# Patient Record
Sex: Female | Born: 1935 | Race: Black or African American | Hispanic: No | Smoking: Never smoker
Health system: Southern US, Community
[De-identification: ages and names within clinical notes are randomized; demographics above are authoritative.]

## PROBLEM LIST (undated history)

## (undated) DIAGNOSIS — E079 Disorder of thyroid, unspecified: Secondary | ICD-10-CM

## (undated) DIAGNOSIS — I10 Essential (primary) hypertension: Secondary | ICD-10-CM

## (undated) DIAGNOSIS — I739 Peripheral vascular disease, unspecified: Secondary | ICD-10-CM

## (undated) HISTORY — PX: CATARACT EXTRACTION: SUR2

## (undated) HISTORY — PX: APPENDECTOMY: SHX54

## (undated) HISTORY — PX: TONSILLECTOMY: SUR1361

## (undated) HISTORY — DX: Peripheral vascular disease, unspecified: I73.9

---

## 1998-10-29 ENCOUNTER — Other Ambulatory Visit: Admission: RE | Admit: 1998-10-29 | Discharge: 1998-10-29 | Payer: Self-pay | Admitting: Emergency Medicine

## 1999-02-20 ENCOUNTER — Encounter: Admission: RE | Admit: 1999-02-20 | Discharge: 1999-03-20 | Payer: Self-pay | Admitting: Orthopaedic Surgery

## 1999-06-16 ENCOUNTER — Inpatient Hospital Stay (HOSPITAL_COMMUNITY): Admission: EM | Admit: 1999-06-16 | Discharge: 1999-06-20 | Payer: Self-pay | Admitting: Emergency Medicine

## 1999-06-16 ENCOUNTER — Encounter: Payer: Self-pay | Admitting: Emergency Medicine

## 1999-10-27 ENCOUNTER — Encounter: Payer: Self-pay | Admitting: Emergency Medicine

## 1999-10-27 ENCOUNTER — Emergency Department (HOSPITAL_COMMUNITY): Admission: EM | Admit: 1999-10-27 | Discharge: 1999-10-27 | Payer: Self-pay | Admitting: Emergency Medicine

## 1999-10-28 ENCOUNTER — Other Ambulatory Visit: Admission: RE | Admit: 1999-10-28 | Discharge: 1999-10-28 | Payer: Self-pay | Admitting: Emergency Medicine

## 1999-10-31 ENCOUNTER — Encounter: Payer: Self-pay | Admitting: Emergency Medicine

## 1999-10-31 ENCOUNTER — Encounter: Admission: RE | Admit: 1999-10-31 | Discharge: 1999-10-31 | Payer: Self-pay | Admitting: Emergency Medicine

## 2000-06-02 ENCOUNTER — Inpatient Hospital Stay (HOSPITAL_COMMUNITY): Admission: AD | Admit: 2000-06-02 | Discharge: 2000-06-05 | Payer: Self-pay | Admitting: Emergency Medicine

## 2000-06-02 ENCOUNTER — Encounter: Payer: Self-pay | Admitting: Emergency Medicine

## 2000-06-03 ENCOUNTER — Encounter: Payer: Self-pay | Admitting: Emergency Medicine

## 2000-06-03 ENCOUNTER — Encounter: Payer: Self-pay | Admitting: General Surgery

## 2000-06-04 ENCOUNTER — Encounter: Payer: Self-pay | Admitting: Internal Medicine

## 2000-06-04 ENCOUNTER — Encounter: Payer: Self-pay | Admitting: Emergency Medicine

## 2000-06-21 ENCOUNTER — Encounter: Payer: Self-pay | Admitting: Emergency Medicine

## 2000-06-21 ENCOUNTER — Encounter: Admission: RE | Admit: 2000-06-21 | Discharge: 2000-06-21 | Payer: Self-pay | Admitting: Emergency Medicine

## 2000-06-23 ENCOUNTER — Encounter: Admission: RE | Admit: 2000-06-23 | Discharge: 2000-06-23 | Payer: Self-pay | Admitting: Emergency Medicine

## 2000-06-23 ENCOUNTER — Encounter: Payer: Self-pay | Admitting: Emergency Medicine

## 2000-10-28 ENCOUNTER — Other Ambulatory Visit: Admission: RE | Admit: 2000-10-28 | Discharge: 2000-10-28 | Payer: Self-pay | Admitting: Emergency Medicine

## 2000-11-01 ENCOUNTER — Encounter: Payer: Self-pay | Admitting: Emergency Medicine

## 2000-11-01 ENCOUNTER — Encounter: Admission: RE | Admit: 2000-11-01 | Discharge: 2000-11-01 | Payer: Self-pay | Admitting: Emergency Medicine

## 2001-11-02 ENCOUNTER — Encounter: Payer: Self-pay | Admitting: Emergency Medicine

## 2001-11-02 ENCOUNTER — Encounter: Admission: RE | Admit: 2001-11-02 | Discharge: 2001-11-02 | Payer: Self-pay | Admitting: Emergency Medicine

## 2002-02-13 ENCOUNTER — Encounter: Admission: RE | Admit: 2002-02-13 | Discharge: 2002-02-13 | Payer: Self-pay | Admitting: Emergency Medicine

## 2002-02-13 ENCOUNTER — Encounter: Payer: Self-pay | Admitting: Emergency Medicine

## 2002-03-23 ENCOUNTER — Encounter: Admission: RE | Admit: 2002-03-23 | Discharge: 2002-03-23 | Payer: Self-pay | Admitting: Gastroenterology

## 2002-03-23 ENCOUNTER — Encounter: Payer: Self-pay | Admitting: Gastroenterology

## 2002-04-12 ENCOUNTER — Ambulatory Visit (HOSPITAL_COMMUNITY): Admission: RE | Admit: 2002-04-12 | Discharge: 2002-04-12 | Payer: Self-pay | Admitting: Gastroenterology

## 2002-04-24 ENCOUNTER — Ambulatory Visit (HOSPITAL_COMMUNITY): Admission: RE | Admit: 2002-04-24 | Discharge: 2002-04-24 | Payer: Self-pay | Admitting: Gastroenterology

## 2002-08-02 ENCOUNTER — Ambulatory Visit (HOSPITAL_COMMUNITY): Admission: RE | Admit: 2002-08-02 | Discharge: 2002-08-02 | Payer: Self-pay | Admitting: Gastroenterology

## 2002-11-03 ENCOUNTER — Encounter: Payer: Self-pay | Admitting: Emergency Medicine

## 2002-11-03 ENCOUNTER — Encounter: Admission: RE | Admit: 2002-11-03 | Discharge: 2002-11-03 | Payer: Self-pay

## 2003-02-01 ENCOUNTER — Encounter: Admission: RE | Admit: 2003-02-01 | Discharge: 2003-02-01 | Payer: Self-pay | Admitting: Emergency Medicine

## 2003-02-01 ENCOUNTER — Encounter: Payer: Self-pay | Admitting: Emergency Medicine

## 2003-10-07 ENCOUNTER — Inpatient Hospital Stay (HOSPITAL_COMMUNITY): Admission: EM | Admit: 2003-10-07 | Discharge: 2003-10-09 | Payer: Self-pay | Admitting: Emergency Medicine

## 2003-10-25 ENCOUNTER — Encounter: Admission: RE | Admit: 2003-10-25 | Discharge: 2003-10-25 | Payer: Self-pay | Admitting: Emergency Medicine

## 2003-11-01 ENCOUNTER — Encounter: Admission: RE | Admit: 2003-11-01 | Discharge: 2003-11-01 | Payer: Self-pay | Admitting: Emergency Medicine

## 2004-11-14 ENCOUNTER — Encounter: Admission: RE | Admit: 2004-11-14 | Discharge: 2004-11-14 | Payer: Self-pay | Admitting: Emergency Medicine

## 2005-01-17 ENCOUNTER — Emergency Department (HOSPITAL_COMMUNITY): Admission: EM | Admit: 2005-01-17 | Discharge: 2005-01-17 | Payer: Self-pay | Admitting: Emergency Medicine

## 2005-04-14 ENCOUNTER — Encounter: Admission: RE | Admit: 2005-04-14 | Discharge: 2005-04-14 | Payer: Self-pay | Admitting: Emergency Medicine

## 2005-08-07 ENCOUNTER — Encounter: Admission: RE | Admit: 2005-08-07 | Discharge: 2005-08-07 | Payer: Self-pay | Admitting: Emergency Medicine

## 2006-01-25 ENCOUNTER — Encounter: Admission: RE | Admit: 2006-01-25 | Discharge: 2006-01-25 | Payer: Self-pay | Admitting: Emergency Medicine

## 2006-02-08 ENCOUNTER — Encounter: Admission: RE | Admit: 2006-02-08 | Discharge: 2006-02-08 | Payer: Self-pay | Admitting: Orthopedic Surgery

## 2006-02-10 ENCOUNTER — Ambulatory Visit (HOSPITAL_BASED_OUTPATIENT_CLINIC_OR_DEPARTMENT_OTHER): Admission: RE | Admit: 2006-02-10 | Discharge: 2006-02-11 | Payer: Self-pay | Admitting: Orthopedic Surgery

## 2006-07-01 ENCOUNTER — Encounter: Admission: RE | Admit: 2006-07-01 | Discharge: 2006-07-01 | Payer: Self-pay | Admitting: Emergency Medicine

## 2006-09-02 ENCOUNTER — Ambulatory Visit (HOSPITAL_BASED_OUTPATIENT_CLINIC_OR_DEPARTMENT_OTHER): Admission: RE | Admit: 2006-09-02 | Discharge: 2006-09-02 | Payer: Self-pay | Admitting: Orthopedic Surgery

## 2006-10-26 ENCOUNTER — Encounter: Admission: RE | Admit: 2006-10-26 | Discharge: 2006-10-26 | Payer: Self-pay | Admitting: Emergency Medicine

## 2006-11-02 ENCOUNTER — Encounter: Admission: RE | Admit: 2006-11-02 | Discharge: 2006-11-02 | Payer: Self-pay | Admitting: Emergency Medicine

## 2007-01-28 ENCOUNTER — Encounter: Admission: RE | Admit: 2007-01-28 | Discharge: 2007-01-28 | Payer: Self-pay | Admitting: Emergency Medicine

## 2007-02-18 ENCOUNTER — Encounter: Admission: RE | Admit: 2007-02-18 | Discharge: 2007-02-18 | Payer: Self-pay | Admitting: Emergency Medicine

## 2007-10-07 ENCOUNTER — Encounter: Admission: RE | Admit: 2007-10-07 | Discharge: 2007-10-07 | Payer: Self-pay | Admitting: Emergency Medicine

## 2007-10-28 ENCOUNTER — Encounter: Admission: RE | Admit: 2007-10-28 | Discharge: 2007-10-28 | Payer: Self-pay | Admitting: Emergency Medicine

## 2008-03-12 ENCOUNTER — Encounter: Admission: RE | Admit: 2008-03-12 | Discharge: 2008-03-12 | Payer: Self-pay | Admitting: Emergency Medicine

## 2008-03-28 ENCOUNTER — Encounter: Admission: RE | Admit: 2008-03-28 | Discharge: 2008-03-28 | Payer: Self-pay | Admitting: Emergency Medicine

## 2008-11-19 ENCOUNTER — Encounter: Admission: RE | Admit: 2008-11-19 | Discharge: 2008-11-19 | Payer: Self-pay | Admitting: Emergency Medicine

## 2009-04-17 ENCOUNTER — Encounter: Admission: RE | Admit: 2009-04-17 | Discharge: 2009-04-17 | Payer: Self-pay | Admitting: Family Medicine

## 2009-05-17 ENCOUNTER — Emergency Department (HOSPITAL_COMMUNITY): Admission: EM | Admit: 2009-05-17 | Discharge: 2009-05-17 | Payer: Self-pay | Admitting: Emergency Medicine

## 2009-05-27 ENCOUNTER — Encounter: Admission: RE | Admit: 2009-05-27 | Discharge: 2009-05-27 | Payer: Self-pay | Admitting: Family Medicine

## 2009-09-16 ENCOUNTER — Encounter: Admission: RE | Admit: 2009-09-16 | Discharge: 2009-09-16 | Payer: Self-pay | Admitting: Family Medicine

## 2010-10-17 ENCOUNTER — Encounter: Admission: RE | Admit: 2010-10-17 | Discharge: 2010-10-17 | Payer: Self-pay | Admitting: Family Medicine

## 2010-11-11 ENCOUNTER — Encounter
Admission: RE | Admit: 2010-11-11 | Discharge: 2010-11-11 | Payer: Self-pay | Source: Home / Self Care | Attending: Family Medicine | Admitting: Family Medicine

## 2010-12-06 ENCOUNTER — Encounter: Payer: Self-pay | Admitting: Emergency Medicine

## 2010-12-07 ENCOUNTER — Encounter: Payer: Self-pay | Admitting: Emergency Medicine

## 2011-03-02 ENCOUNTER — Emergency Department (HOSPITAL_COMMUNITY): Payer: Medicare Other

## 2011-03-02 ENCOUNTER — Inpatient Hospital Stay (HOSPITAL_COMMUNITY)
Admission: EM | Admit: 2011-03-02 | Discharge: 2011-03-09 | DRG: 558 | Disposition: A | Discharge status: Skilled Nursing Facility | Payer: Medicare Other | Attending: Internal Medicine | Admitting: Internal Medicine

## 2011-03-02 DIAGNOSIS — G252 Other specified forms of tremor: Secondary | ICD-10-CM | POA: Diagnosis present

## 2011-03-02 DIAGNOSIS — E119 Type 2 diabetes mellitus without complications: Secondary | ICD-10-CM | POA: Diagnosis present

## 2011-03-02 DIAGNOSIS — R112 Nausea with vomiting, unspecified: Secondary | ICD-10-CM | POA: Diagnosis present

## 2011-03-02 DIAGNOSIS — E039 Hypothyroidism, unspecified: Secondary | ICD-10-CM | POA: Diagnosis present

## 2011-03-02 DIAGNOSIS — M6282 Rhabdomyolysis: Principal | ICD-10-CM | POA: Diagnosis present

## 2011-03-02 DIAGNOSIS — E871 Hypo-osmolality and hyponatremia: Secondary | ICD-10-CM | POA: Diagnosis present

## 2011-03-02 DIAGNOSIS — E7800 Pure hypercholesterolemia, unspecified: Secondary | ICD-10-CM | POA: Diagnosis present

## 2011-03-02 DIAGNOSIS — G250 Essential tremor: Secondary | ICD-10-CM | POA: Diagnosis present

## 2011-03-02 DIAGNOSIS — E785 Hyperlipidemia, unspecified: Secondary | ICD-10-CM | POA: Diagnosis present

## 2011-03-02 DIAGNOSIS — R5381 Other malaise: Secondary | ICD-10-CM | POA: Diagnosis present

## 2011-03-02 LAB — COMPREHENSIVE METABOLIC PANEL
AST: 91 U/L — ABNORMAL HIGH (ref 0–37)
Albumin: 3.2 g/dL — ABNORMAL LOW (ref 3.5–5.2)
Alkaline Phosphatase: 47 U/L (ref 39–117)
BUN: 17 mg/dL (ref 6–23)
CO2: 20 mEq/L (ref 19–32)
Chloride: 96 mEq/L (ref 96–112)
Potassium: 3.8 mEq/L (ref 3.5–5.1)
Total Bilirubin: 0.7 mg/dL (ref 0.3–1.2)

## 2011-03-02 LAB — DIFFERENTIAL
Basophils Relative: 0 % (ref 0–1)
Eosinophils Absolute: 0.2 10*3/uL (ref 0.0–0.7)
Lymphocytes Relative: 17 % (ref 12–46)
Lymphs Abs: 0.9 10*3/uL (ref 0.7–4.0)
Monocytes Absolute: 0.6 10*3/uL (ref 0.1–1.0)
Neutro Abs: 3.5 10*3/uL (ref 1.7–7.7)

## 2011-03-02 LAB — CBC
MCH: 32.3 pg (ref 26.0–34.0)
MCHC: 35.7 g/dL (ref 30.0–36.0)
MCV: 90.4 fL (ref 78.0–100.0)
Platelets: 112 10*3/uL — ABNORMAL LOW (ref 150–400)
RBC: 4.58 MIL/uL (ref 3.87–5.11)

## 2011-03-02 LAB — POCT CARDIAC MARKERS: Troponin i, poc: <0.05 ng/mL (ref 0.00–0.09)

## 2011-03-03 LAB — URINALYSIS, ROUTINE W REFLEX MICROSCOPIC
Bilirubin Urine: NEGATIVE
Glucose, UA: NEGATIVE mg/dL
Leukocytes, UA: NEGATIVE
Nitrite: NEGATIVE
Protein, ur: 30 mg/dL — AB
Specific Gravity, Urine: 1.024 (ref 1.005–1.030)
Urobilinogen, UA: 0.2 mg/dL (ref 0.0–1.0)
pH: 5.5 (ref 5.0–8.0)

## 2011-03-03 LAB — URINE MICROSCOPIC-ADD ON

## 2011-03-03 LAB — CARDIAC PANEL(CRET KIN+CKTOT+MB+TROPI)
CK, MB: 5.4 ng/mL — ABNORMAL HIGH (ref 0.3–4.0)
CK, MB: 6.4 ng/mL (ref 0.3–4.0)
CK, MB: 6.2 ng/mL (ref 0.3–4.0)
Relative Index: 0.2 (ref 0.0–2.5)
Relative Index: 0.3 (ref 0.0–2.5)
Total CK: 2093 U/L — ABNORMAL HIGH (ref 7–177)
Troponin I: 0.03 ng/mL (ref 0.00–0.06)

## 2011-03-03 LAB — GLUCOSE, CAPILLARY
Glucose-Capillary: 139 mg/dL — ABNORMAL HIGH (ref 70–99)
Glucose-Capillary: 165 mg/dL — ABNORMAL HIGH (ref 70–99)
Glucose-Capillary: 150 mg/dL — ABNORMAL HIGH (ref 70–99)

## 2011-03-04 LAB — BASIC METABOLIC PANEL
CO2: 20 mEq/L (ref 19–32)
Chloride: 105 mEq/L (ref 96–112)
GFR calc Af Amer: >60 mL/min (ref >60)
Glucose, Bld: 174 mg/dL — ABNORMAL HIGH (ref 70–99)
Sodium: 132 mEq/L — ABNORMAL LOW (ref 135–145)

## 2011-03-04 LAB — GLUCOSE, CAPILLARY

## 2011-03-04 LAB — CBC
HCT: 36.7 % (ref 36.0–46.0)
Hemoglobin: 12.6 g/dL (ref 12.0–15.0)
MCH: 31.3 pg (ref 26.0–34.0)
RBC: 4.02 MIL/uL (ref 3.87–5.11)

## 2011-03-04 LAB — ANA: Anti Nuclear Antibody(ANA): POSITIVE — AB

## 2011-03-04 LAB — URINE CULTURE: Culture: NO GROWTH

## 2011-03-04 LAB — ANTI-NUCLEAR AB-TITER (ANA TITER): ANA Titer 1: NEGATIVE

## 2011-03-05 LAB — GLUCOSE, CAPILLARY: Glucose-Capillary: 172 mg/dL — ABNORMAL HIGH (ref 70–99)

## 2011-03-05 LAB — BASIC METABOLIC PANEL
Chloride: 112 mEq/L (ref 96–112)
GFR calc Af Amer: >60 mL/min (ref >60)
Potassium: 4.4 mEq/L (ref 3.5–5.1)
Sodium: 136 mEq/L (ref 135–145)

## 2011-03-05 LAB — CK TOTAL AND CKMB (NOT AT ARMC)
CK, MB: 3.3 ng/mL (ref 0.3–4.0)
Relative Index: 0.7 (ref 0.0–2.5)
Total CK: 496 U/L — ABNORMAL HIGH (ref 7–177)

## 2011-03-06 LAB — GLUCOSE, CAPILLARY
Glucose-Capillary: 146 mg/dL — ABNORMAL HIGH (ref 70–99)
Glucose-Capillary: 124 mg/dL — ABNORMAL HIGH (ref 70–99)

## 2011-03-06 LAB — HEMOGLOBIN A1C: Hgb A1c MFr Bld: 8 % — ABNORMAL HIGH (ref <5.7)

## 2011-03-06 NOTE — H&P (Signed)
[name] Colon, TRILLO NO.:  [id number]  MEDICAL RECORD NO.:  [id number]           PATIENT TYPE:  E  LOCATION:  WLED                         FACILITY:  [facility]  PHYSICIAN:  Houston Siren, MD           DATE OF BIRTH:  [birth year]  DATE OF ADMISSION:  03/02/2011 DATE OF DISCHARGE:                             HISTORY & PHYSICAL   PRIMARY CARE PHYSICIAN:  Maryelizabeth Rowan, MD  ADVANCE DIRECTIVES:  Full code.  REASON FOR ADMISSION:  Feeling weak.  HISTORY OF PRESENT ILLNESS:  This is an 77 year old female with history of diabetes, hypothyroidism, hypercholesterolemia, and tremors, who presents to the emergency room at [facility] with nausea, vomiting, feeling weak.  Apparently, she saw her primary care physician and was placed on 2 antibiotics, the latest one is Bactrim.  She could not tell the reason as to why she was on antibiotic and reports that she was not told what infection she had.  She had been feeling more weak with malaise, decreased in appetite, having nausea and vomiting since she started taking the Bactrim.  She denied any headache, sore throat, earache, abdominal cramps or pain, diarrhea, or any dysuria.  Evaluation in the emergency room showed a serum sodium of 128, potassium of 3.8, blood sugar of 116, creatinine of 1.01.  Her urinalysis is negative, and her chest x- ray showed no infiltrate.  Furthermore, she has normal white count of 5200 and a hemoglobin of 14.8.  Her EKG showed Q-waves in II, III, and aVF.  I noted that her platelet count is 112,000.  Hospitalist was asked to admit the patient because of her feeling weak, having nausea.  PAST MEDICAL HISTORY:  Diabetes, hypothyroidism, hypercholesterolemia, tremor.  CURRENT MEDICATIONS: 1. Simvastatin 20 mg once a day. 2. Bactrim. 3. Levothyroxine 88 mcg per day. 4. Gabapentin. 5. Potassium chloride. 6. Torsemide. 7. Primidone 250 mg b.i.d. 8. Colace.  REVIEW OF SYSTEMS:  Otherwise,  unremarkable.  SOCIAL HISTORY:  She is not a smoker.  She denied any alcohol use.  Her son lives with her.  PHYSICAL EXAMINATION:  VITAL SIGNS:  Blood pressure 130/46, pulse of 95, respiratory rate of 18, temperature 101.7. GENERAL:  She is alert and oriented and is in no apparent distress. HEENT:  She has facial asymmetry and her speech is fluent.  Tongue is midline. NECK:  No cervical adenopathy.  Neck is supple.  No stridor. CARDIAC:  Revealed S1 and S2, regular.  I did not hear any murmur, rub, or gallop. LUNGS:  Clear.  No wheezes, rales, or any evidence of consolidation. ABDOMEN:  Soft, nondistended, nontender.  No rebound.  Bowel sounds present. EXTREMITIES:  With no edema.  No calf tenderness.  She has good distal pulses bilaterally. SKIN:  Slightly moist. NEUROLOGIC:  Nonfocal. PSYCHIATRIC:  Unremarkable as well.  OBJECTIVE FINDINGS:  EKG shows sinus rhythm with Q-waves in inferior leads.  Serum sodium 128, potassium 3.8, glucose of 160, BUN of 17, creatinine 1.01, calcium of 8.0.  White count of 5200, hemoglobin of 14.8, MCV of 91, platelet count of 112,000.  Urinalysis is negative. Chest  x-ray showed no definite infiltrate.  IMPRESSION:  This is an 77 year old female with feeling malaise, clinically dehydrated, having nausea and vomiting after taking Bactrim. I think that it is possible she has side effects from the Bactrim, manifested with nausea, vomiting, decreased in appetite, and decreased in platelet count.  She will need intravenous fluid.  We will hold off on any other antibiotic for now.  Because of the the EKG showing inferior Q- waves, we will cycle her cardiac enzymes as well.  We will continue her Synthroid supplement and check her TSH.  I would like to continue all her medications except for torsemide and Bactrim once they are reconciled.  She is a full code and will be admitted to [facility].  PROBLEM LIST: 1. Nausea, vomiting, question of side effect from  Bactrim. 2. Dehydration. 3. Malaise. 4. Diabetes. 5. Hypothyroidism. 6. Tremor. 7. Hypercholesterolemia. 8. Abnormal EKG (inferior Qs). 9. Borderline thrombocytopenia. 10.Full code.     Houston Siren, MD     PL/MEDQ  D:  03/03/2011  T:  03/03/2011  Job:  [id number]  Electronically Signed by Houston Siren  on 03/06/2011 05:14:35 AM

## 2011-03-07 LAB — GLUCOSE, CAPILLARY: Glucose-Capillary: 116 mg/dL — ABNORMAL HIGH (ref 70–99)

## 2011-03-08 LAB — GLUCOSE, CAPILLARY
Glucose-Capillary: 129 mg/dL — ABNORMAL HIGH (ref 70–99)
Glucose-Capillary: 184 mg/dL — ABNORMAL HIGH (ref 70–99)

## 2011-03-09 LAB — GLUCOSE, CAPILLARY
Glucose-Capillary: 138 mg/dL — ABNORMAL HIGH (ref 70–99)
Glucose-Capillary: 120 mg/dL — ABNORMAL HIGH (ref 70–99)

## 2011-03-14 NOTE — Discharge Summary (Signed)
NAME:  [name] Colon, [name] Colon             ACCOUNT NO.:  [id number]  MEDICAL RECORD NO.:  [id number]           PATIENT TYPE:  I  LOCATION:  1430                         FACILITY:  [facility]  PHYSICIAN:  Kathlen Mody, MD       DATE OF BIRTH:  10/09/1920  DATE OF ADMISSION:  03/02/2011 DATE OF DISCHARGE:  03/06/2011                        DISCHARGE SUMMARY - REFERRING   DISCHARGE DIAGNOSES: 1. Generalized weakness secondary to rhabdomyolysis. 2. Hyperlipidemia. 3. Hypothyroidism. 4. Diabetes mellitus. 5. Essential tremors. 6. Hyponatremia.  DISCHARGE MEDICATIONS: 1. Tylenol 500 mg 1 tablet q.6 h as needed. 2. Gabapentin 100 mg p.o. at bedtime. 3. Primidone 250 mg p.o. twice a day. 4. Colace 100 mg twice a day. 5. Torsemide 1 tablet daily. 6. Potassium 20 mEq 1 tablet daily. 7. Levothyroxine 88 mcg 1 tablet daily. 8. Metformin 500 mg daily.  PERTINENT LABS:  On admission, the patient had a comprehensive metabolic panel significant for a sodium of 128, potassium of 3.8, chloride of 96, bicarb of 20, glucose of 160, BUN 7, creatinine 1.01. Alkaline phosphatase 47, AST of 91, ALT of 42. CBC significant for platelets of 112. Urinalysis showed negative nitrites and leukocytes. Creatine kinase of 2372.  CK-MB of 5.4.  TSH within normal limits. CK level is 1862. Urine culture is negative. On the day of discharge, the patient's sodium is 136, potassium is 4.4, chloride 112, bicarb 23, BUN of 7, creatinine of 0.49. CK of 496, CK-MB of 3.3.  Hemoglobin A1c of 8.  RADIOLOGY:  The patient had an x-ray which showed COPD/emphysema, no acute cardiopulmonary disease.  A new nodule in the medial right lung apex.  A CT chest with contrast was suggested in the future for further evaluation.  BRIEF HOSPITAL COURSE:  This is an 77 year old lady with history of diabetes, hyperlipidemia, who was admitted for generalized weakness and nausea and vomiting.  Nausea and vomiting were probably secondary to  the Bactrim that she was taking for reasons unknown.  Over the course of her hospitalization, nausea and vomiting have resolved.  The patient was started on IV fluids.  She was not given any antibiotics.  The urine was checked for any infection.  Urine culture came back negative.  Generalized weakness, most likely secondary to dehydration giving rise to rhabdomyolysis.  She was on IV fluids.  Her creatine kinase has dropped from 2000 to 400s and her weakness has improved dramatically.  Hypothyroidism:  Her TSH was within normal limits and she was continued on her Synthroid supplements.  Hyponatremia, most likely secondary to dehydration:  Dehydration and hyponatremia have resolved after IV fluids were given.  Diabetes:  The patient is not on any antidiabetic medications at this time.  She was on sliding scale while she was in the hospital.  She will be started on metformin 500 mg daily.  Tremors:  The patient is on primidone; we will continue the same.  Hypercholesterolemia/hyperlipidemia:  The patient was on simvastatin which was stopped secondary to rhabdomyolysis, which can be restarted later in the future by the patient's PCP.  Abnormal chest x-ray:  A nodule in the medial right lung apex.  CT  of the chest with contrast will have to be done in the future for further evaluation as outpatient.  PHYSICAL EXAM ON DAY OF DISCHARGE:  VITAL SIGNS:  Include temperature of 97.9, pulse of 78 per minute, respirations 18 per minute, blood pressure 157/72, saturating 96% on room air.  GENERAL:  She is alert, afebrile, comfortable in no acute distress.  CARDIOVASCULAR: S1, S2 heard. RESPIRATORY:  Good air entry bilateral.  ABDOMEN:  Soft, nontender, nondistended.  EXTREMITIES: No pedal edema.  NEUROLOGIC:  Nonfocal at this time.  DISPOSITION:  The patient is hemodynamically stable for discharge to short-term SNF.  FOLLOWUP:  With PCP in 1 to 2 weeks or MD physician at SNF.  Follow  up the nodule in the lung with a CT of the chest with contrast for further evaluation as outpatient at PCP.          ______________________________ Kathlen Mody, MD     VA/MEDQ  D:  03/06/2011  T:  03/06/2011  Job:  [id number]  Electronically Signed by Kathlen Mody MD on 03/14/2011 01:31:31 PM

## 2011-03-31 ENCOUNTER — Other Ambulatory Visit: Payer: Self-pay | Admitting: Nurse Practitioner

## 2011-03-31 DIAGNOSIS — R911 Solitary pulmonary nodule: Secondary | ICD-10-CM

## 2011-04-03 NOTE — Procedures (Signed)
[place]  Patient:    [name] Colon, [name] Colon Visit Number: [id number] MRN: [id number]          Service Type: END Location: ENDO Attending Physician:  Charna Wendelin Dictated by:   Anselmo Rod, M.D. Proc. Date: 04/12/02 Admit Date:  04/12/2002   CC:         Reuben Likes, M.D.   Procedure Report  DATE OF BIRTH:  December 18, 1919  REFERRING PHYSICIAN:  Reuben Likes, M.D.  PROCEDURE PERFORMED:  Esophagogastroduodenoscopy.  ENDOSCOPIST:  Anselmo Rod, M.D.  INSTRUMENT USED:  Olympus video panendoscope.  INDICATIONS FOR PROCEDURE:  Epigastric pain with retrosternal regurgitation and discomfort in an 77 year old African-American female with a normal barium swallow.  Rule out peptic ulcer disease, esophagitis, gastritis, etc.  PREPROCEDURE PREPARATION:  Informed consent was procured from the patient. The patient was fasted for eight hours prior to the procedure.  PREPROCEDURE PHYSICAL:  The patient had stable vital signs.  Neck supple. Chest clear to auscultation.  S1, S2 regular.  Abdomen soft with normal abdominal bowel sounds.  DESCRIPTION OF PROCEDURE:  The patient was placed in left lateral decubitus position and sedated with 40 mg of Demerol and 5 mg of Versed intravenously. Once the patient was adequately sedated and maintained on low-flow oxygen and continuous cardiac monitoring, the Olympus video panendoscope was advanced through the mouthpiece, over the tongue, into the esophagus under direct vision.  The entire esophagus appeared normal and without lesion.  On advancing the scope into the stomach.  There was a small hiatal hernia seen on high retroflexion.  The rest of the gastric mucosa and the proximal small bowel appeared normal.  IMPRESSION:  Normal esophagogastroduodenoscopy except for small hiatal hernia.  RECOMMENDATION: 1. A 24 hour pH study and esophageal manometry will be done. 2. Patient is to discontinue use of  Prevacid as this has caused diarrhea and    not helped her symptoms at all. 3. Further recommendations made after the above-mentioned tests have been    done. Dictated by:   Anselmo Rod, M.D. Attending Physician:  Charna Adaora DD:  04/12/02 TD:  04/13/02 Job: [id number]

## 2011-04-03 NOTE — Op Note (Signed)
NAME:  [name] Colon, [name] Colon             ACCOUNT NO.:  [id number]   MEDICAL RECORD NO.:  [id number]          PATIENT TYPE:  AMB   LOCATION:  DSC                          FACILITY:  [facility]   PHYSICIAN:  Dyke Brackett, M.D.    DATE OF BIRTH:  Dec 14, 1919   DATE OF PROCEDURE:  02/10/2006  DATE OF DISCHARGE:  02/10/2006                                 OPERATIVE REPORT   PREOPERATIVE DIAGNOSES:  1.  Complete interstitial tear infraspinatus.  2.  Impingement.  3.  Acromioclavicular joint arthritis.  4.  Degenerative tearing, anterior superior labrum and early degenerative      change glenohumeral joint.   POSTOPERATIVE DIAGNOSES:  1.  Complete interstitial tear infraspinatus.  2.  Impingement.  3.  Acromioclavicular joint arthritis.  4.  Degenerative tearing, anterior superior labrum and early degenerative      change glenohumeral joint.   OPERATION PERFORMED:  1.  Arthroscopic acromioplasty.  2.  Arthroscopic debridement, torn labrum.  3.  Arthroscopic excision, distal clavicle.  4.  Mini open rotator cuff repair.   SURGEON:  Dyke Brackett, M.D.   ASSISTANT:  Arlys John D. Petrarca, P.A.-C.   ESTIMATED BLOOD LOSS:  Minimal.   ANESTHESIA:   INDICATIONS FOR PROCEDURE:  She is an 77 year old with MRI proven cuff tear  thought to be amenable to outpatient surgery.   DESCRIPTION OF PROCEDURE:  Arthroscope through posterior, lateral and  anterior portal.  On systematic inspection of the shoulder, I initially  thought that there was no complete cuff tear although there was a  longitudinal split in the infraspinatus.  There was a lot of friable  synovium in the shoulder which was debrided and early degenerative change  which was debrided.  Biceps tendon anchor intact.  There was a partial  actually subscapularis tear which was debrided as well with some  calcification of the subscapularis or crystalline material.   Subacromial space was hypertrophied and inflamed with moderately severe  impingement which was relieved with laparoscopic acromioplasty.  There was  severe acromioclavicular arthritis that was relieved with 1 to 1.5 cm distal  excision.  The superior surface of the cuff appeared to be intact on the  anterior leading edge with MRI report but there was a longitudinal split  that was visible at the conclusion of the bony procedures and a mini open  rotator cuff  repair done for enlargement of the lateral portal with placement of two #2  FiberWires for repair of the longitudinal tear.  Good snug repair was  obtained.  Closure was effected with 0 and 2-0 Vicryl.  Portals were closed  with nylon and lightly compressive sterile dressing applied.  Marcaine  infiltrated in the wound.      Dyke Brackett, M.D.  Electronically Signed     WDC/MEDQ  D:  02/10/2006  T:  02/12/2006  Job:  [id number]

## 2011-04-03 NOTE — Op Note (Signed)
[name], [name] Colon NO.:  [id number]   MEDICAL RECORD NO.:  [id number]          PATIENT TYPE:  AMB   LOCATION:  DSC                          FACILITY:  [facility]   PHYSICIAN:  Loreta Ave, M.D. DATE OF BIRTH:  1920/02/14   DATE OF PROCEDURE:  09/02/2006  DATE OF DISCHARGE:                                 OPERATIVE REPORT   PREOPERATIVE DIAGNOSES:  Left shoulder persistent impingement, status post  rotator cuff repair with recurrent tearing, loose bioabsorbable anchors and  sutures.   POSTOPERATIVE DIAGNOSES:  Left shoulder persistent impingement, status post  rotator cuff repair with recurrent tearing, loose bioabsorbable anchors and  sutures, with not complete, but still recurrent partial cuff tear; marked  postoperative adhesions; persistent anterior bony impingement from acromion;  long head biceps tendon chronically absent; grade 2 changes of glenoid and  humerus.   PROCEDURES:  Shoulder exam under anesthesia, arthroscopy, debridement of  glenohumeral joint, subacromial decompression with extensive lysis and  debridement of adhesions, removal of two retained FiberWire sutures and  remnants of anchor, debridement of recurrent extensive, at least partial-  thickness cuff tear, revision of acromioplasty.   SURGEON:  Loreta Ave, M.D.   ASSISTANT:  Genene Churn. Denton Meek.   ANESTHESIA:  General.   BLOOD LOSS:  Minimal.   SPECIMENS:  None.   CULTURES:  None.   COMPLICATIONS:  None.   DRESSING:  Soft compressive sling.   DESCRIPTION OF PROCEDURES:  The patient was brought to the operating room  and placed on the operating table in supine position.  After adequate  anesthesia had been obtained, the left shoulder examined.  Very tight, very  stiff, but I could gastric tube it through full motion.  No instability.  Placed in a beach-chair position in a shoulder positioner and prepped and  draped in the usual sterile fashion.  Three portals  created, anterior,  posterior and lateral.  Shoulder entered with a blunt obturator and  distended.  Arthroscope introduced and the shoulder inspected.  Some grade 2  and 3 changes, mostly on the humerus, debrided.  Long head of biceps  completely absent; this was chronic, not acute.  Reactive synovitis and some  adhesions debrided.  Reestablishing inferior recess.  Labrum intact.  Undersurface of the cuff still had a capsular layer intact over the crescent  region, and the cable was still anchored.  Not a full-thickness tear from  below, but very significant partial-thickness.  Cannula redirected  subacromially.  Marked adhesions throughout.  A lot of time spent just  clearing up adhesions to identify structures.  The 2 retained FiberWire  sutures, which had ripped off the humerus, were freed up and had all soft  tissue removed, with a few little small fragments of a bioabsorbable anchor.  No other free anchor seen within the whole space.  The humeral tuberosity  well identified.  The cuff had a functional recurrent full-thickness tear,  but there was still a capsular layer intact over the entire crescent, and  the cable was still attached.  Given the previous history, as well as the  patient's  age, I felt debridement was the best approach to take at this  point in time.  Acromion was identified, and there was recurrent spurring  anteriorly.  Revision acromioplasty to a flat, upturned acromion in the  front, taking a little off the front to prevent bony impingement in the  front and rerelease the CA ligament.  Distal clavicle adequate excision.  A  little prominence of the acromion at the [facility] joint smoothed off with a bur.  At completion, the entire subacromial space accessed, viewed from all  portals to be sure all debris, the anchors and FiberWire removed.  The cuff  assessed, and, again, did not feel that further attempts at open repair  should the eye done.  Thoroughly irrigated.   Instruments and fluid removed.  Portals were closed with nylon.  Sterile compressive dressing applied.  Sling applied.  Anesthesia reversed.  Brought to the recovery room.  Tolerated surgery well with no complications.      Loreta Ave, M.D.  Electronically Signed     DFM/MEDQ  D:  09/02/2006  T:  09/03/2006  Job:  [id number]

## 2011-04-03 NOTE — Discharge Summary (Signed)
[place]. [facility]  Patient:    [name] Colon, [name] Colon                      MRN: [id number] Adm. Date:  [id number] Attending:  Roque Lias                           Discharge Summary  No dictation. DD:  06/02/00 TD:  06/03/00 Job: [id number]

## 2011-04-03 NOTE — Discharge Summary (Signed)
[place]. [facility]  Patient:    [name] Colon, [name] Colon                      MRN: [id number] Adm. Date:  [id number] Disc. Date: [id number] Attending:  Roque Lias                           Discharge Summary  SUMMARY OF HISTORY AND PHYSICAL:  The patient is a 77 year old female who has a four-day history of severe (9/10) stabbing right lower quadrant pain radiating to the right anterior thigh.  This has been associated with chills, nausea and frequent urination but no fever, vomiting, dysuria, hematuria, diarrhea, blood in the stool or GYN symptoms.  The patient stated that the pain was worse when lying down and somewhat better when she would sit up or stand up.  The pain got a little bit better after the onset but then is worse again. She does have a history of kidney stones and diverticulitis and has had an appendectomy.  A urinalysis in our office was within normal limits.  PHYSICAL EXAMINATION:  GENERAL APPEARANCE:  She was alert, but moaning and rocking with discomfort.  VITAL SIGNS:  Blood pressure 132/80, pulse 80 and regular, respiratory rate 20, temperature 97.0.  SKIN:  Clear.  HEENT:  Pupils were pinpoint after having received a dose of morphine and did not react well to light.  Fundi could not be seen.  ENT was normal. NECK:  There was no cervical adenopathy.  Thyroid was normal.  LUNGS:  Clear.  CARDIOVASCULAR:  Regular rhythm without gallop or murmur.  ABDOMEN:  She has mild right CVA tenderness.  There was moderate generalized tenderness with guarding over the entire abdomen with no localizing tenderness to palpation or rebound tenderness.  There was no hepatosplenomegaly or mass. Bowel sounds were hyperactive.  RECTAL:  Examination revealed no masses or tenderness and stool was Hemoccult negative.  EXTREMITIES:  She had no edema or calf tenderness. Pulses were full.  NEUROLOGICAL:  Examination was within normal  limits.  ADMISSION IMPRESSION: 1. Right lower quadrant pain possibly due to diverticulitis, kidney stone,    urinary tract infection, ovarian cyst or tumor, bowel obstruction,    gastroenteritis, ischemic bowel or aneurysm. 2. History of hypothyroidism. 3. History of essential tremor.  SUMMARY OF LABS:  Doppler venous examination was negative.  EKG showed some sinus tachycardia and nonspecific ST and T-wave changes. Follow-up tracings on June 04, 2000, were unchanged.  A ventilation perfusion lung scan showed some patchy subsegmental perfusion defects all of which were not definitely matched with ventilation defects.  Acute abdominal series showed numerous diverticula but no free air.  Pelvic ultrasound showed some anterior fundal calcifications and anterior fundal fibroid but ovaries were not seen.  A CT of the abdomen and pelvis showed small calcified uterine fibroids, otherwise normal.  A chest x-ray showed COPD.  CBC showed hemoglobin 15.3, white count 4500. Sodium was 132, glucose was 125, otherwise CMET was normal.  CPK/MB and troponins were negative x 2.  TSH was normal at 0.667.  Urinalysis was negative.  Blood cultures were negative. Urine showed multiple species.  HOSPITAL COURSE:  The patient was hospitalized on the 6700 floor.  Vital signs were obtained every four hours. She was allowed to be up ad lib, made NPO, given D5 0.25 normal saline with 20 mEq of KCl of 100 cc an hour.  She was  begun on cefotetan 2 g IV every 12 hours, morphine for pain and Phenergan for nausea and vomiting.  She was seen in consultation by Lorne Skeens. Hoxworth, M.D.  She continued to complain of abdominal pain.  The pain was to the suprapubic area without radiation.  She had a few loose stools after the CT contrast. Dr. Johna Sheriff did not feel that she had a surgical abdomen.  On June 04, 2000, her abdominal pain was a little bit better and on that date she noted a vesicular rash in the  area of distribution of the pain making the diagnosis of shingles most likely.  She did, however, report a 30 minute episode of substernal chest pain without radiation at around midnight the night before with no associated nausea, diaphoresis or dyspnea.  Her CPK/MBs and troponins were negative and her EKGs were unremarkable.  She was started on Valtrex and Cefotan was discontinued.  On June 05, 2000, she complained of pain in her right anterior thigh in the area of shingles.  She denied abdominal or chest pain.  It was thought that she could be discharged on that date in improved condition.  FINAL DIAGNOSES: 1. Shingles. 2. Chest pain probably reflux. 3. History of hypothyroidism. 4. History of essential tremor.  MEDICATIONS AT DISCHARGE: 1. Valtrex 1000 mg t.i.d. 2. Percocet 5-325 every four hours as needed. 3. Dermoplast spray to the area of shingles as needed. 4. Synthroid 88 mcg once a day. 5. Mysoline 250 mg one half tablet b.i.d. 6. Pletal. 7. Actonel 5 mg once a day.  The patient is to hold this for a week.  ACTIVITY:  No restrictions.  DIET:  No restrictions.  FOLLOW-UP:  She is to see me in a week. DD:  06/26/00 TD:  06/28/00 Job: [id number]

## 2011-04-03 NOTE — Op Note (Signed)
NAME:  [name] Colon, [name] Colon                       ACCOUNT NO.:  [id number]   MEDICAL RECORD NO.:  [id number]                   PATIENT TYPE:  AMB   LOCATION:  ENDO                                 FACILITY:  [facility]   PHYSICIAN:  Charna Carmin, M.D.                   DATE OF BIRTH:  1920/03/21   DATE OF PROCEDURE:  08/02/2002  DATE OF DISCHARGE:                                 OPERATIVE REPORT   PROCEDURE:  Screening colonoscopy.   ENDOSCOPIST:  Charna Zaineb, M.D.   INSTRUMENT USED:  Pediatric adjustable Olympus colonoscope.   INDICATION FOR PROCEDURE:  An 77 year old African-American female undergoing  screening colonoscopy.  Rule out colonic polyps, masses, etc.   PREPROCEDURE PREPARATION:  Informed consent was procured from the patient.  The patient was fasted for eight hours prior to the procedure and prepped  with a bottle of magnesium citrate and a gallon of NuLytely the night prior  to the procedure.   PREPROCEDURE PHYSICAL:  VITAL SIGNS:  The patient had stable vital signs.  NECK:  Supple.  CHEST:  Clear to auscultation.  S1, S2 regular.  ABDOMEN:  Soft with normal bowel sounds.   DESCRIPTION OF PROCEDURE:  The patient was placed in the left lateral  decubitus position and sedated with 70 mg of Demerol and 6 mg of Versed  intravenously.  Once the patient was adequately sedate and maintained on low-  flow oxygen and continuous cardiac monitoring, the Olympus video colonoscope  was advanced from the rectum to the cecum without difficulty.  The patient  had evidence of scattered diverticulosis.  There were some large diverticula  with inspissated stool in several of the diverticular pockets.  Small  internal hemorrhoids were seen on retroflexion.  These were not bleeding at  the time of examination.  The procedure was complete up to the cecum.  The  appendiceal orifice and ileocecal valve were clearly visualized and  photographed.  No masses or polyps were seen.    IMPRESSION:  1. Scattered diverticulosis with stool in some of the diverticular pockets.  2. Small, nonbleeding internal hemorrhoids.   RECOMMENDATIONS:  1. A high-fiber diet has been recommended for the patient, and information     on diverticulosis has been handed to her for her education.  2.     Outpatient follow-up is advised on a p.r.n. basis.  3. Repeat colorectal cancer screening is recommended in the next 10 years     unless the patient develops any abnormal symptoms in the interim.                                               Charna Britanny, M.D.    JM/MEDQ  D:  08/02/2002  T:  08/02/2002  Job:  [id number]   cc:  Reuben Likes, M.D.

## 2011-04-03 NOTE — H&P (Signed)
NAME:  [name] Colon, [name] Colon                       ACCOUNT NO.:  [id number]   MEDICAL RECORD NO.:  [id number]                   PATIENT TYPE:  INP   LOCATION:  1844                                 FACILITY:  [facility]   PHYSICIAN:  Ara D. Tammi Klippel, M.D.                DATE OF BIRTH:  Aug 24, 1920   DATE OF ADMISSION:  10/07/2003  DATE OF DISCHARGE:                                HISTORY & PHYSICAL   PRIMARY CARE PHYSICIAN:  Dr. Lorenz Coaster.   CHIEF COMPLAINT:  Chest pain.   HISTORY OF PRESENT ILLNESS:  Patient is a very pleasant 77 year old African-  American female who presents to the [facility] emergency room with  a two day history of intermittent chest pain.  This pain began on October 06, 2003 in the morning.  It was not associated with activity.  It occurred  10-15 times over the course of the day on November 20th, lasting for  approximately 20 minutes for each episode before resolving complete.  This  pattern continues throughout today on October 07, 2003.  Since her symptoms  were not abating, the patient came in for further evaluation.  She states  that the only thing that makes her pain worse is drinking water.  There are  no known palliative factors, although the patient is currently now chest  pain free.  She describes it as though she got punched in the chest, and  the pain is located within the mid sternum without radiation to her arms,  neck, jaw, or infrascapular region.  She states that the pain was most  severe, rated it as an 8-9/10.  It is currently at 0/10.  Would last for up  to 20 minutes at a time.  This pain is not associated with shortness of  breath, dysphagia, nausea, abdominal pain, or pleuritic chest pain.   ALLERGIES:  1. PENICILLIN, which leads to hives.  2. ASPIRIN, which causes GI upset.   MEDICATIONS:  1. Synthroid 88 mcg p.o. q.d.  2. Methotrexate 2.5 mg p.o. on Wednesday.  3. Folate 1 mg p.o. q.d.  4. Primidone 125 mg p.o. b.i.d.  5. Glipizide  XL 5 mg p.o. q.d.  6. Multivitamin.  7. Calcium with vitamin D 1 tab p.o. q.d.   Patient denies the ingestion of any herbal medications, supplements, or  remedies.   PAST MEDICAL HISTORY:  1. Diabetes mellitus Type II.  2. Rheumatoid arthritis.  3. Hypothyroidism.  4. Hand tremor.  5. History of diverticulosis.  6. Hiatal hernia.   PAST SURGICAL HISTORY:  1. Status post bilateral cataract repair.  2. Status post T&A in 1928.  3. Status post appendectomy in 1940.  4. Normal colonoscopy in September, 2003.  5. Normal EGD in May, 2003.   SOCIAL HISTORY:  Patient has an approximately 20-pack-year smoking history;  however, quit in 1975.  Denies any alcohol, illicit or IV drug use.  She  currently lives in [place] with her son; however, he is not present all  of the time.  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  They use  city water.   FAMILY HISTORY:  Mother is deceased at age 29 from CHF.  Father deceased at  age 72 from COPD.  Sister deceased at age 35 from breast cancer.  Sister  alive at age 106 with breast cancer.  Sister alive at age 11 with lymphoma.  Sister alive at age 52 with glaucoma.  Brother deceased at age 35 from  complications of measles.  Brother deceased at age 5 from an unknown  cancer.   REVIEW OF SYSTEMS:  Patient admits to sore throat, intermittent  claudication, and finger arthralgias.  Patient denies headache, visual  acuity changes, infarce's or scotoma, diplopia, auditory acuity changes,  vertigo, epistaxis, oral lesions, ulcers, dysphagia, or odynophagia.  Shortness of breath at rest or upon exertion, orthopnea, PND, hemoptysis,  exposure to people with TB, cough, or wheezing, chest pain on exertion,  nausea, vomiting, diarrhea, hematemesis, bright red blood per rectum,  melena, dysuria, polyuria, or hematuria, fevers, chills, sweats, lower  extremity edema, myalgias, weight change.   PHYSICAL EXAMINATION:  VITAL SIGNS:  T max 98.0, T  current 97.2, pulse 65-  94, respirations 18, BP 141/58, pulse ox 95% on two liters nasal cannula.  GENERAL:  Well-developed and well-nourished 77 year old African-American  female speaking in full sentences in no apparent distress.  HEENT:  Head is normocephalic and atraumatic without alopecia.  Eyes:  Postsurgical pupils reactive to light.  Extraocular muscles are intact.  Anicteric.  No injection's.  No discharge.  Normal-appearing conjunctivae.  Ears:  TMs are clear bilaterally.  Nose:  No __________ along the nares.  Mouth:  Moist mucous membranes.  Uvula midline.  Oropharynx is without  erythema or exudate.  With upper dentures noted.  NECK:  Supple with free range of motion and nontender.  No meningeal signs  appreciated.  There is no lymphadenopathy, thyromegaly, bruits, or JVD.  LUNGS:  Clear to percussion and auscultation bilaterally without rales,  rhonchi or wheezes.  HEART:  Regular rate and rhythm.  S1 AND S2 without rub, murmur, or gallop.  ABDOMEN:  Well-healed surgical scar appreciated.  Nondistended.  Bowel  sounds present.  Nontender.  No guarding or rebound.  No hepatosplenomegaly.  No pulsatile masses appreciated.  EXTREMITIES:  No clubbing, cyanosis or edema.  RECTAL:  Normal sphincter tone with brown stool in the vault.  Heme  negative.   LABORATORY DATA:  The pH 7.433, pCO2 30.8, bicarb 21.  CBC:  White blood  cells 3.7, H&H 14.1 over 41.9 with a platelet count of 242.  PT 13.8, INR  1.1, PTT 39.  Sodium 140, potassium 3.8, chloride 107, glucose 71, BUN 13,  creatinine 0.7.  CK-MB 1.6.  Troponin less than 0.05.  Myoglobin of 51.8.   Fecal occult blood is negative.   Chest x-ray performed on October 07, 2003 with the impression of COPD,  cardiomegaly, and no active disease.   EKG performed on October 07, 2003 showing sinus rhythm at a rate of approximately 55, although the EKG report says 117, which is clearly wrong.  P-R interval of 133, QRS 67, QTC 297  with an R axis of -13.  There are T  wave inversions only in lead V1.  There is no ST segment changes.  There are  no ischemic changes.  There are no Q waves for comparison;  however, there  are EKGs consistent with a low voltage throughout the limb leads and  precordium.  There is, what appears to be, a large P wave in lead II,  perhaps consistent with P mitrale; however, when compared with an EKG from  July, 2001, there have been no EKG changes.   ASSESSMENT/PLAN:  An 77 year old African-American female with Type II  diabetes, questionable peripheral vascular disease with unstable angina.  Assessment #1:  Neurologic/psychiatric:  No signs and symptoms of  meningitis, encephalitis, or cerebrovascular accident.  Currently, the  patient appears euthymic.  No active issues.  Assessment #2:  Pulmonary:  No active issues.  Assessment #3:  Cardiovascular:  Based upon the patient's history of  persistent chest pain at rest, this is worrisome for unstable angina.  We  will admit to the intermittent cardiac care unit, start her on 1 mcg/kg of  enoxaparin, and we will try to see if the patient will tolerate low-dose  aspirin.  We will place the patient on oxygen, telemetry.  Check an EKG in  the morning.  Cycle her enzymes.  Arrange for an Adenosine Cardiolite in the  morning.  We will check ABI's for her claudication symptoms.  Assessment #4:  Renal:  Will check a urine protein and creatinine, as the  patient should be on an ACE inhibitor, given her diabetes.  Assessment #5:  Gastrointestinal:  Will start the patient on an H2 receptor  antagonist to help with the GI upset from the aspirin.  Assessment #6:  Fluids, electrolytes, nutrition:  We will have the patient  on D5-1/2 normal saline with 20 mEq of KCL since she will be n.p.o.  We will  keep her K and mag above 4 and 2 respectively, and she will start her on the  A2-1 diet once she is able to be full p.o.  Assessment #7:  Infectious disease:   No active disease.  Assessment #8:  Hematology/oncology:  We will check platelet counts while  the patient is on low molecular weight heparin.  Assessment #9:  Endocrine:  We will check a TSH and an A1C.  Accu-Chek the  patient q.a.c. and q.h.s.  If necessary, start her on a regular insulin  sliding scale.  Continue her on her oral sulfonylurea.  Assessment #10:  Prophylaxis:  She will be on low molecular weight heparin,  which should cover her for DVT prophylaxis.  She will be on an H2 receptor  antagonist for GI prophylaxis, and then she will be on a full p.o. diet  afterwards.   DISPOSITION:  Patient has a living will in place which states that she would  not want prolonged mechanical ventilation; however, for now, she will be a  full code.                                                Ara D. Tammi Klippel, M.D.    ADM/MEDQ  D:  10/07/2003  T:  10/07/2003  Job:  [id number]   cc:   Reuben Likes, M.D.  [address]  [place]  [place] [address]  Fax: [phone]

## 2011-04-03 NOTE — Discharge Summary (Signed)
NAME:  [name] Colon, [name] Colon                       ACCOUNT NO.:  [id number]   MEDICAL RECORD NO.:  [id number]                   PATIENT TYPE:  INP   LOCATION:  2906                                 FACILITY:  [facility]   PHYSICIAN:  Hettie Holstein, D.O.                 DATE OF BIRTH:  02/15/1920   DATE OF ADMISSION:  10/07/2003  DATE OF DISCHARGE:                                 DISCHARGE SUMMARY   PRIMARY CARE PHYSICIAN:  Reuben Likes, M.D.   ADMISSION DIAGNOSIS:  Chest pain.   DISCHARGE DIAGNOSIS:  Noncardiac chest pain, no evidence of myocardial  event, and negative adenosine stress test.   ADDITIONAL DISCHARGE DIAGNOSES:  1. Claudication.  Lower extremity arterial evaluation revealed bilateral     moderate reduction of ankle brachial indices; ankle brachial index of     0.61 on her right extremity with 0.61 in regards to her left.  2. Type 2 diabetes.  3. Rheumatoid arthritis.  4. Hypothyroidism with subnormal TSH this admission.  5. Hand tremor.  6. History of hiatal hernia.  7. Bilateral cataract repair.  8. T&A in 1928.  9. Appendectomy in 1940.  10.      Colonoscopy in September 2003.  11.      EGD in 2003.   HOSPITAL COURSE:  This is a pleasant 77 year old African-American female who  presented to [facility] ER with a two-day history of intermittent chest pain  beginning the a.m. prior to admission.  It was not exacerbated by activity.  It occurred 10-15 times over the course of the day on October 06, 2003,  approximately 20 minutes for each episode.  In the emergency department she  was evaluated with EKG and cardiac enzymes.  There was no evidence on serial  cardiac enzymes of acute ischemic event.  Her EKG was not remarkable for  ischemia.  She underwent adenosine stress test which was negative, as well  as she underwent lower extremity arterial Doppler evaluation which revealed  claudication.  Arranged ABIs with ankle brachial index of 0.61 of her right  lower  extremity and 0.60 of her left.  She did experience some epigastric  burning consistent with GI symptomatology.  At this time it is recommended  that she undergo possible follow-up with her gastroenterologist if this  continues to persist.  With regard to her lower extremity edema  claudication, she was started on Trental here and recommended that she  follow up with her primary care doctor with regard to improvements or lack  thereof.  She has a subnormal TSH, and that was repeated with a normal T4  level.  She continues on 88 mcg of levothyroxine, and this was not changed.  It was just recommended repeating this as an outpatient and adjusting  accordingly.   LABORATORY DATA THIS ADMISSION:  T4 was 8.5, TSH was 0.147.  Urine  creatinine 0.6, protein was less than 4.  Hemoglobin A1C of 6.2.  Lipid  profile revealed a total cholesterol 163, triglycerides of 104, LDL of 101,  HDL of 41.  Hemoglobin was 14, hematocrit 41, platelets of 208, WBC of 3.9.   Chest x-ray revealed COPD, cardiomegaly, no active disease.                                                Hettie Holstein, D.O.    ESS/MEDQ  D:  10/09/2003  T:  10/10/2003  Job:  [id number]   cc:   Reuben Likes, M.D.  [address]  [place]  [place] [address]  Fax: [phone]

## 2011-04-03 NOTE — H&P (Signed)
[facility]  Patient:    [name] Colon, [name] Colon                      MRN: [id number] Adm. Date:  [id number] Attending:  Roque Lias                         History and Physical  CHIEF COMPLAINT: "Abdominal pain".  HISTORY OF PRESENT ILLNESS: The patient is a 77 year old female who has had a four day history of severe (9/10) stabbing right lower quadrant pain radiating to the right anterior thigh.  This has been associated with chills, nausea, frequent urination, but no fever, vomiting, dysuria, hematuria, diarrhea, blood in the stool, or GYN symptoms.  The patient states the pain is worse when lying down and somewhat better when she sits up or stands up.  The pain got better after the onset and the worse again.  The patient does has a history of kidney stones, diverticulitis, and has had appendectomy.  A urinalysis in our office was within normal limits.  PAST SURGICAL HISTORY: Appendectomy in 1940.  PAST MEDICAL HISTORY:  1. The patient was hospitalized in 1983 for a "pinched nerve".  2. She was hospitalized in 1992 with chest pain and had a negative stress     thallium scan.  3. She was hospitalized in July 2000 with diverticulitis involving the     hepatic flexure of the colon, which resolved with antibiotics.  4. The patient has been hypothyroid since 1990.  5. Essential tremor.  6. History of abnormal glucose tolerance, controlled with diet.  MEDICATIONS:  1. Synthroid 88 mcg q.d.  2. Mysoline 50 mg b.i.d.  ALLERGIES:  1. PENICILLIN causes generalized swelling; however, the patient is able to     take cephalosporins.  2. ASPIRIN causes upset stomach.  3. TETRACYCLINE causes nausea.  FAMILY HISTORY: The patients father died at age 62 with emphysema.  Mother died at age 34 with CHF.  A brother died at age 28 of stomach cancer.  She as three sisters, one with lymphoma, one with arthritis, and one glaucoma.  A sister died of breast  cancer.  She has two children in good health.  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  [place].  She has never used tobacco or alcohol.  REVIEW OF SYSTEMS: She denies other systemic, skin, eye, ENT, respiratory, cardiovascular, GI, GU, musculoskeletal, or neurologic complaints.  PHYSICAL EXAMINATION:  VITAL SIGNS: Blood pressure 132/80, pulse 80 and regular, respirations 20, temperature 97.0 degrees.  GENERAL: The patient is alert, but moaning and rocking with discomfort.  SKIN: Warm and dry.  No rash.  HEENT: Pupils are pinpoint after having received a dose of morphine and did not react well to light.  Fundi could not be seen.  Extraocular movements were full.  Sclerae nonicteric.  TMs normal.  No intraoral lesions.  Mucous membranes moist.  Pharynx clear.  NECK: Supple.  No adenopathy, JVD, or bruit.  Thyroid normal.  LUNGS: Clear to auscultation and percussion.  HEART: Regular rhythm.  No gallops, murmurs, clicks, or rubs.  BACK: Mild right CVA tenderness.  ABDOMEN: Soft.  There is moderate generalized tenderness with guarding over the entire abdomen, with no localizing tenderness to palpation or rebound tenderness.  There is no hepatosplenomegaly or mass.  Bowel sounds are hyperactive.  RECTAL: No  masses or tenderness.  Hemoccult negative stool.  PELVIC: Examination done in December 2000 and was not repeated at this time.  EXTREMITIES: No edema.  No calf tenderness.  Homan sign negative.  Pulses were full.  NEUROLOGIC: Alert and oriented x 3.  Speech was clear and appropriate.  No extremity weakness.  Does have a fine tremor of the outstretched hands.  DTRs 2+ and symmetrical.  Babinski downgoing.  Cranial nerves intact.  ADMISSION IMPRESSION:  1. Right lower quadrant pain, possibly due to diverticulitis, kidney stone,     urinary tract infection, ovarian cyst or tumor,  bowel obstruction,     gastroenteritis, ischemic bowel, or aneurysm.  2. History of hypothyroidism.  3. History of essential tremor.  PLAN:  1. IV fluids.  2. NPO.  3. IV pain medications.  4. IV Cefotan.  5. Abdominal CT scan.  6. Surgical consultation. DD:  06/02/00 TD:  06/03/00 Job: [id number]

## 2011-04-07 ENCOUNTER — Other Ambulatory Visit: Payer: Medicare Other

## 2011-04-07 ENCOUNTER — Ambulatory Visit
Admission: RE | Admit: 2011-04-07 | Discharge: 2011-04-07 | Disposition: A | Discharge status: Home / Self Care | Payer: Medicare Other | Source: Ambulatory Visit | Attending: Nurse Practitioner | Admitting: Nurse Practitioner

## 2011-04-07 DIAGNOSIS — R911 Solitary pulmonary nodule: Secondary | ICD-10-CM

## 2011-04-07 MED ORDER — IOHEXOL 300 MG/ML  SOLN
75.0000 mL | Freq: Once | INTRAMUSCULAR | Status: AC | PRN
  Administered 2011-04-07: 75 mL via INTRAVENOUS

## 2011-09-18 ENCOUNTER — Other Ambulatory Visit: Payer: Self-pay | Admitting: Family Medicine

## 2011-09-18 DIAGNOSIS — Z1231 Encounter for screening mammogram for malignant neoplasm of breast: Secondary | ICD-10-CM

## 2011-10-19 ENCOUNTER — Ambulatory Visit
Admission: RE | Admit: 2011-10-19 | Discharge: 2011-10-19 | Disposition: A | Discharge status: Home / Self Care | Payer: Medicare Other | Source: Ambulatory Visit | Attending: Family Medicine | Admitting: Family Medicine

## 2011-10-19 DIAGNOSIS — Z1231 Encounter for screening mammogram for malignant neoplasm of breast: Secondary | ICD-10-CM

## 2011-12-27 ENCOUNTER — Encounter (HOSPITAL_COMMUNITY): Payer: Self-pay | Admitting: Adult Health

## 2011-12-27 ENCOUNTER — Other Ambulatory Visit: Payer: Self-pay

## 2011-12-27 ENCOUNTER — Emergency Department (HOSPITAL_COMMUNITY)
Admission: EM | Admit: 2011-12-27 | Discharge: 2011-12-27 | Disposition: A | Discharge status: Home / Self Care | Payer: Medicare Other | Attending: Emergency Medicine | Admitting: Emergency Medicine

## 2011-12-27 DIAGNOSIS — Z79899 Other long term (current) drug therapy: Secondary | ICD-10-CM | POA: Insufficient documentation

## 2011-12-27 DIAGNOSIS — I10 Essential (primary) hypertension: Secondary | ICD-10-CM | POA: Insufficient documentation

## 2011-12-27 DIAGNOSIS — E079 Disorder of thyroid, unspecified: Secondary | ICD-10-CM | POA: Insufficient documentation

## 2011-12-27 DIAGNOSIS — R1012 Left upper quadrant pain: Secondary | ICD-10-CM | POA: Insufficient documentation

## 2011-12-27 DIAGNOSIS — R0989 Other specified symptoms and signs involving the circulatory and respiratory systems: Secondary | ICD-10-CM | POA: Insufficient documentation

## 2011-12-27 DIAGNOSIS — R0609 Other forms of dyspnea: Secondary | ICD-10-CM | POA: Insufficient documentation

## 2011-12-27 DIAGNOSIS — E119 Type 2 diabetes mellitus without complications: Secondary | ICD-10-CM | POA: Insufficient documentation

## 2011-12-27 HISTORY — DX: Essential (primary) hypertension: I10

## 2011-12-27 HISTORY — DX: Disorder of thyroid, unspecified: E07.9

## 2011-12-27 LAB — DIFFERENTIAL
Basophils Absolute: 0 10*3/uL (ref 0.0–0.1)
Basophils Relative: 0 % (ref 0–1)
Eosinophils Absolute: 0.1 10*3/uL (ref 0.0–0.7)
Eosinophils Relative: 1 % (ref 0–5)
Neutrophils Relative %: 59 % (ref 43–77)

## 2011-12-27 LAB — COMPREHENSIVE METABOLIC PANEL
ALT: 16 U/L (ref 0–35)
AST: 25 U/L (ref 0–37)
Albumin: 3.9 g/dL (ref 3.5–5.2)
Alkaline Phosphatase: 92 U/L (ref 39–117)
Calcium: 9.8 mg/dL (ref 8.4–10.5)
GFR calc Af Amer: >90 mL/min (ref >90)
Potassium: 4.6 mEq/L (ref 3.5–5.1)
Sodium: 137 mEq/L (ref 135–145)
Total Protein: 8.2 g/dL (ref 6.0–8.3)

## 2011-12-27 LAB — URINALYSIS, ROUTINE W REFLEX MICROSCOPIC
Bilirubin Urine: NEGATIVE
Glucose, UA: NEGATIVE mg/dL
Hgb urine dipstick: NEGATIVE
Specific Gravity, Urine: 1.015 (ref 1.005–1.030)
pH: 7.5 (ref 5.0–8.0)

## 2011-12-27 LAB — POCT I-STAT TROPONIN I

## 2011-12-27 LAB — CBC
MCH: 32.4 pg (ref 26.0–34.0)
MCV: 92.2 fL (ref 78.0–100.0)
Platelets: 201 10*3/uL (ref 150–400)
RBC: 4.47 MIL/uL (ref 3.87–5.11)
RDW: 13.4 % (ref 11.5–15.5)

## 2011-12-27 NOTE — ED Provider Notes (Signed)
History     CSN: [id number]  Arrival date & time 12/27/11  1146   First MD Initiated Contact with Patient 12/27/11 1216      Chief Complaint  Patient presents with  . Abdominal Pain    (Consider location/radiation/quality/duration/timing/severity/associated sxs/prior treatment) HPI History provided by pt.   Pt developed non-radiating, LUQ pain this am while she was dressing for church.  Progressively worsened over the course of a couple hours but then resolved spontaneously by the time she arrived in ED.  Currently asymptomatic.  No associated fever, CP, SOB, N/V/D, hematemesis/hematochezia/melena, GU sx.  Has occasional "labored breathing" when is over-exerting herself but this is chronic and stable.  Has never had pain like this before.   Past abd surgeries include appendectomy.  Past Medical History  Diagnosis Date  . Hypertension   . Diabetes mellitus   . Thyroid disease     History reviewed. No pertinent past surgical history.  History reviewed. No pertinent family history.  History  Substance Use Topics  . Smoking status: Never Smoker   . Smokeless tobacco: Not on file  . Alcohol Use: No    OB History    Grav Para Term Preterm Abortions TAB SAB Ect Mult Living                  Review of Systems  All other systems reviewed and are negative.    Allergies  Doxycycline; Aspirin; and Penicillins  Home Medications   Current Outpatient Rx  Name Route Sig Dispense Refill  . ALENDRONATE SODIUM 70 MG PO TABS Oral Take 70 mg by mouth every 7 (seven) days. Take with a full glass of water on an empty stomach.    Marland Kitchen CALCIUM CARBONATE-VITAMIN D 500-200 MG-UNIT PO TABS Oral Take 1 tablet by mouth 2 (two) times daily.    Marland Kitchen VITAMIN D 1000 UNITS PO TABS Oral Take 1,000 Units by mouth daily.    . DESONIDE 0.05 % EX CREA Topical Apply 1 application topically 2 (two) times daily.    Marland Kitchen GLIPIZIDE 5 MG PO TABS Oral Take 5 mg by mouth 2 (two) times daily before a meal.    .  HYDROCHLOROTHIAZIDE 25 MG PO TABS Oral Take 25 mg by mouth daily.    Marland Kitchen LEVOTHYROXINE SODIUM 88 MCG PO TABS Oral Take 88 mcg by mouth daily.    Marland Kitchen POTASSIUM CHLORIDE CRYS ER 20 MEQ PO TBCR Oral Take 20 mEq by mouth 2 (two) times daily.    Marland Kitchen PRIMIDONE 250 MG PO TABS Oral Take 250 mg by mouth 2 (two) times daily.    . TORSEMIDE 20 MG PO TABS Oral Take 20 mg by mouth daily.      BP 166/74  Pulse 81  Temp 98.7 F (37.1 C)  Resp 16  SpO2 98%  Physical Exam  Nursing note and vitals reviewed. Constitutional: She is oriented to person, place, and time. She appears well-developed and well-nourished. No distress.       Well-appearing  HENT:  Head: Normocephalic and atraumatic.  Eyes:       Normal appearance  Neck: Normal range of motion.  Cardiovascular: Normal rate and regular rhythm.   Pulmonary/Chest: Effort normal and breath sounds normal.  Abdominal: Soft. Bowel sounds are normal. She exhibits no distension and no mass. There is no tenderness. There is no rebound and no guarding.       No CVA tenderness  Neurological: She is alert and oriented to person, place, and time.  Skin: Skin is warm and dry. No rash noted.  Psychiatric: She has a normal mood and affect. Her behavior is normal.    ED Course  Procedures (including critical care time)   Date: 12/27/2011  Rate: 80  Rhythm: normal sinus rhythm and premature atrial contractions (PAC)  QRS Axis: right  Intervals: normal  ST/T Wave abnormalities: normal  Conduction Disutrbances:none  Narrative Interpretation:   Old EKG Reviewed: none available   Labs Reviewed  COMPREHENSIVE METABOLIC PANEL - Abnormal; Notable for the following:    Glucose, Bld 111 (*)    GFR calc non Af Amer 81 (*)    All other components within normal limits  CBC  DIFFERENTIAL  LIPASE, BLOOD  URINALYSIS, ROUTINE W REFLEX MICROSCOPIC  POCT I-STAT TROPONIN I  LAB REPORT - SCANNED   No results found.   1. Abdominal pain       MDM  78yo F  presents w/ LUQ pain w/out associated sx x several hours this am.  Currently asymptomatic.  On exam, afebrile, well-appearing, abd benign and non-tender.  Labs pending.    Labs unremarkable.  Pt has continued to be asymptomatic in the ED.  D/c'd home but advised her to come back to ED if pain returns or she develops CP/SOB.         Otilio Miu, PA 12/27/11 1405  Otilio Miu, PA 12/28/11 1104

## 2011-12-27 NOTE — ED Notes (Signed)
Presents with abdominal pain that began this am located in left upper quad assocaited with SOB described as cramping. Denies nausea, denies diarrhea, last BM this am normal for pt. Pain began before eating and did not stop after eating breakfast this am.

## 2012-01-04 NOTE — ED Provider Notes (Signed)
Patient is an 78 y.o. Female who had an episode of luq pain which lasted about 15 minutes. The symptoms have resolved and no associated symptoms have been noted.  PE is normal with soft, nontender abdomen and heart with rrr.  I agree with Ms Schinlever's assessment and plan.   Hilario Quarry, MD 01/04/12 [phone]

## 2012-07-07 ENCOUNTER — Other Ambulatory Visit: Payer: Self-pay | Admitting: Gastroenterology

## 2012-07-07 DIAGNOSIS — R1310 Dysphagia, unspecified: Secondary | ICD-10-CM

## 2012-07-14 ENCOUNTER — Ambulatory Visit
Admission: RE | Admit: 2012-07-14 | Discharge: 2012-07-14 | Disposition: A | Discharge status: Home / Self Care | Payer: Medicare Other | Source: Ambulatory Visit | Attending: Gastroenterology | Admitting: Gastroenterology

## 2012-07-14 DIAGNOSIS — R1310 Dysphagia, unspecified: Secondary | ICD-10-CM

## 2013-06-09 ENCOUNTER — Emergency Department (HOSPITAL_COMMUNITY): Payer: Medicare Other

## 2013-06-09 ENCOUNTER — Encounter (HOSPITAL_COMMUNITY): Payer: Self-pay | Admitting: Emergency Medicine

## 2013-06-09 ENCOUNTER — Other Ambulatory Visit: Payer: Self-pay

## 2013-06-09 ENCOUNTER — Emergency Department (HOSPITAL_COMMUNITY)
Admission: EM | Admit: 2013-06-09 | Discharge: 2013-06-10 | Disposition: A | Discharge status: Home / Self Care | Payer: Medicare Other | Attending: Emergency Medicine | Admitting: Emergency Medicine

## 2013-06-09 DIAGNOSIS — I10 Essential (primary) hypertension: Secondary | ICD-10-CM | POA: Insufficient documentation

## 2013-06-09 DIAGNOSIS — R091 Pleurisy: Secondary | ICD-10-CM | POA: Insufficient documentation

## 2013-06-09 DIAGNOSIS — M7989 Other specified soft tissue disorders: Secondary | ICD-10-CM | POA: Insufficient documentation

## 2013-06-09 DIAGNOSIS — E119 Type 2 diabetes mellitus without complications: Secondary | ICD-10-CM | POA: Insufficient documentation

## 2013-06-09 DIAGNOSIS — Z79899 Other long term (current) drug therapy: Secondary | ICD-10-CM | POA: Insufficient documentation

## 2013-06-09 DIAGNOSIS — E079 Disorder of thyroid, unspecified: Secondary | ICD-10-CM | POA: Insufficient documentation

## 2013-06-09 DIAGNOSIS — R079 Chest pain, unspecified: Secondary | ICD-10-CM | POA: Insufficient documentation

## 2013-06-09 DIAGNOSIS — Z880 Allergy status to penicillin: Secondary | ICD-10-CM | POA: Insufficient documentation

## 2013-06-09 LAB — COMPREHENSIVE METABOLIC PANEL
ALT: 18 U/L (ref 0–35)
AST: 21 U/L (ref 0–37)
Alkaline Phosphatase: 69 U/L (ref 39–117)
CO2: 26 mEq/L (ref 19–32)
GFR calc Af Amer: 88 mL/min — ABNORMAL LOW (ref >90)
Glucose, Bld: 80 mg/dL (ref 70–99)
Potassium: 4 mEq/L (ref 3.5–5.1)
Sodium: 137 mEq/L (ref 135–145)
Total Protein: 7.6 g/dL (ref 6.0–8.3)

## 2013-06-09 LAB — CBC WITH DIFFERENTIAL/PLATELET
Basophils Absolute: 0 10*3/uL (ref 0.0–0.1)
Eosinophils Absolute: 0.1 10*3/uL (ref 0.0–0.7)
Lymphocytes Relative: 54 % — ABNORMAL HIGH (ref 12–46)
Lymphs Abs: 3.1 10*3/uL (ref 0.7–4.0)
Neutrophils Relative %: 33 % — ABNORMAL LOW (ref 43–77)
Platelets: 219 10*3/uL (ref 150–400)
RBC: 4.55 MIL/uL (ref 3.87–5.11)
RDW: 12.7 % (ref 11.5–15.5)
WBC: 5.8 10*3/uL (ref 4.0–10.5)

## 2013-06-09 LAB — TROPONIN I: Troponin I: <0.3 ng/mL (ref <0.30)

## 2013-06-09 MED ORDER — FENTANYL CITRATE 0.05 MG/ML IJ SOLN
25.0000 ug | Freq: Once | INTRAMUSCULAR | Status: DC
  Filled 2013-06-09: qty 2

## 2013-06-09 MED ORDER — IOHEXOL 350 MG/ML SOLN
100.0000 mL | Freq: Once | INTRAVENOUS | Status: AC | PRN
  Administered 2013-06-09: 100 mL via INTRAVENOUS

## 2013-06-09 NOTE — ED Provider Notes (Signed)
CSN: [id number]     Arrival date & time 06/09/13  1619 History     First MD Initiated Contact with Patient 06/09/13 1630     Chief Complaint  Patient presents with  . Leg Swelling  . Flank Pain   (Consider location/radiation/quality/duration/timing/severity/associated sxs/prior Treatment) HPI Comments: Patient is a 79 year old female past medical history significant for hypertension, diabetes mellitus, thyroid disease presented to the emergency department from primary care doctor's office for 2 weeks for non-radiating right-sided sharp intermittent chest pain. Patient states the pain 10/10 with no alleviating or aggravating factors. Patient denies any fevers, chills, nausea, vomiting, cough, recent surgeries or immobilizations, changes in chronic leg swelling, exogenous estrogen use.     Past Medical History  Diagnosis Date  . Hypertension   . Diabetes mellitus   . Thyroid disease    History reviewed. No pertinent past surgical history. No family history on file. History  Substance Use Topics  . Smoking status: Never Smoker   . Smokeless tobacco: Not on file  . Alcohol Use: No   OB History   Grav Para Term Preterm Abortions TAB SAB Ect Mult Living                 Review of Systems  Constitutional: Negative for fever and chills.  HENT: Negative.   Eyes: Negative.   Respiratory: Negative for cough and shortness of breath.   Cardiovascular: Positive for chest pain.  Gastrointestinal: Negative.   Genitourinary: Negative.   Musculoskeletal: Negative.   Skin: Negative for rash.  Neurological: Negative.     Allergies  Doxycycline; Aspirin; and Penicillins  Home Medications   Current Outpatient Rx  Name  Route  Sig  Dispense  Refill  . alendronate (FOSAMAX) 70 MG tablet   Oral   Take 70 mg by mouth every 7 (seven) days. Take with a full glass of water on an empty stomach.         . calcium-vitamin D (OSCAL WITH D) 500-200 MG-UNIT per tablet   Oral   Take 1  tablet by mouth 2 (two) times daily.         . cholecalciferol (VITAMIN D) 1000 UNITS tablet   Oral   Take 1,000 Units by mouth daily.         Marland Kitchen levothyroxine (SYNTHROID, LEVOTHROID) 75 MCG tablet   Oral   Take 75 mcg by mouth daily before breakfast.         . metFORMIN (GLUCOPHAGE) 500 MG tablet   Oral   Take 500 mg by mouth daily with breakfast.         . Multiple Vitamins-Minerals (MULTIVITAMIN WITH MINERALS) tablet   Oral   Take 1 tablet by mouth daily.         . multivitamin-lutein (OCUVITE-LUTEIN) CAPS   Oral   Take 1 capsule by mouth daily.         . potassium chloride SA (K-DUR,KLOR-CON) 20 MEQ tablet   Oral   Take 20 mEq by mouth 2 (two) times daily.         . primidone (MYSOLINE) 250 MG tablet   Oral   Take 250 mg by mouth 2 (two) times daily.         Marland Kitchen torsemide (DEMADEX) 20 MG tablet   Oral   Take 20 mg by mouth daily.          BP 189/56  Pulse 67  Temp(Src) 97.9 F (36.6 C) (Oral)  Resp 16  SpO2 96% Physical  Exam  Constitutional: She is oriented to person, place, and time. She appears well-developed and well-nourished.  HENT:  Head: Normocephalic and atraumatic.  Eyes: Conjunctivae are normal.  Neck: Neck supple.  Cardiovascular: Normal rate, regular rhythm, normal heart sounds and intact distal pulses.   Pulmonary/Chest: Effort normal and breath sounds normal. She exhibits tenderness.    Reproducible chest pain.   Abdominal: Soft. Bowel sounds are normal.  Musculoskeletal:  1+ Bilateral pedal edema  Neurological: She is alert and oriented to person, place, and time.  Skin: Skin is warm and dry. No rash noted. No erythema.    ED Course   Procedures (including critical care time)   Date: 06/09/2013  Rate: 67  Rhythm: sinus rhythm  QRS Axis: normal  Intervals: normal  ST/T Wave abnormalities: early precordial R/S transition  Conduction Disutrbances:none  Narrative Interpretation:   Old EKG Reviewed:  unchanged    Labs Reviewed  D-DIMER, QUANTITATIVE - Abnormal; Notable for the following:    D-Dimer, Quant 2.20 (*)    All other components within normal limits  CBC WITH DIFFERENTIAL - Abnormal; Notable for the following:    Neutrophils Relative % 33 (*)    Lymphocytes Relative 54 (*)    All other components within normal limits  COMPREHENSIVE METABOLIC PANEL - Abnormal; Notable for the following:    GFR calc non Af Amer 76 (*)    GFR calc Af Amer 88 (*)    All other components within normal limits  TROPONIN I   Ct Angio Chest Pe W/cm &/or Wo Cm  06/09/2013   *RADIOLOGY REPORT*  Clinical Data: Right-sided chest pain.  Elevated D-dimer.  CT ANGIOGRAPHY CHEST  Technique:  Multidetector CT imaging of the chest using the standard protocol during bolus administration of intravenous contrast. Multiplanar reconstructed images including MIPs were obtained and reviewed to evaluate the vascular anatomy.  Contrast: OMNIPAQUE IOHEXOL 350 MG/ML SOLN  Comparison: 04/07/2011  Findings: Technically adequate study with good opacification of the central and segmental pulmonary arteries.  No focal filling defects demonstrated.  No evidence of significant pulmonary embolus.  Normal heart size.  Calcification of coronary arteries.  Normal caliber thoracic aorta with calcification.  No significant lymphadenopathy in the chest.  Esophagus is decompressed.  Small esophageal hiatal hernia is suggested.  No pleural effusions. Diffuse emphysematous changes and scarring in the lungs.  Bronchial wall thickening suggesting chronic bronchitis.  Mild central bronchiectasis.  Airways appear patent.  No pneumothorax.  No focal airspace consolidation in the lungs.  Diffuse degenerative changes in the thoracic spine.  No destructive bone lesions are appreciated.  IMPRESSION: No evidence of significant pulmonary embolus.  Diffuse emphysema and fibrosis in the lungs.  Chronic bronchitic changes and mild bronchiectasis.  No  evidence of active pulmonary disease.   Original Report Authenticated By: Burman Nieves, M.D.   1. Pleurisy     MDM  Patient presented from PCP for PE r/o. On examination patient with reproducible R sided chest pain, exam otherwise unremarkable. No unilateral leg swelling noted on examination. Homan's sign negative. Labs, EKG, and Vitals reviewed. CT angio was obtained for elevated D-dimer, no PE noted on imaging. Chest pain most likely pleuritic in nature. Pulmonary and cardiac etiologies unlikely with negative work ups. Advised to f/u with PCP. Patient is agreeable to plan. Patient d/w with Dr. Fredderick Phenix, agrees with plan. Patient is stable at time of discharge.           Lise Auer Chenise Mulvihill, PA-C 06/10/13 0010

## 2013-06-09 NOTE — ED Provider Notes (Signed)
  Physical Exam  BP 189/56  Pulse 67  Temp(Src) 97.9 F (36.6 C) (Oral)  Resp 16  SpO2 96%  Physical Exam  ED Course  Angiocath insertion Date/Time: 06/09/2013 11:30 PM Performed by: Zula Hovsepian Authorized by: Rolan Bucco Consent: Verbal consent obtained. Risks and benefits: risks, benefits and alternatives were discussed Consent given by: patient Local anesthesia used: no Patient tolerance: Patient tolerated the procedure well with no immediate complications. Comments: Ultrasound was used to place a 20g catheter in the right [facility]    MDM Patient persists with intermittent right-sided chest pain over the last 2 weeks. It is reproducible on palpation. Chest no shortness of breath or other associated symptoms. Her primary care physician sent her over here to rule out PE. Her d-dimer was elevated so we will go ahead and proceed with chest CT. Her symptoms do not sound consistent for acute coronary syndrome        Rolan Bucco, MD 06/09/13 [phone]

## 2013-06-09 NOTE — ED Notes (Signed)
[place] aware and possibly VQ scan,  Pt and son aware will determine and let them know,

## 2013-06-09 NOTE — ED Notes (Addendum)
Attempted IV, got successful blood return then pt jumped and IV blew. Pt stated that I only had one time to stick her and she preferred po medication and did not want the IV. PA made aware.

## 2013-06-09 NOTE — ED Notes (Signed)
Attempted IV 20 gauge in right AC  Unsuccessful,

## 2013-06-09 NOTE — ED Notes (Signed)
Pt c/o right sided rib cage pain x 2 weeks and left leg swelling. 5/10 pain

## 2013-06-09 NOTE — Progress Notes (Signed)
EDCM spoke to patient and her son at bedside about possible home heslth needs.  As per patient, she lives with her son and does not have any medical equipment at home.  Patient stated, "I went and bought myself a cane for when I need it down the road."  Patient reports no difficulties ambulating at home and is able to feed, dress and bathe herself without difficulty.  No home health needs required at this time.

## 2013-06-10 NOTE — ED Provider Notes (Signed)
Medical screening examination/treatment/procedure(s) were conducted as a shared visit with non-physician practitioner(s) and myself.  I personally evaluated the patient during the encounter   Rolan Bucco, MD 06/10/13 [phone]

## 2013-08-03 ENCOUNTER — Telehealth (HOSPITAL_COMMUNITY): Payer: Self-pay | Admitting: *Deleted

## 2013-08-07 ENCOUNTER — Other Ambulatory Visit (HOSPITAL_COMMUNITY): Payer: Self-pay | Admitting: *Deleted

## 2013-08-07 DIAGNOSIS — R0989 Other specified symptoms and signs involving the circulatory and respiratory systems: Secondary | ICD-10-CM

## 2013-08-16 ENCOUNTER — Ambulatory Visit (HOSPITAL_COMMUNITY)
Admission: RE | Admit: 2013-08-16 | Discharge: 2013-08-16 | Disposition: A | Discharge status: Home / Self Care | Payer: Medicare Other | Source: Ambulatory Visit | Attending: Cardiology | Admitting: Cardiology

## 2013-08-16 ENCOUNTER — Telehealth: Payer: Self-pay | Admitting: *Deleted

## 2013-08-16 DIAGNOSIS — R0989 Other specified symptoms and signs involving the circulatory and respiratory systems: Secondary | ICD-10-CM

## 2013-08-16 DIAGNOSIS — I70219 Atherosclerosis of native arteries of extremities with intermittent claudication, unspecified extremity: Secondary | ICD-10-CM

## 2013-08-16 NOTE — Telephone Encounter (Signed)
 [facility] states pt is + for B/L femoral occlusion and is scheduled for evaluation by Dr Allyson Sabal on 08/21/2013. FYI

## 2013-08-16 NOTE — Progress Notes (Signed)
Arterial Duplex Lower Extremity Completed. Marilynne Halsted, BS, RDMS, RVT

## 2013-08-21 ENCOUNTER — Encounter: Payer: Self-pay | Admitting: Cardiovascular Disease

## 2013-08-21 ENCOUNTER — Ambulatory Visit (INDEPENDENT_AMBULATORY_CARE_PROVIDER_SITE_OTHER): Payer: Medicare Other | Admitting: Cardiovascular Disease

## 2013-08-21 VITALS — BP 128/60 | HR 80 | Ht 60.0 in | Wt 137.0 lb

## 2013-08-21 DIAGNOSIS — E119 Type 2 diabetes mellitus without complications: Secondary | ICD-10-CM

## 2013-08-21 DIAGNOSIS — I739 Peripheral vascular disease, unspecified: Secondary | ICD-10-CM

## 2013-08-21 NOTE — Progress Notes (Signed)
08/21/2013 [name] Colon   [birth year]  [id number]  Primary Physician Willow Ora, MD Primary Cardiologist: Runell Gess MD Roseanne Reno   HPI:  [name] Colon is a delightful 79 year old thin appearing widowed African American female mother of 2, grandmother to 2 grandchildren who worked up in [place] [employer] in up in the past. She was referred by Dr. Cristie Hem for peripheral vascular evaluation because of nonpalpable pedal pulses and claudication. Her only risk factor includes type 2 diabetes. She denies chest pain or shortness of breath. She's never had a heart attack or stroke. She does complain of one block claudication bilaterally. Lower extremity arterial  Dopplers performed in our office1 0/1/14  revealed occluded ABIs bilaterally.   Current Outpatient Prescriptions  Medication Sig Dispense Refill  . alendronate (FOSAMAX) 70 MG tablet Take 70 mg by mouth every 7 (seven) days. Take with a full glass of water on an empty stomach.      . calcium-vitamin D (OSCAL WITH D) 500-200 MG-UNIT per tablet Take 1 tablet by mouth 2 (two) times daily.      . cholecalciferol (VITAMIN D) 1000 UNITS tablet Take 1,000 Units by mouth daily.      . fish oil-omega-3 fatty acids 1000 MG capsule Take 2 g by mouth daily.      Marland Kitchen levothyroxine (SYNTHROID, LEVOTHROID) 75 MCG tablet Take 75 mcg by mouth daily before breakfast.      . metFORMIN (GLUCOPHAGE) 500 MG tablet Take 500 mg by mouth daily with breakfast.      . Multiple Vitamins-Minerals (MULTIVITAMIN WITH MINERALS) tablet Take 1 tablet by mouth daily.      . multivitamin-lutein (OCUVITE-LUTEIN) CAPS Take 1 capsule by mouth daily.      . potassium chloride SA (K-DUR,KLOR-CON) 20 MEQ tablet Take 20 mEq by mouth 2 (two) times daily.      . primidone (MYSOLINE) 250 MG tablet Take 250 mg by mouth 2 (two) times daily.      Marland Kitchen torsemide (DEMADEX) 20 MG tablet Take 20 mg by mouth daily.       No current  facility-administered medications for this visit.    Allergies  Allergen Reactions  . Doxycycline Anaphylaxis  . Aspirin Nausea And Vomiting  . Penicillins Rash    History   Social History  . Marital Status: Widowed    Spouse Name: N/A    Number of Children: N/A  . Years of Education: N/A   Occupational History  . Not on file.   Social History Main Topics  . Smoking status: Never Smoker   . Smokeless tobacco: Not on file  . Alcohol Use: No  . Drug Use: No  . Sexual Activity:    Other Topics Concern  . Not on file   Social History Narrative  . No narrative on file     Review of Systems: General: negative for chills, fever, night sweats or weight changes.  Cardiovascular: negative for chest pain, dyspnea on exertion, edema, orthopnea, palpitations, paroxysmal nocturnal dyspnea or shortness of breath Dermatological: negative for rash Respiratory: negative for cough or wheezing Urologic: negative for hematuria Abdominal: negative for nausea, vomiting, diarrhea, bright red blood per rectum, melena, or hematemesis Neurologic: negative for visual changes, syncope, or dizziness All other systems reviewed and are otherwise negative except as noted above.    Blood pressure 128/60, pulse 80, height 5' (1.524 m), weight 137 lb (62.143 kg).  General appearance: alert and no distress Neck: no adenopathy,  no carotid bruit, no JVD, supple, symmetrical, trachea midline and thyroid not enlarged, symmetric, no tenderness/mass/nodules Lungs: clear to auscultation bilaterally Heart: regular rate and rhythm, S1, S2 normal, no murmur, click, rub or gallop Extremities: extremities normal, atraumatic, no cyanosis or edema and absent pedal pulses bilaterally  EKG not performed today  ASSESSMENT AND PLAN:   Claudication Patient was referred to me by Dr. Cristie Hem at [facility]. She was referred for evaluation of claudication. Her risk factors include non-insulin-requiring  diabetes. She had large extremity arterial Doppler studies performed 08/16/13 referring ABIs in the midpoint of full range occluded SFAs bilaterally. She complains of lifestyle limiting claudication however this does not keep her from performing her activities of daily living.we have discussed options including invasive approach versus pharmacologic therapy with Pletal. The patient does not wish either especially since Pletal is not a generic drug. I will see her back on an as-needed basis.      Runell Gess MD FACP,FACC,FAHA, [facility] 08/21/2013 4:08 PM

## 2013-08-21 NOTE — Patient Instructions (Signed)
Your physician recommends that you schedule a follow-up appointment AS NEEDED.  

## 2013-08-21 NOTE — Assessment & Plan Note (Addendum)
Patient was referred to me by Dr. Cristie Hem at [facility]. She was referred for evaluation of claudication. Her risk factors include non-insulin-requiring diabetes. She had large extremity arterial Doppler studies performed 08/16/13 referring ABIs in the midpoint of full range occluded SFAs bilaterally. She complains of lifestyle limiting claudication however this does not keep her from performing her activities of daily living.we have discussed options including invasive approach versus pharmacologic therapy with Pletal. The patient does not wish either especially since Pletal is not a generic drug. I will see her back on an as-needed basis.

## 2013-09-24 ENCOUNTER — Emergency Department (HOSPITAL_COMMUNITY): Payer: Medicare Other

## 2013-09-24 ENCOUNTER — Encounter (HOSPITAL_COMMUNITY): Payer: Self-pay | Admitting: Emergency Medicine

## 2013-09-24 ENCOUNTER — Inpatient Hospital Stay (HOSPITAL_COMMUNITY)
Admission: EM | Admit: 2013-09-24 | Discharge: 2013-10-02 | DRG: 388 | Disposition: A | Discharge status: Skilled Nursing Facility | Payer: Medicare Other | Attending: Internal Medicine | Admitting: Internal Medicine

## 2013-09-24 DIAGNOSIS — R509 Fever, unspecified: Secondary | ICD-10-CM

## 2013-09-24 DIAGNOSIS — I739 Peripheral vascular disease, unspecified: Secondary | ICD-10-CM

## 2013-09-24 DIAGNOSIS — I472 Ventricular tachycardia, unspecified: Secondary | ICD-10-CM | POA: Diagnosis present

## 2013-09-24 DIAGNOSIS — E039 Hypothyroidism, unspecified: Secondary | ICD-10-CM | POA: Diagnosis present

## 2013-09-24 DIAGNOSIS — I878 Other specified disorders of veins: Secondary | ICD-10-CM | POA: Diagnosis present

## 2013-09-24 DIAGNOSIS — I4729 Other ventricular tachycardia: Secondary | ICD-10-CM | POA: Diagnosis present

## 2013-09-24 DIAGNOSIS — E119 Type 2 diabetes mellitus without complications: Secondary | ICD-10-CM | POA: Diagnosis present

## 2013-09-24 DIAGNOSIS — L299 Pruritus, unspecified: Secondary | ICD-10-CM | POA: Diagnosis not present

## 2013-09-24 DIAGNOSIS — D696 Thrombocytopenia, unspecified: Secondary | ICD-10-CM | POA: Diagnosis present

## 2013-09-24 DIAGNOSIS — D72819 Decreased white blood cell count, unspecified: Secondary | ICD-10-CM | POA: Diagnosis present

## 2013-09-24 DIAGNOSIS — R000 Tachycardia, unspecified: Secondary | ICD-10-CM | POA: Diagnosis present

## 2013-09-24 DIAGNOSIS — E871 Hypo-osmolality and hyponatremia: Secondary | ICD-10-CM | POA: Diagnosis present

## 2013-09-24 DIAGNOSIS — J9600 Acute respiratory failure, unspecified whether with hypoxia or hypercapnia: Secondary | ICD-10-CM | POA: Diagnosis not present

## 2013-09-24 DIAGNOSIS — E875 Hyperkalemia: Secondary | ICD-10-CM | POA: Diagnosis not present

## 2013-09-24 DIAGNOSIS — K56609 Unspecified intestinal obstruction, unspecified as to partial versus complete obstruction: Principal | ICD-10-CM | POA: Clinically undetermined

## 2013-09-24 DIAGNOSIS — R232 Flushing: Secondary | ICD-10-CM | POA: Diagnosis not present

## 2013-09-24 DIAGNOSIS — I10 Essential (primary) hypertension: Secondary | ICD-10-CM | POA: Diagnosis present

## 2013-09-24 DIAGNOSIS — R6510 Systemic inflammatory response syndrome (SIRS) of non-infectious origin without acute organ dysfunction: Secondary | ICD-10-CM | POA: Diagnosis present

## 2013-09-24 DIAGNOSIS — T368X5A Adverse effect of other systemic antibiotics, initial encounter: Secondary | ICD-10-CM | POA: Diagnosis not present

## 2013-09-24 DIAGNOSIS — Y9210 Unspecified residential institution as the place of occurrence of the external cause: Secondary | ICD-10-CM | POA: Diagnosis not present

## 2013-09-24 DIAGNOSIS — Z79899 Other long term (current) drug therapy: Secondary | ICD-10-CM

## 2013-09-24 DIAGNOSIS — E860 Dehydration: Secondary | ICD-10-CM | POA: Diagnosis present

## 2013-09-24 LAB — CBC WITH DIFFERENTIAL/PLATELET
Basophils Absolute: 0 10*3/uL (ref 0.0–0.1)
Eosinophils Absolute: 0.5 10*3/uL (ref 0.0–0.7)
Eosinophils Relative: 16 % — ABNORMAL HIGH (ref 0–5)
Lymphocytes Relative: 37 % (ref 12–46)
MCHC: 36.1 g/dL — ABNORMAL HIGH (ref 30.0–36.0)
Neutro Abs: 1.3 10*3/uL — ABNORMAL LOW (ref 1.7–7.7)
Neutrophils Relative %: 37 % — ABNORMAL LOW (ref 43–77)
Platelets: 145 10*3/uL — ABNORMAL LOW (ref 150–400)
RDW: 12.9 % (ref 11.5–15.5)
WBC: 3.4 10*3/uL — ABNORMAL LOW (ref 4.0–10.5)

## 2013-09-24 LAB — POCT I-STAT 3, VENOUS BLOOD GAS (G3P V)
Acid-base deficit: 3 mmol/L — ABNORMAL HIGH (ref 0.0–2.0)
Bicarbonate: 21.7 meq/L (ref 20.0–24.0)
O2 Saturation: 33 %
Patient temperature: 98.9
TCO2: 23 mmol/L (ref 0–100)
pCO2, Ven: 36.4 mmHg — ABNORMAL LOW (ref 45.0–50.0)
pH, Ven: 7.383 — ABNORMAL HIGH (ref 7.250–7.300)
pO2, Ven: 21 mmHg — CL (ref 30.0–45.0)

## 2013-09-24 LAB — URINALYSIS, ROUTINE W REFLEX MICROSCOPIC
Bilirubin Urine: NEGATIVE
Glucose, UA: NEGATIVE mg/dL
Hgb urine dipstick: NEGATIVE
Ketones, ur: 15 mg/dL — AB
Nitrite: NEGATIVE
Protein, ur: NEGATIVE mg/dL
Specific Gravity, Urine: 1.023 (ref 1.005–1.030)
Urobilinogen, UA: 1 mg/dL (ref 0.0–1.0)
pH: 6 (ref 5.0–8.0)

## 2013-09-24 LAB — URINE MICROSCOPIC-ADD ON

## 2013-09-24 LAB — CG4 I-STAT (LACTIC ACID): Lactic Acid, Venous: 1.7 mmol/L (ref 0.5–2.2)

## 2013-09-24 LAB — POCT I-STAT, CHEM 8
BUN: 11 mg/dL (ref 6–23)
Chloride: 98 mEq/L (ref 96–112)
Glucose, Bld: 109 mg/dL — ABNORMAL HIGH (ref 70–99)
Sodium: 132 mEq/L — ABNORMAL LOW (ref 135–145)
TCO2: 20 mmol/L (ref 0–100)

## 2013-09-24 LAB — LACTIC ACID, PLASMA: Lactic Acid, Venous: 1.6 mmol/L (ref 0.5–2.2)

## 2013-09-24 LAB — POCT I-STAT TROPONIN I: Troponin i, poc: 0 ng/mL (ref 0.00–0.08)

## 2013-09-24 LAB — MAGNESIUM: Magnesium: 1.9 mg/dL (ref 1.5–2.5)

## 2013-09-24 MED ORDER — SODIUM CHLORIDE 0.9 % IV SOLN
1000.0000 mL | INTRAVENOUS | Status: DC
  Administered 2013-09-24: 1000 mL via INTRAVENOUS

## 2013-09-24 MED ORDER — ACETAMINOPHEN 650 MG RE SUPP: 650.0000 mg | RECTAL | Status: DC | PRN

## 2013-09-24 MED ORDER — SODIUM CHLORIDE 0.9 % IV SOLN
1000.0000 mL | Freq: Once | INTRAVENOUS | Status: AC
  Administered 2013-09-24: 1000 mL via INTRAVENOUS

## 2013-09-24 MED ORDER — DEXTROSE 5 % IV SOLN: 2.0000 g | INTRAVENOUS | Status: DC

## 2013-09-24 MED ORDER — DEXTROSE 5 % IV SOLN
2.0000 g | Freq: Once | INTRAVENOUS | Status: AC
  Administered 2013-09-24: 2 g via INTRAVENOUS
  Filled 2013-09-24: qty 2

## 2013-09-24 MED ORDER — DEXTROSE 5 % IV SOLN: 1.0000 g | INTRAVENOUS | Status: DC

## 2013-09-24 MED ORDER — ACETAMINOPHEN 325 MG PO TABS
650.0000 mg | ORAL_TABLET | Freq: Four times a day (QID) | ORAL | Status: DC | PRN
  Administered 2013-09-24: 650 mg via ORAL
  Filled 2013-09-24: qty 2

## 2013-09-24 NOTE — Progress Notes (Signed)
ANTIBIOTIC CONSULT NOTE - INITIAL  Pharmacy Consult for Rocephin Indication: sepsis (probabe UTI source)  Allergies  Allergen Reactions  . Doxycycline Anaphylaxis  . Aspirin Nausea And Vomiting  . Penicillins Rash    Patient Measurements:    Vital Signs: Temp: 101.6 F (38.7 C) (11/09 2142) Temp src: Rectal (11/09 2142) Pulse Rate: 89 (11/09 2137) Intake/Output from previous day:   Intake/Output from this shift:    Labs: No results found for this basename: WBC, HGB, PLT, LABCREA, CREATININE,  in the last 72 hours The CrCl is unknown because both a height and weight (above a minimum accepted value) are required for this calculation. No results found for this basename: VANCOTROUGH, VANCOPEAK, VANCORANDOM, GENTTROUGH, GENTPEAK, GENTRANDOM, TOBRATROUGH, TOBRAPEAK, TOBRARND, AMIKACINPEAK, AMIKACINTROU, AMIKACIN,  in the last 72 hours   Microbiology: No results found for this or any previous visit (from the past 720 hour(s)).  Medical History: Past Medical History  Diagnosis Date  . Hypertension   . Diabetes mellitus   . Thyroid disease   . Claudication     Medications:  See electronic med rec  Assessment: 79 y.o. female presents to ED from home with c/p feeling funny. Noted to be in [place], placed on amio gtt. Pt recently diagnosed with UTI and on antibiotics PTA. To begin Rocephin for sepsis (probable urinary source).   Goal of Therapy:  Eradication of infection  Plan:  1. Rocephin 1gm IV q24h. 2. Pharmacy will sign off - no dosage adjustment needed for renal dysfunction. Please re-consult if needed.  Christoper Fabian, PharmD, BCPS Clinical pharmacist, pager [phone] 09/24/2013,9:48 PM

## 2013-09-24 NOTE — ED Provider Notes (Signed)
CSN: [id number]     Arrival date & time 09/24/13  2133 History   First MD Initiated Contact with Patient 09/24/13 2136     Chief Complaint  Patient presents with  . Tachycardia   (Consider location/radiation/quality/duration/timing/severity/associated sxs/prior Treatment) HPI History obtained from patient, family, EMS.   Pt was in her usual state of health with good functional status until 4 days ago when she say PCP and was diagnosed with UTI and started on antibiotic. Since that time pt with constant, worsening, weakness. Described as severe problem by patient, family. EMS called with complaint of "I feel funny." EMS reports episode of V Tach while enroute, gave 150 mg amiodarone. Modifying factors: no chest pain, no SOB, no emesis.  Associated symptoms: chills.     Past Medical History  Diagnosis Date  . Hypertension   . Diabetes mellitus   . Thyroid disease   . Claudication    Past Surgical History  Procedure Laterality Date  . Appendectomy    . Tonsillectomy     Family History  Problem Relation Age of Onset  . Heart failure Mother   . Lung disease Father     Black Lung disease   History  Substance Use Topics  . Smoking status: Never Smoker   . Smokeless tobacco: Not on file  . Alcohol Use: No   OB History   Grav Para Term Preterm Abortions TAB SAB Ect Mult Living                 Review of Systems Constitutional: Positive for chills.  Eyes: Negative for vision loss.  ENT: Negative for difficulty swallowing.  Cardiovascular: Negative for chest pain. Respiratory: Negative for respiratory distress.  Gastrointestinal:  Negative for vomiting.  Genitourinary: Negative for inability to void.  Musculoskeletal: Negative for gait problem.  Integumentary: Negative for rash.  Neurological: Negative for new focal weakness.     Allergies  Doxycycline; Aspirin; and Penicillins  Home Medications   No current outpatient prescriptions on file. BP 113/44  Pulse 72   Temp(Src) 98.2 F (36.8 C) (Oral)  Resp 20  Ht 5' (1.524 m)  Wt 141 lb 8.6 oz (64.2 kg)  BMI 27.64 kg/m2  SpO2 96% Physical Exam Nursing note and vitals reviewed.  Constitutional: Pt is alert and appears stated age. Pt with shivering.  Eyes: No injection, no scleral icterus. HENT: Atraumatic, airway open without erythema or exudate.  Respiratory: No respiratory distress. Equal breathing bilaterally. Cardiovascular: Normal rate. Extremities warm and well perfused.  Abdomen: Soft, non-tender. MSK: Extremities are atraumatic without deformity. Skin: No rash, no wounds.   Neuro: No motor nor sensory deficit. GCS 15.      ED Course  Procedures (including critical care time) Angiocath insertion Performed by: Charm Barges  Consent: Verbal consent obtained. Risks and benefits: risks, benefits and alternatives were discussed Time out: Immediately prior to procedure a "time out" was called to verify the correct patient, procedure, equipment, support staff and site/side marked as required.  Preparation: Patient was prepped and draped in the usual sterile fashion.  Vein Location: right arm  Ultrasound Guided  Gauge: 20 g  Normal blood return and flush without difficulty Patient tolerance: Patient tolerated the procedure well with no immediate complications.    Labs Review Labs Reviewed  CBC WITH DIFFERENTIAL - Abnormal; Notable for the following:    WBC 3.4 (*)    Hemoglobin 15.4 (*)    MCHC 36.1 (*)    Platelets 145 (*)  Neutrophils Relative % 37 (*)    Neutro Abs 1.3 (*)    Eosinophils Relative 16 (*)    All other components within normal limits  URINALYSIS, ROUTINE W REFLEX MICROSCOPIC - Abnormal; Notable for the following:    Color, Urine AMBER (*)    APPearance CLOUDY (*)    Ketones, ur 15 (*)    Leukocytes, UA TRACE (*)    All other components within normal limits  URINE MICROSCOPIC-ADD ON - Abnormal; Notable for the following:    Squamous Epithelial / LPF  FEW (*)    Bacteria, UA FEW (*)    All other components within normal limits  POCT I-STAT, CHEM 8 - Abnormal; Notable for the following:    Sodium 132 (*)    Glucose, Bld 109 (*)    Calcium, Ion 1.11 (*)    Hemoglobin 15.6 (*)    All other components within normal limits  POCT I-STAT 3, BLOOD GAS (G3P V) - Abnormal; Notable for the following:    pH, Ven 7.383 (*)    pCO2, Ven 36.4 (*)    pO2, Ven 21.0 (*)    Acid-base deficit 3.0 (*)    All other components within normal limits  CULTURE, BLOOD (ROUTINE X 2)  CULTURE, BLOOD (ROUTINE X 2)  URINE CULTURE  LACTIC ACID, PLASMA  MAGNESIUM  INFLUENZA PANEL BY PCR  TROPONIN I  TROPONIN I  TROPONIN I  COMPREHENSIVE METABOLIC PANEL  CBC WITH DIFFERENTIAL  BLOOD GAS, ARTERIAL  DIC (DISSEMINATED INTRAVASCULAR COAGULATION) PANEL  PATHOLOGIST SMEAR REVIEW  CK  ROCKY MTN SPOTTED FVR AB, IGM-BLOOD  CG4 I-STAT (LACTIC ACID)  POCT I-STAT TROPONIN I   Imaging Review Dg Chest Port 1 View  (if Code Sepsis Called)  09/24/2013   CLINICAL DATA:  Tachycardia  EXAM: PORTABLE CHEST - 1 VIEW  COMPARISON:  03/02/2011, 06/09/2013  FINDINGS: Heart size upper normal. Vascular pattern normal. Mild background interstitial change stable. No focal consolidation.  IMPRESSION: No active disease.   Electronically Signed   By: Esperanza Heir M.D.   On: 09/24/2013 22:05    EKG Interpretation     Ventricular Rate:    PR Interval:    QRS Duration:   QT Interval:    QTC Calculation:   R Axis:     Text Interpretation:              MDM   1. Fever   2. Hypothyroidism   3. Leucopenia   4. SIRS (systemic inflammatory response syndrome)   5. Thrombocytopenia    79 y.o. female w/ PMHx of HTN, DM, thyroid disease presents w/ weakness, reported V tach treated with amiodarone by EMS. Pt arrived stable, GCS 15, shivering. Rectal temp elevated. Given recent UTI diagnosis. Concerned for urosepsis, ordered rocephin, IVF. EKG NSR here. No signs of v tach on  monitor. Will continue cardiac monitor. Nursing unable to place second PIV so [place] used to place as above. CXR without pneumonia. CBC with WBC <4.Called critical care to discuss patient who feel pt does not require ICU admission at this time. Called medicine who will admit.       I independently viewed, interpreted, and used in my medical decision making all ordered lab and imaging tests. Medical Decision Making discussed with ED attending Dr. Redgie Grayer.      Charm Barges, MD 09/25/13 [phone]

## 2013-09-24 NOTE — ED Notes (Signed)
[place] MD at bedside.  

## 2013-09-24 NOTE — ED Notes (Signed)
Pt went to PCP on Wednesday and diagnosed UTI

## 2013-09-24 NOTE — ED Notes (Signed)
Dr Gregary Cromer notified of pt venous blood gas

## 2013-09-24 NOTE — ED Notes (Signed)
Presents from home with complaint of "I feel funny" EMS placed on monitor, VTach with a pulse noted, pt alert and oreinted, placed on AMiodarone 150 mg drip, now in sinus rhythm, denies chest pain, denies SOB, denies nausea, vomiting. Recently DX on WEds with a UTI, placed on antibiotics.  No cardiac HX. Pt is normally self sufficient at home, still drives and lives with family.

## 2013-09-25 ENCOUNTER — Encounter (HOSPITAL_COMMUNITY): Payer: Self-pay | Admitting: Internal Medicine

## 2013-09-25 DIAGNOSIS — E039 Hypothyroidism, unspecified: Secondary | ICD-10-CM | POA: Diagnosis present

## 2013-09-25 DIAGNOSIS — I10 Essential (primary) hypertension: Secondary | ICD-10-CM | POA: Diagnosis present

## 2013-09-25 DIAGNOSIS — D72819 Decreased white blood cell count, unspecified: Secondary | ICD-10-CM | POA: Diagnosis present

## 2013-09-25 DIAGNOSIS — I472 Ventricular tachycardia: Secondary | ICD-10-CM | POA: Diagnosis present

## 2013-09-25 DIAGNOSIS — E860 Dehydration: Secondary | ICD-10-CM | POA: Diagnosis present

## 2013-09-25 DIAGNOSIS — I519 Heart disease, unspecified: Secondary | ICD-10-CM

## 2013-09-25 DIAGNOSIS — R509 Fever, unspecified: Secondary | ICD-10-CM

## 2013-09-25 DIAGNOSIS — R6510 Systemic inflammatory response syndrome (SIRS) of non-infectious origin without acute organ dysfunction: Secondary | ICD-10-CM

## 2013-09-25 DIAGNOSIS — I878 Other specified disorders of veins: Secondary | ICD-10-CM | POA: Diagnosis present

## 2013-09-25 DIAGNOSIS — D696 Thrombocytopenia, unspecified: Secondary | ICD-10-CM

## 2013-09-25 LAB — GLUCOSE, CAPILLARY
Glucose-Capillary: 107 mg/dL — ABNORMAL HIGH (ref 70–99)
Glucose-Capillary: 148 mg/dL — ABNORMAL HIGH (ref 70–99)
Glucose-Capillary: 147 mg/dL — ABNORMAL HIGH (ref 70–99)

## 2013-09-25 LAB — URINE CULTURE
Colony Count: NO GROWTH
Culture: NO GROWTH

## 2013-09-25 LAB — TROPONIN I
Troponin I: <0.3 ng/mL (ref <0.30)
Troponin I: <0.3 ng/mL (ref <0.30)

## 2013-09-25 LAB — COMPREHENSIVE METABOLIC PANEL
AST: 39 U/L — ABNORMAL HIGH (ref 0–37)
CO2: 16 mEq/L — ABNORMAL LOW (ref 19–32)
Calcium: 7.2 mg/dL — ABNORMAL LOW (ref 8.4–10.5)
Creatinine, Ser: 0.69 mg/dL (ref 0.50–1.10)
GFR calc Af Amer: 86 mL/min — ABNORMAL LOW (ref >90)
GFR calc non Af Amer: 74 mL/min — ABNORMAL LOW (ref >90)
Glucose, Bld: 105 mg/dL — ABNORMAL HIGH (ref 70–99)
Potassium: 3.8 mEq/L (ref 3.5–5.1)
Sodium: 132 mEq/L — ABNORMAL LOW (ref 135–145)
Total Bilirubin: 0.4 mg/dL (ref 0.3–1.2)
Total Protein: 5.2 g/dL — ABNORMAL LOW (ref 6.0–8.3)

## 2013-09-25 LAB — CBC
MCH: 32.1 pg (ref 26.0–34.0)
MCHC: 35.7 g/dL (ref 30.0–36.0)
MCV: 89.8 fL (ref 78.0–100.0)
Platelets: 120 10*3/uL — ABNORMAL LOW (ref 150–400)
RBC: 3.74 MIL/uL — ABNORMAL LOW (ref 3.87–5.11)
RDW: 13 % (ref 11.5–15.5)
WBC: 3.9 10*3/uL — ABNORMAL LOW (ref 4.0–10.5)

## 2013-09-25 LAB — MRSA PCR SCREENING: MRSA by PCR: NEGATIVE

## 2013-09-25 LAB — INFLUENZA PANEL BY PCR (TYPE A & B)
H1N1 flu by pcr: NOT DETECTED
Influenza B By PCR: NEGATIVE

## 2013-09-25 MED ORDER — LEVOTHYROXINE SODIUM 75 MCG PO TABS
75.0000 ug | ORAL_TABLET | Freq: Every day | ORAL | Status: DC
  Administered 2013-09-25 – 2013-09-28 (×4): 75 ug via ORAL
  Filled 2013-09-25 (×5): qty 1

## 2013-09-25 MED ORDER — ACETAMINOPHEN 650 MG RE SUPP
650.0000 mg | Freq: Four times a day (QID) | RECTAL | Status: DC | PRN
  Administered 2013-09-25: 650 mg via RECTAL
  Filled 2013-09-25: qty 1

## 2013-09-25 MED ORDER — ONDANSETRON HCL 4 MG PO TABS: 4.0000 mg | ORAL_TABLET | Freq: Four times a day (QID) | ORAL | Status: DC | PRN

## 2013-09-25 MED ORDER — ACETAMINOPHEN 325 MG PO TABS
650.0000 mg | ORAL_TABLET | Freq: Four times a day (QID) | ORAL | Status: DC | PRN
  Administered 2013-09-27 (×2): 650 mg via ORAL
  Filled 2013-09-25 (×3): qty 2

## 2013-09-25 MED ORDER — INSULIN ASPART 100 UNIT/ML ~~LOC~~ SOLN
0.0000 [IU] | Freq: Three times a day (TID) | SUBCUTANEOUS | Status: DC
  Administered 2013-09-26: 2 [IU] via SUBCUTANEOUS
  Administered 2013-09-26 – 2013-09-28 (×7): 1 [IU] via SUBCUTANEOUS
  Administered 2013-09-29 – 2013-10-01 (×6): 2 [IU] via SUBCUTANEOUS
  Administered 2013-10-02: 13:00:00 1 [IU] via SUBCUTANEOUS

## 2013-09-25 MED ORDER — SODIUM CHLORIDE 0.9 % IJ SOLN
10.0000 mL | INTRAMUSCULAR | Status: DC | PRN
  Administered 2013-09-30 – 2013-10-01 (×3): 10 mL

## 2013-09-25 MED ORDER — IMIPENEM-CILASTATIN 250 MG IV SOLR
250.0000 mg | Freq: Three times a day (TID) | INTRAVENOUS | Status: DC
  Administered 2013-09-25 – 2013-09-26 (×5): 250 mg via INTRAVENOUS
  Filled 2013-09-25 (×8): qty 250

## 2013-09-25 MED ORDER — OCUVITE-LUTEIN PO CAPS
1.0000 | ORAL_CAPSULE | Freq: Every day | ORAL | Status: DC
  Administered 2013-09-25 – 2013-09-28 (×3): 1 via ORAL
  Filled 2013-09-25 (×5): qty 1

## 2013-09-25 MED ORDER — WHITE PETROLATUM GEL
Status: AC
  Administered 2013-09-25: 0.2
  Filled 2013-09-25: qty 5

## 2013-09-25 MED ORDER — VANCOMYCIN HCL IN DEXTROSE 750-5 MG/150ML-% IV SOLN
750.0000 mg | INTRAVENOUS | Status: DC
  Administered 2013-09-26: 750 mg via INTRAVENOUS
  Filled 2013-09-25 (×2): qty 150

## 2013-09-25 MED ORDER — ONDANSETRON HCL 4 MG/2ML IJ SOLN
4.0000 mg | Freq: Four times a day (QID) | INTRAMUSCULAR | Status: DC | PRN
  Administered 2013-09-27 – 2013-10-01 (×3): 4 mg via INTRAVENOUS
  Filled 2013-09-25 (×3): qty 2

## 2013-09-25 MED ORDER — SODIUM CHLORIDE 0.9 % IV SOLN
INTRAVENOUS | Status: DC
  Administered 2013-09-25 – 2013-09-27 (×3): via INTRAVENOUS

## 2013-09-25 MED ORDER — SODIUM CHLORIDE 0.9 % IJ SOLN
10.0000 mL | Freq: Two times a day (BID) | INTRAMUSCULAR | Status: DC
  Administered 2013-09-25 – 2013-09-28 (×4): 10 mL

## 2013-09-25 MED ORDER — VANCOMYCIN HCL IN DEXTROSE 750-5 MG/150ML-% IV SOLN
750.0000 mg | Freq: Once | INTRAVENOUS | Status: AC
  Administered 2013-09-25: 750 mg via INTRAVENOUS
  Filled 2013-09-25: qty 150

## 2013-09-25 MED ORDER — PRIMIDONE 250 MG PO TABS
125.0000 mg | ORAL_TABLET | Freq: Every day | ORAL | Status: DC
  Administered 2013-09-25 – 2013-09-27 (×4): 125 mg via ORAL
  Filled 2013-09-25 (×6): qty 1

## 2013-09-25 MED ORDER — SODIUM CHLORIDE 0.9 % IJ SOLN
3.0000 mL | Freq: Two times a day (BID) | INTRAMUSCULAR | Status: DC
  Administered 2013-09-25 – 2013-09-28 (×7): 3 mL via INTRAVENOUS

## 2013-09-25 MED ORDER — OMEGA-3-ACID ETHYL ESTERS 1 G PO CAPS
1.0000 g | ORAL_CAPSULE | Freq: Every day | ORAL | Status: DC
  Administered 2013-09-25 – 2013-09-28 (×3): 1 g via ORAL
  Filled 2013-09-25 (×4): qty 1

## 2013-09-25 MED ORDER — CALCIUM CARBONATE-VITAMIN D 500-200 MG-UNIT PO TABS
1.0000 | ORAL_TABLET | Freq: Two times a day (BID) | ORAL | Status: DC
  Administered 2013-09-25 – 2013-09-28 (×6): 1 via ORAL
  Filled 2013-09-25 (×11): qty 1

## 2013-09-25 NOTE — Progress Notes (Signed)
Patient and family refused lab drawn, stated her vein are too hard to find, spoke to Lenny Pastel, discussed the possibility of drawing the ABG's and using blood from that artery. I explain to the patient that we would not be getting blood from her vein, she still refused. No acute distress at this time.

## 2013-09-25 NOTE — Progress Notes (Signed)
Peripherally Inserted Central Catheter/Midline Placement  The IV Nurse has discussed with the patient and/or persons authorized to consent for the patient, the purpose of this procedure and the potential benefits and risks involved with this procedure.  The benefits include less needle sticks, lab draws from the catheter and patient may be discharged home with the catheter.  Risks include, but not limited to, infection, bleeding, blood clot (thrombus formation), and puncture of an artery; nerve damage and irregular heat beat.  Alternatives to this procedure were also discussed.  PICC/Midline Placement Documentation  PICC / Midline Double Lumen 09/25/13 PICC Right Basilic 39 cm 0 cm (Active)  Indication for Insertion or Continuance of Line Poor Vasculature-patient has had multiple peripheral attempts or PIVs lasting less than 24 hours 09/25/2013  7:00 PM  Exposed Catheter (cm) 0 cm 09/25/2013  7:00 PM  Dressing Change Due 10/02/13 09/25/2013  7:00 PM       Stacie Glaze Horton 09/25/2013, 7:23 PM

## 2013-09-25 NOTE — Progress Notes (Signed)
[facility] Progress Note [name] ZOX:[id number] DOB: 1920-09-13 DOA: 09/24/2013 PCP: Willow Ora, MD  Brief narrative: 79 y.o. female was brought to the ER because of increased weakness and was found to be febrile. Patient had recently been treated in the past week by her PCP for possible UTI and was placed on Bactrim. On the following day she became very weak and had not been walking well since then. Patient did not have any nausea, vomiting, abdominal pain, diarrhea, chest pain, shortness of breath or productive cough. Since that time she had become extremely weak and was unable to ambulate. The patient was brought to the ER and found to be febrile with leukopenia. The patient was thought to be septic so the critical care service was consulted but felt pt stable so recommended hospitalist admission. Apparently en route to the hospital the patient had possible V tach for which EMS had given one dose of amiodarone 150 mg IV. EKG in the ER demonstrated normal sinus rhythm with QTC of 418. Troponins were negative and patient denied any chest pain or shortness of breath. Patient stated that over the last 4-5 days patient's chronic tremors had worsened.   Assessment/Plan:    SIRS (systemic inflammatory response syndrome) -barely met criteria -infectious source not clear but ? UTI (recent OP tx w/ Bactrim) - could also be viral -provide supportive care -FU on cx's    Leukopenia -suspect due to infection -follow    Dehydration with hyponatremia -baseline Hgb 12 -cont IVF and follow lytes    Type 2 diabetes mellitus -controlled -cont SSI -HOLD Metformin    Thrombocytopenia -chronic and stable    ?? Wide-complex tachycardia -could not find any rhythm strips to confirm -FU on ECHO -kepp K > 4.0 as a precaution    Hypothyroidism -Synthroid    Poor venous access -family refused lab sticks this am -likely compounded by  [facility] -place PICC for fluids and labs   DVT prophylaxis:  SCDs Code Status: Full Family Communication: No family available during rounding time Disposition Plan/Expected LOS: Stepdown  Consultants: None  Procedures: 2-D echocardiogram  Antibiotics: Rocephin 11/9 >>> 11/9 Primaxin 11/9 >>> Vancomycin 11/9 >>>  HPI/Subjective: Patient awake but seems slow to respond. No apparent chest pain or shortness of breath. Remains tremulous and seems quite weak.  Objective: Blood pressure 136/54, pulse 88, temperature 99.8 F (37.7 C), temperature source Oral, resp. rate 24, height 5' (1.524 m), weight 141 lb 8.6 oz (64.2 kg), SpO2 95.00%.  Intake/Output Summary (Last 24 hours) at 09/25/13 1223 Last data filed at 09/25/13 1153  Gross per 24 hour  Intake 3409.58 ml  Output    925 ml  Net 2484.58 ml   Exam: Follow up exam completed. Patient admitted at 12:36 AM today.  Scheduled Meds:  Scheduled Meds: . calcium-vitamin D  1 tablet Oral BID WC  . imipenem-cilastatin  250 mg Intravenous Q8H  . insulin aspart  0-9 Units Subcutaneous TID WC  . levothyroxine  75 mcg Oral QAC breakfast  . multivitamin-lutein  1 capsule Oral Daily  . omega-3 acid ethyl esters  1 g Oral Daily  . primidone  125 mg Oral QHS  . sodium chloride  3 mL Intravenous Q12H  . [START ON 09/26/2013] vancomycin  750 mg Intravenous Q24H   Data Reviewed: Basic Metabolic Panel:  Recent Labs Lab 09/24/13 2216 09/24/13 2234  NA  --  132*  K  --  4.0  CL  --  98  GLUCOSE  --  109*  BUN  --  11  CREATININE  --  1.10  MG 1.9  --    Liver Function Tests: No results found for this basename: AST, ALT, ALKPHOS, BILITOT, PROT, ALBUMIN,  in the last 168 hours No results found for this basename: LIPASE, AMYLASE,  in the last 168 hours No results found for this basename: AMMONIA,  in the last 168 hours CBC:  Recent Labs Lab 09/24/13 2216 09/24/13 2234  WBC 3.4*  --   NEUTROABS 1.3*  --   HGB 15.4* 15.6*  HCT  42.7 46.0  MCV 90.1  --   PLT 145*  --    CBG:  Recent Labs Lab 09/25/13 0813 09/25/13 1145  GLUCAP 107* 121*    Recent Results (from the past 240 hour(s))  MRSA PCR SCREENING     Status: None   Collection Time    09/25/13  1:30 AM      Result Value Range Status   MRSA by PCR NEGATIVE  NEGATIVE Final   Comment:            The GeneXpert MRSA Assay (FDA     approved for NASAL specimens     only), is one component of a     comprehensive MRSA colonization     surveillance program. It is not     intended to diagnose MRSA     infection nor to guide or     monitor treatment for     MRSA infections.     Studies:  Recent x-ray studies have been reviewed in detail by the Attending Physician     Junious Silk, ANP [facility] Office  [phone] Pager [phone]  **If unable to reach the above provider after paging please contact the Flow Manager @ [phone]  On-Call/Text Page:      [email or web address]      password [email or web address]  If 7PM-7AM, please contact night-coverage [email or web address] Password [email or web address] 09/25/2013, 12:23 PM   LOS: 1 day   I have personally examined this patient and reviewed the entire database. I have reviewed the above note, made any necessary editorial changes, and agree with its content.  Lonia Blood, MD [facility]

## 2013-09-25 NOTE — ED Provider Notes (Addendum)
I saw and evaluated the patient, reviewed the resident's note and I agree with the findings and plan.  US guided peripheral IV performed by resident and supervised by me.  No complications.    Date: 09/25/2013  Rate: 83  Rhythm: normal sinus rhythm  QRS Axis: normal  Intervals: normal  ST/T Wave abnormalities: normal  Conduction Disutrbances:none  Narrative Interpretation: low voltage  Old EKG Reviewed: unchanged  Admit for fever, SIRS, hypothyroid, thrombocytopenia, leukopenia.  VSS in ER.  Pt and family are aware.      Darlys Gales, MD 09/25/13 1610  Darlys Gales, MD 10/03/13 [id number]

## 2013-09-25 NOTE — H&P (Addendum)
[facility] History and Physical  [name] Colon ZOX:[id number] DOB: Aug 18, 1920 DOA: 09/24/2013  Referring physician: ER physician. PCP: Willow Ora, MD   Chief Complaint: Weakness.  HPI: [name] Colon is a 79 y.o. female was brought to the ER because of increased weakness and was found to be febrile. Patient was recently treated last week by patient's primary care physician for possible UTI and was placed on Bactrim. Patient had gone to her PCP last week 5 days ago with complaints of weakness and was found to have possible UTI. She was prescribed Bactrim. Following day patient became very weak and has not been walking well since then. Patient did not have any nausea vomiting abdominal pain diarrhea chest pain shortness of breath productive cough. Since patient has become extremely weak and unable to ambulate patient was brought to the ER. As patient also was found to be febrile and having leukopenia patient was thought to be having sepsis and critical care was consulted but at this time they have recommended hospitalist admission. En route to the hospital patient had possible V. tach for which EMS had given one dose of amiodarone 150 mg IV. EKG in the ER was showing normal sinus rhythm with QTC of 418. Troponins were negative and patient denies any chest pain or shortness of breath. Patient states that over the last 4-5 days patient's known history of tremors had worsened.   Review of Systems: As presented in the history of presenting illness, rest negative.  Past Medical History  Diagnosis Date  . Hypertension   . Diabetes mellitus   . Thyroid disease   . Claudication    Past Surgical History  Procedure Laterality Date  . Appendectomy    . Tonsillectomy     Social History:  reports that she has never smoked. She does not have any smokeless tobacco history on file. She reports that she does not drink alcohol or use illicit drugs. Where does patient live home. Can patient  participate in ADLs? Not sure.  Allergies  Allergen Reactions  . Doxycycline Anaphylaxis  . Aspirin Nausea And Vomiting  . Penicillins Rash    Family History:  Family History  Problem Relation Age of Onset  . Heart failure Mother   . Lung disease Father     Black Lung disease      Prior to Admission medications   Medication Sig Start Date End Date Taking? Authorizing Provider  alendronate (FOSAMAX) 70 MG tablet Take 70 mg by mouth once a week. Take with a full glass of water on an empty stomach.   Yes Historical Provider, MD  calcium-vitamin D (OSCAL WITH D) 500-200 MG-UNIT per tablet Take 1 tablet by mouth 2 (two) times daily.   Yes Historical Provider, MD  Cholecalciferol 2000 UNITS CAPS Take 2,000 Units by mouth daily.   Yes Historical Provider, MD  fish oil-omega-3 fatty acids 1000 MG capsule Take 2 g by mouth daily.   Yes Historical Provider, MD  hydrOXYzine (VISTARIL) 25 MG capsule Take 25 mg by mouth 3 (three) times daily as needed for itching.   Yes Historical Provider, MD  levothyroxine (SYNTHROID, LEVOTHROID) 75 MCG tablet Take 75 mcg by mouth daily before breakfast.   Yes Historical Provider, MD  metFORMIN (GLUMETZA) 500 MG (MOD) 24 hr tablet Take 500 mg by mouth daily with breakfast.   Yes Historical Provider, MD  Multiple Vitamins-Minerals (MULTIVITAMIN WITH MINERALS) tablet Take 1 tablet by mouth daily.   Yes Historical Provider, MD  multivitamin-lutein (OCUVITE-LUTEIN)  CAPS Take 1 capsule by mouth daily.   Yes Historical Provider, MD  OVER THE COUNTER MEDICATION Take 1 tablet by mouth daily as needed (for allergies    otc allergy relief tablet).   Yes Historical Provider, MD  potassium chloride SA (K-DUR,KLOR-CON) 20 MEQ tablet Take 20 mEq by mouth daily.    Yes Historical Provider, MD  primidone (MYSOLINE) 250 MG tablet Take 125 mg by mouth at bedtime.    Yes Historical Provider, MD  Ranitidine HCl (ZANTAC PO) Take 1 tablet by mouth daily as needed (for indigestion).    Yes Historical Provider, MD  sulfamethoxazole-trimethoprim (BACTRIM DS) 800-160 MG per tablet Take 1 tablet by mouth 2 (two) times daily.   Yes Historical Provider, MD  torsemide (DEMADEX) 20 MG tablet Take 20 mg by mouth daily.   Yes Historical Provider, MD    Physical Exam: Filed Vitals:   09/24/13 2245 09/24/13 2300 09/24/13 2311 09/24/13 2315  BP: 105/42 96/77  113/44  Pulse: 75 72  72  Temp:   98.2 F (36.8 C) 99.9 F (37.7 C)  TempSrc:   Oral Core (Comment)  Resp: 20 22  20   SpO2: 94% 97%  96%     General:  Well-developed well-nourished.  Eyes: Anicteric no pallor.  ENT: No discharge from ears eyes nose mouth.  Neck: No mass felt. No neck rigidity.  Cardiovascular: S1-S2 heard.  Respiratory: No rhonchi or crepitations.  Abdomen: Soft nontender bowel sounds present.  Skin: No rash.  Musculoskeletal: No edema.  Psychiatric: Appears normal.  Neurologic: Alert awake oriented to time place and person. Moves all extremities.  Labs on Admission:  Basic Metabolic Panel:  Recent Labs Lab 09/24/13 2216 09/24/13 2234  NA  --  132*  K  --  4.0  CL  --  98  GLUCOSE  --  109*  BUN  --  11  CREATININE  --  1.10  MG 1.9  --    Liver Function Tests: No results found for this basename: AST, ALT, ALKPHOS, BILITOT, PROT, ALBUMIN,  in the last 168 hours No results found for this basename: LIPASE, AMYLASE,  in the last 168 hours No results found for this basename: AMMONIA,  in the last 168 hours CBC:  Recent Labs Lab 09/24/13 2216 09/24/13 2234  WBC 3.4*  --   NEUTROABS 1.3*  --   HGB 15.4* 15.6*  HCT 42.7 46.0  MCV 90.1  --   PLT 145*  --    Cardiac Enzymes: No results found for this basename: CKTOTAL, CKMB, CKMBINDEX, TROPONINI,  in the last 168 hours  BNP (last 3 results) No results found for this basename: PROBNP,  in the last 8760 hours CBG: No results found for this basename: GLUCAP,  in the last 168 hours  Radiological Exams on Admission: Dg  Chest Port 1 View  (if Code Sepsis Called)  09/24/2013   CLINICAL DATA:  Tachycardia  EXAM: PORTABLE CHEST - 1 VIEW  COMPARISON:  03/02/2011, 06/09/2013  FINDINGS: Heart size upper normal. Vascular pattern normal. Mild background interstitial change stable. No focal consolidation.  IMPRESSION: No active disease.   Electronically Signed   By: Esperanza Heir M.D.   On: 09/24/2013 22:05    EKG: Independently reviewed. Normal sinus rhythm.  Assessment/Plan Active Problems:   Type 2 diabetes mellitus   SIRS (systemic inflammatory response syndrome)   Thrombocytopenia   Hypothyroidism   Leucopenia   1. SIRS - source not clear. At this time patient has  been placed empirically on vancomycin and Primaxin. Follow urine cultures and blood cultures. I have also ordered influenza panel. Since patient has mild thrombocytopenia RMSF titer has been ordered but patient has not been placed on doxycycline because she is allergic to doxycycline and suspicion for RMSF is low. Continue with hydration. 2. Brief run of V. tach en route - rhythm strip shows possible of torsades. Check 2-D echo and closely monitor. Cycle cardiac markers. Magnesium levels are normal. 3. Generalized weakness - check CK levels, TSH. Patient is nonfocal and on exam patient has good deep tendon reflexes and patient denies any tingling or numbness. Closely observe for now. May be related to #1. 4. Leukopenia and thrombocytopenia - may be related to infectious process. See #1. Closely follow CBC. Check DIC panel. Could also be related to Bactrim. 5. Diabetes mellitus type 2 - hold metformin for now. 6. Hypothyroidism - check TSH.  Repeat labs including liver function test, CK levels cardiac markers TSH metabolic panel CBC are all pending.  Code Status: Full code.  Family Communication: Patient's son.  Disposition Plan: Admit to inpatient.    Chandrika Sandles N. [facility] Pager [phone].  If 7PM-7AM, please contact  night-coverage [email or web address] Password [facility] 09/25/2013, 12:37 AM

## 2013-09-25 NOTE — Progress Notes (Signed)
ANTIBIOTIC CONSULT NOTE - INITIAL  Pharmacy Consult for Vancocin and Primaxin Indication: rule out sepsis  Allergies  Allergen Reactions  . Doxycycline Anaphylaxis  . Aspirin Nausea And Vomiting  . Penicillins Rash    Patient Measurements: Height: 5' (152.4 cm) Weight: 141 lb 8.6 oz (64.2 kg) IBW/kg (Calculated) : 45.5  Vital Signs: Temp: 98.2 F (36.8 C) (11/10 0108) Temp src: Oral (11/10 0108) BP: 113/44 mmHg (11/09 2315) Pulse Rate: 72 (11/09 2315)  Labs:  Recent Labs  09/24/13 2216 09/24/13 2234  WBC 3.4*  --   HGB 15.4* 15.6*  PLT 145*  --   CREATININE  --  1.10   Estimated Creatinine Clearance: 27.9 ml/min (by C-G formula based on Cr of 1.1).   Microbiology: No results found for this or any previous visit (from the past 720 hour(s)).  Medical History: Past Medical History  Diagnosis Date  . Hypertension   . Diabetes mellitus   . Thyroid disease   . Claudication     Medications:  Prescriptions prior to admission  Medication Sig Dispense Refill  . alendronate (FOSAMAX) 70 MG tablet Take 70 mg by mouth once a week. Take with a full glass of water on an empty stomach.      . calcium-vitamin D (OSCAL WITH D) 500-200 MG-UNIT per tablet Take 1 tablet by mouth 2 (two) times daily.      . Cholecalciferol 2000 UNITS CAPS Take 2,000 Units by mouth daily.      . fish oil-omega-3 fatty acids 1000 MG capsule Take 2 g by mouth daily.      . hydrOXYzine (VISTARIL) 25 MG capsule Take 25 mg by mouth 3 (three) times daily as needed for itching.      . levothyroxine (SYNTHROID, LEVOTHROID) 75 MCG tablet Take 75 mcg by mouth daily before breakfast.      . metFORMIN (GLUMETZA) 500 MG (MOD) 24 hr tablet Take 500 mg by mouth daily with breakfast.      . Multiple Vitamins-Minerals (MULTIVITAMIN WITH MINERALS) tablet Take 1 tablet by mouth daily.      . multivitamin-lutein (OCUVITE-LUTEIN) CAPS Take 1 capsule by mouth daily.      Marland Kitchen OVER THE COUNTER MEDICATION Take 1 tablet by  mouth daily as needed (for allergies    otc allergy relief tablet).      . potassium chloride SA (K-DUR,KLOR-CON) 20 MEQ tablet Take 20 mEq by mouth daily.       . primidone (MYSOLINE) 250 MG tablet Take 125 mg by mouth at bedtime.       . Ranitidine HCl (ZANTAC PO) Take 1 tablet by mouth daily as needed (for indigestion).      Marland Kitchen sulfamethoxazole-trimethoprim (BACTRIM DS) 800-160 MG per tablet Take 1 tablet by mouth 2 (two) times daily.      Marland Kitchen torsemide (DEMADEX) 20 MG tablet Take 20 mg by mouth daily.       Scheduled:  . calcium-vitamin D  1 tablet Oral BID WC  . imipenem-cilastatin  250 mg Intravenous Q8H  . insulin aspart  0-9 Units Subcutaneous TID WC  . levothyroxine  75 mcg Oral QAC breakfast  . multivitamin-lutein  1 capsule Oral Daily  . omega-3 acid ethyl esters  1 g Oral Daily  . primidone  125 mg Oral QHS  . sodium chloride  3 mL Intravenous Q12H  . vancomycin  750 mg Intravenous Once  . [START ON 09/26/2013] vancomycin  750 mg Intravenous Q24H   Infusions:  . sodium  chloride 1,000 mL (09/24/13 2331)  . sodium chloride      Assessment: 79yo female c/o "feeling funny", was in [place] on EMS monitor, recently Dx'd w/ UTI and started on Bactrim, now in SIRS, to broaden IV ABX.  Goal of Therapy:  Vancomycin trough level 15-20 mcg/ml  Plan:  Will begin vancomycin 750mg  IV Q24H and Primaxin 250mg  IV Q8H and monitor CBC, Cx, levels prn.  Vernard Gambles, PharmD, BCPS  09/25/2013,1:19 AM

## 2013-09-25 NOTE — Plan of Care (Signed)
Due to poor venous access would like IV team to place PICC line to use for IVFs and lab draws if family amenable- will go ahead and place order now to expedite.  Junious Silk, ANP

## 2013-09-25 NOTE — Progress Notes (Signed)
  Echocardiogram Echocardiogram Transesophageal has been performed.  [name] Colon 09/25/2013, 11:23 AM

## 2013-09-25 NOTE — Progress Notes (Signed)
Chaplain to visit patient. Welcomed patient; offered spiritual comfort. Prayed with patient.   Chaplain Jones Skene

## 2013-09-25 NOTE — Progress Notes (Signed)
Pt diaphoretic and tremulous throughout shift. Tylenol given with good effect.PICC placed at 1800. Son updated on progress. He expresses concerns about his mother's shaking.

## 2013-09-25 NOTE — Progress Notes (Signed)
Patient and patient's family refuses ABG. Patient's is agitated and shaking.

## 2013-09-26 DIAGNOSIS — E119 Type 2 diabetes mellitus without complications: Secondary | ICD-10-CM

## 2013-09-26 DIAGNOSIS — E871 Hypo-osmolality and hyponatremia: Secondary | ICD-10-CM

## 2013-09-26 LAB — CBC WITH DIFFERENTIAL/PLATELET
Eosinophils Absolute: 0.6 10*3/uL (ref 0.0–0.7)
HCT: 35.1 % — ABNORMAL LOW (ref 36.0–46.0)
Hemoglobin: 12.4 g/dL (ref 12.0–15.0)
Lymphocytes Relative: 36 % (ref 12–46)
Lymphs Abs: 1.5 10*3/uL (ref 0.7–4.0)
MCH: 31.7 pg (ref 26.0–34.0)
Monocytes Absolute: 0.4 10*3/uL (ref 0.1–1.0)
Monocytes Relative: 10 % (ref 3–12)
Neutro Abs: 1.7 10*3/uL (ref 1.7–7.7)
Neutrophils Relative %: 41 % — ABNORMAL LOW (ref 43–77)
RBC: 3.91 MIL/uL (ref 3.87–5.11)

## 2013-09-26 LAB — GLUCOSE, CAPILLARY
Glucose-Capillary: 127 mg/dL — ABNORMAL HIGH (ref 70–99)
Glucose-Capillary: 137 mg/dL — ABNORMAL HIGH (ref 70–99)

## 2013-09-26 LAB — BASIC METABOLIC PANEL
BUN: 6 mg/dL (ref 6–23)
Chloride: 103 mEq/L (ref 96–112)
Glucose, Bld: 128 mg/dL — ABNORMAL HIGH (ref 70–99)
Potassium: 3.6 mEq/L (ref 3.5–5.1)

## 2013-09-26 LAB — PROCALCITONIN: Procalcitonin: 0.12 ng/mL

## 2013-09-26 MED ORDER — DIPHENHYDRAMINE HCL 50 MG/ML IJ SOLN
25.0000 mg | Freq: Once | INTRAMUSCULAR | Status: AC
  Administered 2013-09-26: 25 mg via INTRAVENOUS
  Filled 2013-09-26: qty 1

## 2013-09-26 MED ORDER — DICYCLOMINE HCL 20 MG PO TABS
20.0000 mg | ORAL_TABLET | Freq: Once | ORAL | Status: AC
  Administered 2013-09-26: 20 mg via ORAL
  Filled 2013-09-26: qty 1

## 2013-09-26 MED ORDER — CAMPHOR-MENTHOL 0.5-0.5 % EX LOTN
TOPICAL_LOTION | CUTANEOUS | Status: DC | PRN
  Administered 2013-09-27 (×2): 1 via TOPICAL
  Filled 2013-09-26 (×2): qty 222

## 2013-09-26 MED ORDER — LEVOFLOXACIN IN D5W 750 MG/150ML IV SOLN
750.0000 mg | INTRAVENOUS | Status: DC
  Administered 2013-09-26 – 2013-09-28 (×2): 750 mg via INTRAVENOUS
  Filled 2013-09-26 (×3): qty 150

## 2013-09-26 MED ORDER — SODIUM CHLORIDE 0.9 % IV SOLN
40.0000 mg | Freq: Once | INTRAVENOUS | Status: AC
  Administered 2013-09-26: 40 mg via INTRAVENOUS
  Filled 2013-09-26: qty 4

## 2013-09-26 MED ORDER — DIPHENHYDRAMINE HCL 25 MG PO CAPS
25.0000 mg | ORAL_CAPSULE | ORAL | Status: DC | PRN
  Administered 2013-09-27: 25 mg via ORAL
  Filled 2013-09-26: qty 1

## 2013-09-26 NOTE — Progress Notes (Signed)
[facility] Progress Note [name] TEAM 1 - Stepdown ICU Team   [name] Colon WUJ:[id number] DOB: [birth year] DOA: 09/24/2013 PCP: Willow Ora, MD  Brief narrative: 79 y.o. female was brought to the ER because of increased weakness and was found to be febrile. Patient had recently been treated in the past week by her PCP for possible UTI and was placed on Bactrim. On the following day she became very weak and had not been walking well since then. Patient did not have any nausea, vomiting, abdominal pain, diarrhea, chest pain, shortness of breath or productive cough. Since that time she had become extremely weak and was unable to ambulate. The patient was brought to the ER and found to be febrile with leukopenia. The patient was thought to be septic so the critical care service was consulted but felt pt stable so recommended hospitalist admission. Apparently en route to the hospital the patient had possible V tach for which EMS had given one dose of amiodarone 150 mg IV. EKG in the ER demonstrated normal sinus rhythm with QTC of 418. Troponins were negative and patient denied any chest pain or shortness of breath. Patient stated that over the last 4-5 days patient's chronic tremors had worsened.   Assessment/Plan:    SIRS (systemic inflammatory response syndrome) -barely met criteria -infectious source not clear but ? UTI (recent OP tx w/ Bactrim) - could also be viral -provide supportive care -urine culture with no growth -blood cxs NGTD   Suspected drug reaction -earlier on exam pt appeared flushed and hands were red and swollen -later RN called w/ report pt itching -has PCN allergy so will dc Primaxin as a precaution- also could be red man syndrome from Vanco so will dc this too -1x doses of Pepcid and Benadryl- if sx's persist will need IV Solumedrol    Leukopenia -suspect due to infection -improved    Dehydration with hyponatremia -baseline Hgb 12 -cont IVF and follow  lytes    Type 2 diabetes mellitus -controlled -cont SSI -HOLD Metformin    Thrombocytopenia -chronic and stable    ?? Wide-complex tachycardia -could not find any rhythm strips to confirm -FU on ECHO -kepp K > 4.0 as a precaution    Hypothyroidism -Synthroid    Poor venous access -family refused lab sticks this am -likely compounded by [facility] -place PICC for fluids and labs   DVT prophylaxis:  SCDs Code Status: Full Family Communication: No family available during rounding time Disposition Plan/Expected LOS: Stepdown  Consultants: None  Procedures: 2-D echocardiogram  Antibiotics: Rocephin 11/9 >>> 11/9 Primaxin 11/9 >>> Vancomycin 11/9 >>>  HPI/Subjective: Patient awake but seems slow to respond. No apparent chest pain or shortness of breath. Remains tremulous and seems quite weak.  Objective: Blood pressure 147/48, pulse 75, temperature 98 F (36.7 C), temperature source Oral, resp. rate 24, height 5' (1.524 m), weight 142 lb 6.7 oz (64.6 kg), SpO2 91.00%.  Intake/Output Summary (Last 24 hours) at 09/26/13 1416 Last data filed at 09/26/13 1206  Gross per 24 hour  Intake   3205 ml  Output   1550 ml  Net   1655 ml   Exam: Follow up exam completed. Patient admitted at 12:36 AM today.  Scheduled Meds:  Scheduled Meds: . calcium-vitamin D  1 tablet Oral BID WC  . imipenem-cilastatin  250 mg Intravenous Q8H  . insulin aspart  0-9 Units Subcutaneous TID WC  . levothyroxine  75 mcg Oral QAC breakfast  . multivitamin-lutein  1  capsule Oral Daily  . omega-3 acid ethyl esters  1 g Oral Daily  . primidone  125 mg Oral QHS  . sodium chloride  10-40 mL Intracatheter Q12H  . sodium chloride  3 mL Intravenous Q12H  . vancomycin  750 mg Intravenous Q24H   Data Reviewed: Basic Metabolic Panel:  Recent Labs Lab 09/24/13 2216 09/24/13 2234 09/25/13 1300 09/26/13 0500  NA  --  132* 132* 132*  K  --  4.0 3.8 3.6  CL  --  98 105 103  CO2  --   --  16* 20    GLUCOSE  --  109* 105* 128*  BUN  --  11 10 6   CREATININE  --  1.10 0.69 0.54  CALCIUM  --   --  7.2* 7.4*  MG 1.9  --   --   --    Liver Function Tests:  Recent Labs Lab 09/25/13 1300  AST 39*  ALT 30  ALKPHOS 36*  BILITOT 0.4  PROT 5.2*  ALBUMIN 2.3*   No results found for this basename: LIPASE, AMYLASE,  in the last 168 hours No results found for this basename: AMMONIA,  in the last 168 hours CBC:  Recent Labs Lab 09/24/13 2216 09/24/13 2234 09/25/13 1300 09/26/13 0500  WBC 3.4*  --  3.9* 4.1  NEUTROABS 1.3*  --   --  1.7  HGB 15.4* 15.6* 12.0 12.4  HCT 42.7 46.0 33.6* 35.1*  MCV 90.1  --  89.8 89.8  PLT 145*  --  120* 120*   CBG:  Recent Labs Lab 09/25/13 1145 09/25/13 1731 09/25/13 2052 09/26/13 0811 09/26/13 1210  GLUCAP 121* 148* 147* 127* 147*    Recent Results (from the past 240 hour(s))  CULTURE, BLOOD (ROUTINE X 2)     Status: None   Collection Time    09/24/13 10:00 PM      Result Value Range Status   Specimen Description BLOOD LEFT ARM   Final   Special Requests BOTTLES DRAWN AEROBIC ONLY 10CC   Final   Culture  Setup Time     Final   Value: 09/25/2013 08:54     Performed at [facility]   Culture     Final   Value:        BLOOD CULTURE RECEIVED NO GROWTH TO DATE CULTURE WILL BE HELD FOR 5 DAYS BEFORE ISSUING A FINAL NEGATIVE REPORT     Performed at [facility]   Report Status PENDING   Incomplete  CULTURE, BLOOD (ROUTINE X 2)     Status: None   Collection Time    09/24/13 10:10 PM      Result Value Range Status   Specimen Description BLOOD LEFT ARM   Final   Special Requests BOTTLES DRAWN AEROBIC ONLY 10CC   Final   Culture  Setup Time     Final   Value: 09/25/2013 08:54     Performed at [facility]   Culture     Final   Value:        BLOOD CULTURE RECEIVED NO GROWTH TO DATE CULTURE WILL BE HELD FOR 5 DAYS BEFORE ISSUING A FINAL NEGATIVE REPORT     Performed at [facility]   Report Status  PENDING   Incomplete  URINE CULTURE     Status: None   Collection Time    09/24/13 10:27 PM      Result Value Range Status   Specimen Description URINE,  CATHETERIZED   Final   Special Requests NONE   Final   Culture  Setup Time     Final   Value: 09/24/2013 23:22     Performed at [facility] Count     Final   Value: NO GROWTH     Performed at [facility]   Culture     Final   Value: NO GROWTH     Performed at [facility]   Report Status 09/25/2013 FINAL   Final  MRSA PCR SCREENING     Status: None   Collection Time    09/25/13  1:30 AM      Result Value Range Status   MRSA by PCR NEGATIVE  NEGATIVE Final   Comment:            The GeneXpert MRSA Assay (FDA     approved for NASAL specimens     only), is one component of a     comprehensive MRSA colonization     surveillance program. It is not     intended to diagnose MRSA     infection nor to guide or     monitor treatment for     MRSA infections.     Studies:  Recent x-ray studies have been reviewed in detail by the Attending Physician     Junious Silk, ANP [facility] Office  [phone] Pager [phone]  **If unable to reach the above provider after paging please contact the Flow Manager @ [phone]  On-Call/Text Page:      [email or web address]      password [email or web address]  If 7PM-7AM, please contact night-coverage [email or web address] Password [email or web address] 09/26/2013, 2:16 PM   LOS: 2 days    I have examined the patient, reviewed the chart and modified the above note which I agree with.   Lariyah Shetterly,MD [phone] 09/26/2013, 4:52 PM

## 2013-09-26 NOTE — Progress Notes (Signed)
ANTIBIOTIC CONSULT NOTE - FOLLOW UP  Pharmacy Consult for Levaquin Indication: rule out sepsis  Allergies  Allergen Reactions  . Doxycycline Anaphylaxis  . Aspirin Nausea And Vomiting  . Penicillins Rash    Patient Measurements: Height: 5' (152.4 cm) Weight: 142 lb 6.7 oz (64.6 kg) IBW/kg (Calculated) : 45.5 Adjusted Body Weight:   Vital Signs: Temp: 98.8 F (37.1 C) (11/11 1624) Temp src: Oral (11/11 1624) BP: 147/46 mmHg (11/11 1624) Pulse Rate: 76 (11/11 1624) Intake/Output from previous day: 11/10 0701 - 11/11 0700 In: 4634.6 [P.O.:620; I.V.:3564.6; IV Piggyback:450] Out: 2075 [Urine:2075] Intake/Output from this shift: Total I/O In: 100 [P.O.:100] Out: 900 [Urine:900]  Labs:  Recent Labs  09/24/13 2216 09/24/13 2234 09/25/13 1300 09/26/13 0500  WBC 3.4*  --  3.9* 4.1  HGB 15.4* 15.6* 12.0 12.4  PLT 145*  --  120* 120*  CREATININE  --  1.10 0.69 0.54   Estimated Creatinine Clearance: 38.4 ml/min (by C-G formula based on Cr of 0.54). No results found for this basename: VANCOTROUGH, VANCOPEAK, VANCORANDOM, GENTTROUGH, GENTPEAK, GENTRANDOM, TOBRATROUGH, TOBRAPEAK, TOBRARND, AMIKACINPEAK, AMIKACINTROU, AMIKACIN,  in the last 72 hours   Microbiology: Recent Results (from the past 720 hour(s))  CULTURE, BLOOD (ROUTINE X 2)     Status: None   Collection Time    09/24/13 10:00 PM      Result Value Range Status   Specimen Description BLOOD LEFT ARM   Final   Special Requests BOTTLES DRAWN AEROBIC ONLY 10CC   Final   Culture  Setup Time     Final   Value: 09/25/2013 08:54     Performed at [facility]   Culture     Final   Value:        BLOOD CULTURE RECEIVED NO GROWTH TO DATE CULTURE WILL BE HELD FOR 5 DAYS BEFORE ISSUING A FINAL NEGATIVE REPORT     Performed at [facility]   Report Status PENDING   Incomplete  CULTURE, BLOOD (ROUTINE X 2)     Status: None   Collection Time    09/24/13 10:10 PM      Result Value Range Status   Specimen  Description BLOOD LEFT ARM   Final   Special Requests BOTTLES DRAWN AEROBIC ONLY 10CC   Final   Culture  Setup Time     Final   Value: 09/25/2013 08:54     Performed at [facility]   Culture     Final   Value:        BLOOD CULTURE RECEIVED NO GROWTH TO DATE CULTURE WILL BE HELD FOR 5 DAYS BEFORE ISSUING A FINAL NEGATIVE REPORT     Performed at [facility]   Report Status PENDING   Incomplete  URINE CULTURE     Status: None   Collection Time    09/24/13 10:27 PM      Result Value Range Status   Specimen Description URINE, CATHETERIZED   Final   Special Requests NONE   Final   Culture  Setup Time     Final   Value: 09/24/2013 23:22     Performed at [facility] Count     Final   Value: NO GROWTH     Performed at [facility]   Culture     Final   Value: NO GROWTH     Performed at [facility]   Report Status 09/25/2013 FINAL   Final  MRSA PCR  SCREENING     Status: None   Collection Time    09/25/13  1:30 AM      Result Value Range Status   MRSA by PCR NEGATIVE  NEGATIVE Final   Comment:            The GeneXpert MRSA Assay (FDA     approved for NASAL specimens     only), is one component of a     comprehensive MRSA colonization     surveillance program. It is not     intended to diagnose MRSA     infection nor to guide or     monitor treatment for     MRSA infections.    Anti-infectives   Start     Dose/Rate Route Frequency Ordered Stop   09/26/13 0000  vancomycin (VANCOCIN) IVPB 750 mg/150 ml premix  Status:  Discontinued     750 mg 150 mL/hr over 60 Minutes Intravenous Every 24 hours 09/25/13 0118 09/26/13 1432   09/25/13 2200  cefTRIAXone (ROCEPHIN) 2 g in dextrose 5 % 50 mL IVPB  Status:  Discontinued     2 g 100 mL/hr over 30 Minutes Intravenous Every 24 hours 09/24/13 2147 09/24/13 2219   09/25/13 2200  cefTRIAXone (ROCEPHIN) 1 g in dextrose 5 % 50 mL IVPB  Status:  Discontinued     1 g 100 mL/hr over 30 Minutes  Intravenous Every 24 hours 09/24/13 2219 09/25/13 0118   09/25/13 0130  vancomycin (VANCOCIN) IVPB 750 mg/150 ml premix     750 mg 150 mL/hr over 60 Minutes Intravenous  Once 09/25/13 0118 09/25/13 0243   09/25/13 0130  imipenem-cilastatin (PRIMAXIN) 250 mg in sodium chloride 0.9 % 100 mL IVPB  Status:  Discontinued     250 mg 200 mL/hr over 30 Minutes Intravenous 3 times per day 09/25/13 0119 09/26/13 1432   09/24/13 2200  cefTRIAXone (ROCEPHIN) 2 g in dextrose 5 % 50 mL IVPB     2 g 100 mL/hr over 30 Minutes Intravenous  Once 09/24/13 2145 09/24/13 2233      Assessment: 89yof to have antibiotics narrowed from Vancomycin and Primaxin to Levaquin r/o SIRS (unknown source of infection). Patient remains afebrile, WBC 4.1, cultures NGTD. - CrCl 38 ml/min  Plan:  1. Levaquin 750mg  IV q48h 2. Follow-up renal function, cultures and antibiotic plan  Cleon Dew [phone] 09/26/2013,5:48 PM

## 2013-09-27 ENCOUNTER — Inpatient Hospital Stay (HOSPITAL_COMMUNITY): Payer: Medicare Other

## 2013-09-27 DIAGNOSIS — I10 Essential (primary) hypertension: Secondary | ICD-10-CM

## 2013-09-27 DIAGNOSIS — K56609 Unspecified intestinal obstruction, unspecified as to partial versus complete obstruction: Secondary | ICD-10-CM

## 2013-09-27 LAB — CBC
MCH: 29.1 pg (ref 26.0–34.0)
MCHC: 33.1 g/dL (ref 30.0–36.0)
MCV: 88.1 fL (ref 78.0–100.0)
Platelets: 221 10*3/uL (ref 150–400)
RBC: 2.68 MIL/uL — ABNORMAL LOW (ref 3.87–5.11)
RDW: 16.3 % — ABNORMAL HIGH (ref 11.5–15.5)
WBC: 8.7 10*3/uL (ref 4.0–10.5)

## 2013-09-27 LAB — PREPARE RBC (CROSSMATCH)

## 2013-09-27 LAB — COMPREHENSIVE METABOLIC PANEL
ALT: 20 U/L (ref 0–35)
AST: 37 U/L (ref 0–37)
Albumin: 2.9 g/dL — ABNORMAL LOW (ref 3.5–5.2)
BUN: 12 mg/dL (ref 6–23)
CO2: 22 mEq/L (ref 19–32)
Calcium: 8.1 mg/dL — ABNORMAL LOW (ref 8.4–10.5)
Creatinine, Ser: 0.81 mg/dL (ref 0.50–1.10)
Sodium: 144 mEq/L (ref 135–145)
Total Bilirubin: 0.5 mg/dL (ref 0.3–1.2)
Total Protein: 5.9 g/dL — ABNORMAL LOW (ref 6.0–8.3)

## 2013-09-27 LAB — FERRITIN: Ferritin: 527 ng/mL — ABNORMAL HIGH (ref 10–291)

## 2013-09-27 LAB — RETICULOCYTES: Retic Count, Absolute: 36 10*3/uL (ref 19.0–186.0)

## 2013-09-27 LAB — GLUCOSE, CAPILLARY: Glucose-Capillary: 139 mg/dL — ABNORMAL HIGH (ref 70–99)

## 2013-09-27 LAB — FOLATE: Folate: 19.7 ng/mL

## 2013-09-27 LAB — VITAMIN B12: Vitamin B-12: 938 pg/mL — ABNORMAL HIGH (ref 211–911)

## 2013-09-27 LAB — LACTIC ACID, PLASMA: Lactic Acid, Venous: 1.3 mmol/L (ref 0.5–2.2)

## 2013-09-27 MED ORDER — DEXTROSE-NACL 5-0.45 % IV SOLN
INTRAVENOUS | Status: DC
  Administered 2013-09-27: 1000 mL via INTRAVENOUS

## 2013-09-27 MED ORDER — IOHEXOL 300 MG/ML  SOLN
80.0000 mL | Freq: Once | INTRAMUSCULAR | Status: AC | PRN
  Administered 2013-09-27: 80 mL via INTRAVENOUS

## 2013-09-27 MED ORDER — WHITE PETROLATUM GEL
Status: AC
  Administered 2013-09-27: 0.2
  Filled 2013-09-27: qty 5

## 2013-09-27 MED ORDER — IOHEXOL 300 MG/ML  SOLN
25.0000 mL | INTRAMUSCULAR | Status: AC
  Administered 2013-09-27 (×2): 25 mL via ORAL

## 2013-09-27 NOTE — Consult Note (Signed)
Reason for Consult:SBO Referring Physician: Dr Minus Liberty [name] Colon is an 79 y.o. female.  HPI: 2 day history of weakness,  Abdominal pain.  Recently treated for UTI. CT shows SBO.  No flatus or BM.  No vomiting.  Pain is crampy and diffuse.  Past Medical History  Diagnosis Date  . Hypertension   . Diabetes mellitus   . Thyroid disease   . Claudication     Past Surgical History  Procedure Laterality Date  . Appendectomy    . Tonsillectomy      Family History  Problem Relation Age of Onset  . Heart failure Mother   . Lung disease Father     Black Lung disease    Social History:  reports that she has never smoked. She does not have any smokeless tobacco history on file. She reports that she does not drink alcohol or use illicit drugs.  Allergies:  Allergies  Allergen Reactions  . Doxycycline Anaphylaxis  . Aspirin Nausea And Vomiting  . Penicillins Rash    Medications: I have reviewed the patient's current medications.  Results for orders placed during the hospital encounter of 09/24/13 (from the past 48 hour(s))  GLUCOSE, CAPILLARY     Status: Abnormal   Collection Time    09/25/13  8:52 PM      Result Value Range   Glucose-Capillary 147 (*) 70 - 99 mg/dL   Comment 1 Notify RN     Comment 2 Documented in Chart    CBC WITH DIFFERENTIAL     Status: Abnormal   Collection Time    09/26/13  5:00 AM      Result Value Range   WBC 4.1  4.0 - 10.5 K/uL   RBC 3.91  3.87 - 5.11 MIL/uL   Hemoglobin 12.4  12.0 - 15.0 g/dL   HCT 40.9 (*) 81.1 - 91.4 %   MCV 89.8  78.0 - 100.0 fL   MCH 31.7  26.0 - 34.0 pg   MCHC 35.3  30.0 - 36.0 g/dL   RDW 78.2  95.6 - 21.3 %   Platelets 120 (*) 150 - 400 K/uL   Neutrophils Relative % 41 (*) 43 - 77 %   Neutro Abs 1.7  1.7 - 7.7 K/uL   Lymphocytes Relative 36  12 - 46 %   Lymphs Abs 1.5  0.7 - 4.0 K/uL   Monocytes Relative 10  3 - 12 %   Monocytes Absolute 0.4  0.1 - 1.0 K/uL   Eosinophils Relative 14 (*) 0 - 5 %   Eosinophils  Absolute 0.6  0.0 - 0.7 K/uL   Basophils Relative 0  0 - 1 %   Basophils Absolute 0.0  0.0 - 0.1 K/uL  BASIC METABOLIC PANEL     Status: Abnormal   Collection Time    09/26/13  5:00 AM      Result Value Range   Sodium 132 (*) 135 - 145 mEq/L   Potassium 3.6  3.5 - 5.1 mEq/L   Chloride 103  96 - 112 mEq/L   CO2 20  19 - 32 mEq/L   Glucose, Bld 128 (*) 70 - 99 mg/dL   BUN 6  6 - 23 mg/dL   Creatinine, Ser 0.86  0.50 - 1.10 mg/dL   Calcium 7.4 (*) 8.4 - 10.5 mg/dL   GFR calc non Af Amer 80 (*) >90 mL/min   GFR calc Af Amer >90  >90 mL/min   Comment: (NOTE)  The eGFR has been calculated using the CKD EPI equation.     This calculation has not been validated in all clinical situations.     eGFR's persistently <90 mL/min signify possible Chronic Kidney     Disease.  GLUCOSE, CAPILLARY     Status: Abnormal   Collection Time    09/26/13  8:11 AM      Result Value Range   Glucose-Capillary 127 (*) 70 - 99 mg/dL  GLUCOSE, CAPILLARY     Status: Abnormal   Collection Time    09/26/13 12:10 PM      Result Value Range   Glucose-Capillary 147 (*) 70 - 99 mg/dL  PROCALCITONIN     Status: None   Collection Time    09/26/13  4:10 PM      Result Value Range   Procalcitonin 0.12     Comment:            Interpretation:     PCT (Procalcitonin) <= 0.5 ng/mL:     Systemic infection (sepsis) is not likely.     Local bacterial infection is possible.     (NOTE)             ICU PCT Algorithm               Non ICU PCT Algorithm        ----------------------------     ------------------------------             PCT < 0.25 ng/mL                 PCT < 0.1 ng/mL         Stopping of antibiotics            Stopping of antibiotics           strongly encouraged.               strongly encouraged.        ----------------------------     ------------------------------           PCT level decrease by               PCT < 0.25 ng/mL           >= 80% from peak PCT           OR PCT 0.25 - 0.5 ng/mL           Stopping of antibiotics                                                 encouraged.         Stopping of antibiotics               encouraged.        ----------------------------     ------------------------------           PCT level decrease by              PCT >= 0.25 ng/mL           < 80% from peak PCT            AND PCT >= 0.5 ng/mL            Continuing antibiotics  encouraged.           Continuing antibiotics                encouraged.        ----------------------------     ------------------------------         PCT level increase compared          PCT > 0.5 ng/mL             with peak PCT AND              PCT >= 0.5 ng/mL             Escalation of antibiotics                                              strongly encouraged.          Escalation of antibiotics            strongly encouraged.  GLUCOSE, CAPILLARY     Status: Abnormal   Collection Time    09/26/13  5:05 PM      Result Value Range   Glucose-Capillary 154 (*) 70 - 99 mg/dL  GLUCOSE, CAPILLARY     Status: Abnormal   Collection Time    09/26/13  8:25 PM      Result Value Range   Glucose-Capillary 137 (*) 70 - 99 mg/dL  CBC     Status: Abnormal   Collection Time    09/27/13  4:50 AM      Result Value Range   WBC 8.7  4.0 - 10.5 K/uL   RBC 2.68 (*) 3.87 - 5.11 MIL/uL   Hemoglobin 7.8 (*) 12.0 - 15.0 g/dL   Comment: DELTA CHECK NOTED     REPEATED TO VERIFY   HCT 23.6 (*) 36.0 - 46.0 %   MCV 88.1  78.0 - 100.0 fL   MCH 29.1  26.0 - 34.0 pg   MCHC 33.1  30.0 - 36.0 g/dL   RDW 40.9 (*) 81.1 - 91.4 %   Platelets 221  150 - 400 K/uL   Comment: DELTA CHECK NOTED     REPEATED TO VERIFY     SPECIMEN CHECKED FOR CLOTS  COMPREHENSIVE METABOLIC PANEL     Status: Abnormal   Collection Time    09/27/13  4:50 AM      Result Value Range   Sodium 144  135 - 145 mEq/L   Comment: DELTA CHECK NOTED   Potassium 4.4  3.5 - 5.1 mEq/L   Comment: DELTA CHECK NOTED   Chloride  113 (*) 96 - 112 mEq/L   Comment: DELTA CHECK NOTED   CO2 22  19 - 32 mEq/L   Glucose, Bld 164 (*) 70 - 99 mg/dL   BUN 12  6 - 23 mg/dL   Creatinine, Ser 7.82  0.50 - 1.10 mg/dL   Calcium 8.1 (*) 8.4 - 10.5 mg/dL   Total Protein 5.9 (*) 6.0 - 8.3 g/dL   Albumin 2.9 (*) 3.5 - 5.2 g/dL   AST 37  0 - 37 U/L   ALT 20  0 - 35 U/L   Alkaline Phosphatase 50  39 - 117 U/L   Total Bilirubin 0.5  0.3 - 1.2 mg/dL   GFR calc non Af Amer 62 (*) >90 mL/min   GFR calc Af Amer 71 (*) >  90 mL/min   Comment: (NOTE)     The eGFR has been calculated using the CKD EPI equation.     This calculation has not been validated in all clinical situations.     eGFR's persistently <90 mL/min signify possible Chronic Kidney     Disease.  VITAMIN B12     Status: Abnormal   Collection Time    09/27/13  8:00 AM      Result Value Range   Vitamin B-12 938 (*) 211 - 911 pg/mL   Comment: Performed at [facility]  FOLATE     Status: None   Collection Time    09/27/13  8:00 AM      Result Value Range   Folate 19.7     Comment: (NOTE)     Reference Ranges            Deficient:       0.4 - 3.3 ng/mL            Indeterminate:   3.4 - 5.4 ng/mL            Normal:              > 5.4 ng/mL     Performed at [facility]  FERRITIN     Status: Abnormal   Collection Time    09/27/13  8:00 AM      Result Value Range   Ferritin 527 (*) 10 - 291 ng/mL   Comment: Performed at [facility]  RETICULOCYTES     Status: None   Collection Time    09/27/13  8:00 AM      Result Value Range   Retic Ct Pct 0.9  0.4 - 3.1 %   RBC. 4.00  3.87 - 5.11 MIL/uL   Retic Count, Manual 36.0  19.0 - 186.0 K/uL  GLUCOSE, CAPILLARY     Status: Abnormal   Collection Time    09/27/13  8:08 AM      Result Value Range   Glucose-Capillary 145 (*) 70 - 99 mg/dL  HEMOGLOBIN AND HEMATOCRIT, BLOOD     Status: None   Collection Time    09/27/13  8:51 AM      Result Value Range   Hemoglobin 12.8  12.0 - 15.0 g/dL    Comment: REPEATED TO VERIFY   HCT 36.0  36.0 - 46.0 %  TYPE AND SCREEN     Status: None   Collection Time    09/27/13  9:55 AM      Result Value Range   ABO/RH(D) B NEG     Antibody Screen NEG     Sample Expiration 09/30/2013     Unit Number [id number]     Blood Component Type RED CELLS,LR     Unit division 00     Status of Unit ALLOCATED     Transfusion Status OK TO TRANSFUSE     Crossmatch Result Compatible    PREPARE RBC (CROSSMATCH)     Status: None   Collection Time    09/27/13  9:55 AM      Result Value Range   Order Confirmation ORDER PROCESSED BY BLOOD BANK    ABO/RH     Status: None   Collection Time    09/27/13  9:55 AM      Result Value Range   ABO/RH(D) B NEG    GLUCOSE, CAPILLARY     Status: Abnormal   Collection Time    09/27/13 12:30  PM      Result Value Range   Glucose-Capillary 118 (*) 70 - 99 mg/dL  LACTIC ACID, PLASMA     Status: None   Collection Time    09/27/13  2:15 PM      Result Value Range   Lactic Acid, Venous 1.3  0.5 - 2.2 mmol/L  GLUCOSE, CAPILLARY     Status: Abnormal   Collection Time    09/27/13  5:02 PM      Result Value Range   Glucose-Capillary 139 (*) 70 - 99 mg/dL    Ct Abdomen Pelvis W Contrast  09/27/2013   CLINICAL DATA:  Left lower quadrant abdominal pain.  EXAM: CT ABDOMEN AND PELVIS WITH CONTRAST  TECHNIQUE: Multidetector CT imaging of the abdomen and pelvis was performed using the standard protocol following bolus administration of intravenous contrast.  CONTRAST:  80mL OMNIPAQUE IOHEXOL 300 MG/ML  SOLN  COMPARISON:  03/28/2008.  FINDINGS: The lung bases demonstrate small bilateral pleural effusions and overlying atelectasis. The heart is normal in size. No pericardial effusion. The esophagus is grossly normal.  The liver is unremarkable. No focal hepatic lesions or intrahepatic biliary dilatation. The gallbladder is mildly distended has a prominent gallbladder fold. The common bile duct is within normal limits in caliber.  The pancreas appears normal. The spleen is normal in size. Small granulomas are noted. The adrenal glands and kidneys are unremarkable.  The stomach and duodenum are unremarkable. The proximal loops of small bowel are distended with air-fluid levels consistent with obstruction. There is a focal transition point in the mid small bowel located in the mid central pelvis best seen on axial image number 57. The small bowel beyond this point is decompressed/ normal in caliber. I do not see a definite mass. This is likely a tight adhesion. The colon is relatively decompressed. Moderate diverticulosis without findings for acute diverticulitis. There is moderate fluid around the liver and spleen, in the pericolic gutters and in the pelvis. This is likely due to the small bowel obstruction.  No mesenteric or retroperitoneal mass or adenopathy. Stable calcified mesenteric lymph nodes are noted in the right upper pelvis. No free air is identified. Advanced atherosclerotic calcifications involving the aorta and branch vessels but no dissection.  There is a Foley catheter in the bladder. The uterus demonstrates calcified fibroids. The ovaries are grossly normal. No pelvic mass or adenopathy. No inguinal mass or adenopathy. Small left inguinal hernia containing fat is noted.  The bony structures are unremarkable.  IMPRESSION: 1. Small bowel obstruction with a transition point noted in the mid upper central pelvis best seen on axial image number 57. This is likely due to a tight adhesion. 2. Decompressed colon with moderate diverticulosis. 3. Small to moderate amount of free abdominal/pelvic fluid. 4. Distended gallbladder with prominent fold. 5. Advanced atherosclerotic calcifications involving the aorta and branch vessels. 6. Small bilateral pleural effusions and overlying atelectasis.   Electronically Signed   By: Loralie Champagne M.D.   On: 09/27/2013 16:26   Dg Chest Port 1 View  09/27/2013   CLINICAL DATA:  Question source  of systemic inflammatory response syndrome  EXAM: PORTABLE CHEST - 1 VIEW  COMPARISON:  09/24/2013  FINDINGS: Right arm PICC has been placed with the tip in the right atrium.  Progression of bibasilar airspace disease which is mild and may represent atelectasis versus pneumonia. Negative for heart failure or effusion. Underlying chronic lung disease is present.  IMPRESSION: PICC tip in the mid right atrium  Progression of bibasilar atelectasis/ infiltrate.   Electronically Signed   By: Marlan Palau M.D.   On: 09/27/2013 08:37   Dg Abd Portable 1v  09/27/2013   CLINICAL DATA:  Question obstipation  EXAM: PORTABLE ABDOMEN - 1 VIEW  COMPARISON:  CT 03/28/2008  FINDINGS: Normal bowel gas pattern without bowel obstruction. No significant stool in the colon. Negative for ileus.  Motion degraded imaged.  IMPRESSION: No acute abnormality.   Electronically Signed   By: Marlan Palau M.D.   On: 09/27/2013 08:38    Review of Systems  HENT: Negative.   Eyes: Negative.   Respiratory: Negative.   Cardiovascular: Negative.   Gastrointestinal: Positive for abdominal pain.  Genitourinary: Negative.   Musculoskeletal: Negative.   Skin: Negative.   Neurological: Positive for weakness.  Endo/Heme/Allergies: Negative.   Psychiatric/Behavioral: Negative.    Blood pressure 173/55, pulse 77, temperature 98.6 F (37 C), temperature source Oral, resp. rate 35, height 5' (1.524 m), weight 142 lb 6.7 oz (64.6 kg), SpO2 96.00%. Physical Exam  Constitutional: She is oriented to person, place, and time. She appears well-developed and well-nourished.  HENT:  Head: Normocephalic and atraumatic.  Eyes: Pupils are equal, round, and reactive to light. No scleral icterus.  Neck: Normal range of motion.  GI: Soft. She exhibits distension. There is tenderness. There is no rebound and no guarding.  Musculoskeletal: Normal range of motion.  Neurological: She is alert and oriented to person, place, and time.  Skin: Skin is  warm and dry.  Psychiatric: She has a normal mood and affect. Her behavior is normal. Judgment and thought content normal.    Assessment/Plan: SBO IVF/NGT/NPO/CBC/CMET Films in AM.   Spike Desilets A. 09/27/2013, 5:50 PM

## 2013-09-27 NOTE — Progress Notes (Signed)
Pt has been complaining of itching and having been having diarrhea, PA on call was notified orders given. Will continue to monitor pt.-------Paddy Walthall, rn

## 2013-09-27 NOTE — Progress Notes (Addendum)
[facility] Progress Note [place] TEAM 1 - Stepdown ICU Team   [name] Colon:[id number] DOB: [birth year] DOA: 09/24/2013 PCP: Willow Ora, MD  Brief narrative: 79 y.o. female was brought to the ER because of increased weakness and was found to be febrile. Patient had recently been treated in the past week by her PCP for possible UTI and was placed on Bactrim. On the following day she became very weak and had not been walking well since then. Patient did not have any nausea, vomiting, abdominal pain, diarrhea, chest pain, shortness of breath or productive cough. Since that time she had become extremely weak and was unable to ambulate. The patient was brought to the ER and found to be febrile with leukopenia. The patient was thought to be septic so the critical care service was consulted but felt pt stable so recommended hospitalist admission. Apparently en route to the hospital the patient had possible V tach for which EMS had given one dose of amiodarone 150 mg IV. EKG in the ER demonstrated normal sinus rhythm with QTC of 418. Troponins were negative and patient denied any chest pain or shortness of breath. Patient stated that over the last 4-5 days patient's chronic tremors had worsened.   Assessment/Plan:    SIRS (systemic inflammatory response syndrome) -barely met criteria -infectious source not clear but ? UTI (recent OP tx w/ Bactrim) - could also be viral -provide supportive care -urine culture with no growth -blood cxs NGTD -PCT 0.12 so if no clear cut source of infection should stop anbx's   Abdominal pain -focally on left side so concern for tics -had apparent diarrhea pre admit and C Diff still not collected- also had broad spectrum anbx's during admit so increased risk for C diff so need to ensure PCR collected -also check GI pathogen panel -plain films unrevealing - check CT abd/pelvis with contrast re: tics vs colitis vs ischemia -repeat lactic acid with new  sx's -if CT revealing for enteric process may need to change Levaquin to Cipro/Flagyl for best GN coverage   Suspected drug reaction -pt with flushing, redness and itching 11/11 -has PCN allergy so Primaxin was dc'd as a precaution- also considered red man syndrome from Vanco so was dc'd  too -1x doses of Pepcid and Benadryl- if sx's persist will need IV Solumedrol-as of 11/12 seems better   Anemia -hgb with dramatic drop today-nearly 5 gms- will repeat H/H to confirm - Repeat H&H checked, hemoglobin 12.8, hematocrit 36.0, canceled transfusion order    Leukopenia -suspect due to infection -improved    Dehydration with hyponatremia -resolved -baseline Hgb 12 -KVO  IVF and follow lytes    Type 2 diabetes mellitus -controlled -cont SSI -HOLD Metformin    Thrombocytopenia -chronic and stable    ?? Wide-complex tachycardia -could not find any rhythm strips to confirm -FU on ECHO -kepp K > 4.0 as a precaution    Hypothyroidism -Synthroid    Poor venous access -family refused lab sticks this am -likely compounded by [facility] -place PICC for fluids and labs   DVT prophylaxis:  SCDs Code Status: Full Family Communication: No family available during rounding time Disposition Plan/Expected LOS: Stepdown  Consultants: None  Procedures: 2-D echocardiogram  Antibiotics: Rocephin 11/9 >>> 11/9 Primaxin 11/9 >>> 11/11 Vancomycin 11/9 >>> 11/11 Levaquin 11/11 >>>  HPI/Subjective: Patient awake and now endorsing abdominal pain- no SOB or CP reported  Objective: Blood pressure 154/61, pulse 75, temperature 98.4 F (36.9 C), temperature source Oral, resp.  rate 24, height 5' (1.524 m), weight 142 lb 6.7 oz (64.6 kg), SpO2 93.00%.  Intake/Output Summary (Last 24 hours) at 09/27/13 1038 Last data filed at 09/27/13 0810  Gross per 24 hour  Intake 1320.83 ml  Output   1675 ml  Net -354.17 ml   Exam: General: No acute respiratory distress Lungs: Clear to auscultation  bilaterally without wheezes or crackles, RA Cardiovascular: Regular rate and rhythm without murmur gallop or rub normal S1 and S2, no peripheral edema or JVD Abdomen: Very tender left side esp LLQ, distended and tympanitic, soft, hyperactive bowel sounds positive, no rebound, no ascites, no appreciable mass Musculoskeletal: No significant cyanosis, clubbing of bilateral lower extremities Neurological: Alert and oriented x name and ?? place, moves all extremities x 4 without focal neurological deficits, CN 2-12 intact  Scheduled Meds:  Scheduled Meds: . calcium-vitamin D  1 tablet Oral BID WC  . insulin aspart  0-9 Units Subcutaneous TID WC  . iohexol  25 mL Oral Q1 Hr x 2  . levofloxacin (LEVAQUIN) IV  750 mg Intravenous Q48H  . levothyroxine  75 mcg Oral QAC breakfast  . multivitamin-lutein  1 capsule Oral Daily  . omega-3 acid ethyl esters  1 g Oral Daily  . primidone  125 mg Oral QHS  . sodium chloride  10-40 mL Intracatheter Q12H  . sodium chloride  3 mL Intravenous Q12H   Data Reviewed: Basic Metabolic Panel:  Recent Labs Lab 09/24/13 2216 09/24/13 2234 09/25/13 1300 09/26/13 0500 09/27/13 0450  NA  --  132* 132* 132* 144  K  --  4.0 3.8 3.6 4.4  CL  --  98 105 103 113*  CO2  --   --  16* 20 22  GLUCOSE  --  109* 105* 128* 164*  BUN  --  11 10 6 12   CREATININE  --  1.10 0.69 0.54 0.81  CALCIUM  --   --  7.2* 7.4* 8.1*  MG 1.9  --   --   --   --    Liver Function Tests:  Recent Labs Lab 09/25/13 1300 09/27/13 0450  AST 39* 37  ALT 30 20  ALKPHOS 36* 50  BILITOT 0.4 0.5  PROT 5.2* 5.9*  ALBUMIN 2.3* 2.9*   No results found for this basename: LIPASE, AMYLASE,  in the last 168 hours No results found for this basename: AMMONIA,  in the last 168 hours CBC:  Recent Labs Lab 09/24/13 2216 09/24/13 2234 09/25/13 1300 09/26/13 0500 09/27/13 0450  WBC 3.4*  --  3.9* 4.1 8.7  NEUTROABS 1.3*  --   --  1.7  --   HGB 15.4* 15.6* 12.0 12.4 7.8*  HCT 42.7 46.0  33.6* 35.1* 23.6*  MCV 90.1  --  89.8 89.8 88.1  PLT 145*  --  120* 120* 221   CBG:  Recent Labs Lab 09/26/13 0811 09/26/13 1210 09/26/13 1705 09/26/13 2025 09/27/13 0808  GLUCAP 127* 147* 154* 137* 145*    Recent Results (from the past 240 hour(s))  CULTURE, BLOOD (ROUTINE X 2)     Status: None   Collection Time    09/24/13 10:00 PM      Result Value Range Status   Specimen Description BLOOD LEFT ARM   Final   Special Requests BOTTLES DRAWN AEROBIC ONLY 10CC   Final   Culture  Setup Time     Final   Value: 09/25/2013 08:54     Performed at [facility]  Culture     Final   Value:        BLOOD CULTURE RECEIVED NO GROWTH TO DATE CULTURE WILL BE HELD FOR 5 DAYS BEFORE ISSUING A FINAL NEGATIVE REPORT     Performed at [facility]   Report Status PENDING   Incomplete  CULTURE, BLOOD (ROUTINE X 2)     Status: None   Collection Time    09/24/13 10:10 PM      Result Value Range Status   Specimen Description BLOOD LEFT ARM   Final   Special Requests BOTTLES DRAWN AEROBIC ONLY 10CC   Final   Culture  Setup Time     Final   Value: 09/25/2013 08:54     Performed at [facility]   Culture     Final   Value:        BLOOD CULTURE RECEIVED NO GROWTH TO DATE CULTURE WILL BE HELD FOR 5 DAYS BEFORE ISSUING A FINAL NEGATIVE REPORT     Performed at [facility]   Report Status PENDING   Incomplete  URINE CULTURE     Status: None   Collection Time    09/24/13 10:27 PM      Result Value Range Status   Specimen Description URINE, CATHETERIZED   Final   Special Requests NONE   Final   Culture  Setup Time     Final   Value: 09/24/2013 23:22     Performed at [facility] Count     Final   Value: NO GROWTH     Performed at [facility]   Culture     Final   Value: NO GROWTH     Performed at [facility]   Report Status 09/25/2013 FINAL   Final  MRSA PCR SCREENING     Status: None   Collection Time    09/25/13  1:30 AM       Result Value Range Status   MRSA by PCR NEGATIVE  NEGATIVE Final   Comment:            The GeneXpert MRSA Assay (FDA     approved for NASAL specimens     only), is one component of a     comprehensive MRSA colonization     surveillance program. It is not     intended to diagnose MRSA     infection nor to guide or     monitor treatment for     MRSA infections.     Studies:  Recent x-ray studies have been reviewed in detail by the Attending Physician     Junious Silk, ANP [facility] Office  [phone] Pager [phone]  **If unable to reach the above provider after paging please contact the Flow Manager @ [phone]  On-Call/Text Page:      [email or web address]      password [email or web address]  If 7PM-7AM, please contact night-coverage [email or web address] Password [email or web address] 09/27/2013, 10:38 AM   LOS: 3 days    I have personally examined the patient and reviewed the entire database. Agree with the above note and plan as outlined, any necessary changes made. Will follow CT abdomen results, rule out any ischemic colitis, mesenteric ischemia, diverticulitis. Follow cdiff PCR, GI pathogen PCR, lactic acid.  Justin Buechner M.D. [facility] 09/27/2013, 1:23 PM Pager: [phone]  If 7PM-7AM, please contact night-coverage [email or web address] Password [email or web address]

## 2013-09-27 NOTE — Plan of Care (Addendum)
CT abdomen and pelvis reviewed  IMPRESSION:  1. Small bowel obstruction with a transition point noted in the mid  upper central pelvis best seen on axial image number 57. This is  likely due to a tight adhesion.  2. Decompressed colon with moderate diverticulosis.  3. Small to moderate amount of free abdominal/pelvic fluid.  4. Distended gallbladder with prominent fold.  5. Advanced atherosclerotic calcifications involving the aorta and  branch vessels.  6. Small bilateral pleural effusions and overlying atelectasis.  - Made her n.p.o - General surgery consult called   Micaiah Remillard M.D. [facility] 09/27/2013, 4:37 PM  Pager: [phone]    - Talked to the on-call surgeon, recommended NG tube to wall suction  - Discussed in detail with patient's daughter and RN    Jhalil Silvera M.D. [facility] 09/27/2013, 5:29 PM  Pager: [phone]

## 2013-09-27 NOTE — Progress Notes (Signed)
Keep npo pending CT scan per Dr. Isidoro Donning.

## 2013-09-28 ENCOUNTER — Inpatient Hospital Stay (HOSPITAL_COMMUNITY): Payer: Medicare Other

## 2013-09-28 DIAGNOSIS — K56609 Unspecified intestinal obstruction, unspecified as to partial versus complete obstruction: Principal | ICD-10-CM

## 2013-09-28 LAB — GLUCOSE, CAPILLARY
Glucose-Capillary: 140 mg/dL — ABNORMAL HIGH (ref 70–99)
Glucose-Capillary: 121 mg/dL — ABNORMAL HIGH (ref 70–99)
Glucose-Capillary: 121 mg/dL — ABNORMAL HIGH (ref 70–99)

## 2013-09-28 LAB — IRON AND TIBC
Iron: 33 ug/dL — ABNORMAL LOW (ref 42–135)
Saturation Ratios: 21 % (ref 20–55)
TIBC: 156 ug/dL — ABNORMAL LOW (ref 250–470)
UIBC: 123 ug/dL — ABNORMAL LOW (ref 125–400)

## 2013-09-28 LAB — COMPREHENSIVE METABOLIC PANEL
ALT: 23 U/L (ref 0–35)
AST: 25 U/L (ref 0–37)
Albumin: 2.4 g/dL — ABNORMAL LOW (ref 3.5–5.2)
Alkaline Phosphatase: 45 U/L (ref 39–117)
Calcium: 8.3 mg/dL — ABNORMAL LOW (ref 8.4–10.5)
Creatinine, Ser: 0.59 mg/dL (ref 0.50–1.10)
GFR calc Af Amer: >90 mL/min (ref >90)
Glucose, Bld: 132 mg/dL — ABNORMAL HIGH (ref 70–99)
Potassium: 4 mEq/L (ref 3.5–5.1)
Sodium: 134 mEq/L — ABNORMAL LOW (ref 135–145)
Total Protein: 5.6 g/dL — ABNORMAL LOW (ref 6.0–8.3)

## 2013-09-28 LAB — TYPE AND SCREEN: Unit division: 0

## 2013-09-28 LAB — PROCALCITONIN: Procalcitonin: <0.1 ng/mL

## 2013-09-28 MED ORDER — LEVOTHYROXINE SODIUM 100 MCG IV SOLR
37.5000 ug | Freq: Every day | INTRAVENOUS | Status: DC
  Administered 2013-09-28 – 2013-09-29 (×2): 37.5 ug via INTRAVENOUS
  Filled 2013-09-28 (×2): qty 5

## 2013-09-28 MED ORDER — KCL IN DEXTROSE-NACL 40-5-0.9 MEQ/L-%-% IV SOLN
INTRAVENOUS | Status: DC
  Administered 2013-09-28: 1000 mL via INTRAVENOUS
  Filled 2013-09-28 (×2): qty 1000

## 2013-09-28 NOTE — Progress Notes (Signed)
[facility] Progress Note [place] TEAM 1 - Stepdown ICU Team   [name] Colon:[id number] DOB: 1919-11-24 DOA: 09/24/2013 PCP: Willow Ora, MD  Brief narrative: 79 y.o. female was brought to the ER because of increased weakness and was found to be febrile. Patient had recently been treated in the past week by her PCP for possible UTI and was placed on Bactrim. On the following day she became very weak and had not been walking well since then. Patient did not have any nausea, vomiting, abdominal pain, diarrhea, chest pain, shortness of breath or productive cough. Since that time she had become extremely weak and was unable to ambulate. The patient was brought to the ER and found to be febrile with leukopenia. The patient was thought to be septic so the critical care service was consulted but felt pt stable so recommended hospitalist admission. Apparently en route to the hospital the patient had possible V tach for which EMS had given one dose of amiodarone 150 mg IV. EKG in the ER demonstrated normal sinus rhythm with QTC of 418. Troponins were negative and patient denied any chest pain or shortness of breath. Patient stated that over the last 4-5 days patient's chronic tremors had worsened.   Assessment/Plan:    SIRS (systemic inflammatory response syndrome) -sx's related to low perfusion from [facility] in setting of likely bowel obstruction pre admission due to severe obstipation -urine culture with no growth -blood cxs NGTD -PCT 0.12 so if no clear cut source of infection should stop anbx's   Acute Hypoxic Respiratory Failure -likely mechanical from abdominal distention -cont supportive care   SBO/severe constipation -CT 11/12 c/w SBO and severe constipation with SB fecalization -Appreciate CCS help -cont NGT for now- may need laxatives from above -does have transition zone so may require OR -cont empiric anbx's   Suspected drug reaction -pt with flushing, redness and itching  11/11 -has PCN allergy so Primaxin was dc'd as a precaution- also considered red man syndrome from [place] so was dc'd  too -1x doses of Pepcid and Benadryl- if sx's persist will need IV Solumedrol-as of 11/12 with resolution   ?? Anemia - Repeat H&H checked, hemoglobin 12.8, hematocrit 36.0, canceled transfusion order    Leukopenia -suspect due to poor perfusion/SIRS sx's -improved    Dehydration with hyponatremia -baseline Hgb 12-presented >15 -since has NGT/SBO will cont IVFs    Type 2 diabetes mellitus -controlled -cont SSI -HOLD Metformin    Thrombocytopenia -chronic and stable    ?? Wide-complex tachycardia -could not find any rhythm strips to confirm -FU on ECHO -keep K > 4.0 as a precaution -pt does have moderate pulmonary HTN and moderate RV dilatation on ECHO    Hypothyroidism -Synthroid change to IV    Poor venous access -placed PICC for fluids and labs   DVT prophylaxis:  SCDs Code Status: Full Family Communication: Extensive d/w daughter in room 11/12 Disposition Plan/Expected LOS: Stepdown  Consultants: None  Procedures: 2-D echocardiogram - Left ventricle: The cavity size was normal. Systolic function was vigorous. The estimated ejection fraction was in the range of 65% to 70%. Doppler parameters are consistent with abnormal left ventricular relaxation (grade 1 diastolic dysfunction). Doppler parameters are consistent with high ventricular filling pressure. - Aortic valve: Moderate thickening and calcification involving the noncoronary cusp. Trivial regurgitation. - Mitral valve: Calcified annulus. - Right ventricle: The cavity size was moderately dilated. Wall thickness was normal. - Pulmonary arteries: Systolic pressure was moderately increased. PA peak pressure: 66mm  Hg (S).   Antibiotics: Rocephin 11/9 >>> 11/9 Primaxin 11/9 >>> 11/11 Vancomycin 11/9 >>> 11/11 Levaquin 11/11 >>>  HPI/Subjective: Patient awake and now endorsing  abdominal pain- no SOB or CP reported  Objective: Blood pressure 136/57, pulse 76, temperature 97.7 F (36.5 C), temperature source Oral, resp. rate 31, height 5' (1.524 m), weight 142 lb 6.7 oz (64.6 kg), SpO2 95.00%.  Intake/Output Summary (Last 24 hours) at 09/28/13 1422 Last data filed at 09/28/13 1224  Gross per 24 hour  Intake 376.33 ml  Output   1275 ml  Net -898.67 ml   Exam: General: No acute respiratory distress Lungs: Clear to auscultation bilaterally without wheezes or crackles, 2L Cardiovascular: Regular rate and rhythm without murmur gallop or rub normal S1 and S2, no peripheral edema or JVD Abdomen: Much less distended now, less tender, NGT to LWS with small volume returns Musculoskeletal: No significant cyanosis, clubbing of bilateral lower extremities Neurological: Alert and oriented x name and ?? place, moves all extremities x 4 without focal neurological deficits, CN 2-12 intact  Scheduled Meds:  Scheduled Meds: . calcium-vitamin D  1 tablet Oral BID WC  . insulin aspart  0-9 Units Subcutaneous TID WC  . levofloxacin (LEVAQUIN) IV  750 mg Intravenous Q48H  . levothyroxine  75 mcg Oral QAC breakfast  . multivitamin-lutein  1 capsule Oral Daily  . omega-3 acid ethyl esters  1 g Oral Daily  . primidone  125 mg Oral QHS  . sodium chloride  10-40 mL Intracatheter Q12H  . sodium chloride  3 mL Intravenous Q12H   Data Reviewed: Basic Metabolic Panel:  Recent Labs Lab 09/24/13 2216 09/24/13 2234 09/25/13 1300 09/26/13 0500 09/27/13 0450 09/28/13 0040  NA  --  132* 132* 132* 144 134*  K  --  4.0 3.8 3.6 4.4 4.0  CL  --  98 105 103 113* 101  CO2  --   --  16* 20 22 21   GLUCOSE  --  109* 105* 128* 164* 132*  BUN  --  11 10 6 12 8   CREATININE  --  1.10 0.69 0.54 0.81 0.59  CALCIUM  --   --  7.2* 7.4* 8.1* 8.3*  MG 1.9  --   --   --   --   --    Liver Function Tests:  Recent Labs Lab 09/25/13 1300 09/27/13 0450 09/28/13 0040  AST 39* 37 25  ALT 30  20 23   ALKPHOS 36* 50 45  BILITOT 0.4 0.5 0.3  PROT 5.2* 5.9* 5.6*  ALBUMIN 2.3* 2.9* 2.4*   No results found for this basename: LIPASE, AMYLASE,  in the last 168 hours No results found for this basename: AMMONIA,  in the last 168 hours CBC:  Recent Labs Lab 09/24/13 2216  09/25/13 1300 09/26/13 0500 09/27/13 0450 09/27/13 0851 09/28/13 0040  WBC 3.4*  --  3.9* 4.1 8.7  --  5.3  NEUTROABS 1.3*  --   --  1.7  --   --   --   HGB 15.4*  < > 12.0 12.4 7.8* 12.8 14.0  HCT 42.7  < > 33.6* 35.1* 23.6* 36.0 38.6  MCV 90.1  --  89.8 89.8 88.1  --  89.6  PLT 145*  --  120* 120* 221  --  169  < > = values in this interval not displayed. CBG:  Recent Labs Lab 09/27/13 1230 09/27/13 1702 09/27/13 2113 09/28/13 0822 09/28/13 1315  GLUCAP 118* 139* 174* 128* 140*  Recent Results (from the past 240 hour(s))  CULTURE, BLOOD (ROUTINE X 2)     Status: None   Collection Time    09/24/13 10:00 PM      Result Value Range Status   Specimen Description BLOOD LEFT ARM   Final   Special Requests BOTTLES DRAWN AEROBIC ONLY 10CC   Final   Culture  Setup Time     Final   Value: 09/25/2013 08:54     Performed at [facility]   Culture     Final   Value:        BLOOD CULTURE RECEIVED NO GROWTH TO DATE CULTURE WILL BE HELD FOR 5 DAYS BEFORE ISSUING A FINAL NEGATIVE REPORT     Performed at [facility]   Report Status PENDING   Incomplete  CULTURE, BLOOD (ROUTINE X 2)     Status: None   Collection Time    09/24/13 10:10 PM      Result Value Range Status   Specimen Description BLOOD LEFT ARM   Final   Special Requests BOTTLES DRAWN AEROBIC ONLY 10CC   Final   Culture  Setup Time     Final   Value: 09/25/2013 08:54     Performed at [facility]   Culture     Final   Value:        BLOOD CULTURE RECEIVED NO GROWTH TO DATE CULTURE WILL BE HELD FOR 5 DAYS BEFORE ISSUING A FINAL NEGATIVE REPORT     Performed at [facility]   Report Status PENDING   Incomplete   URINE CULTURE     Status: None   Collection Time    09/24/13 10:27 PM      Result Value Range Status   Specimen Description URINE, CATHETERIZED   Final   Special Requests NONE   Final   Culture  Setup Time     Final   Value: 09/24/2013 23:22     Performed at [facility] Count     Final   Value: NO GROWTH     Performed at [facility]   Culture     Final   Value: NO GROWTH     Performed at [facility]   Report Status 09/25/2013 FINAL   Final  MRSA PCR SCREENING     Status: None   Collection Time    09/25/13  1:30 AM      Result Value Range Status   MRSA by PCR NEGATIVE  NEGATIVE Final   Comment:            The GeneXpert MRSA Assay (FDA     approved for NASAL specimens     only), is one component of a     comprehensive MRSA colonization     surveillance program. It is not     intended to diagnose MRSA     infection nor to guide or     monitor treatment for     MRSA infections.     Studies:  Recent x-ray studies have been reviewed in detail by the Attending Physician     Junious Silk, ANP [facility] Office  [phone] Pager [phone]  **If unable to reach the above provider after paging please contact the Flow Manager @ [phone]  On-Call/Text Page:      [email or web address]      password [email or web address]  If 7PM-7AM, please contact night-coverage [email or web address] Password [email or web address] 09/28/2013, 2:22 PM   LOS:  4 days       I have examined the patient, reviewed the chart and modified the above note which I agree with.   Yezenia Fredrick,MD [phone] 09/28/2013, 4:47 PM

## 2013-09-28 NOTE — Progress Notes (Signed)
Subjective: Less abdominal pain.  No BM  Objective: Vital signs in last 24 hours: Temp:  [97.9 F (36.6 C)-98.7 F (37.1 C)] 97.9 F (36.6 C) (11/13 0423) Pulse Rate:  [72-79] 75 (11/13 0423) Resp:  [18-35] 18 (11/13 0423) BP: (147-173)/(48-61) 147/48 mmHg (11/13 0423) SpO2:  [88 %-96 %] 88 % (11/13 0423) Weight:  [142 lb 6.7 oz (64.6 kg)] 142 lb 6.7 oz (64.6 kg) (11/13 0423) Last BM Date: 09/26/13  Intake/Output from previous day: 11/12 0701 - 11/13 0700 In: 475.4 [I.V.:475.4] Out: 1050 [Urine:800; Emesis/NG output:250] Intake/Output this shift:    GI: LESS TENDER.  DISTENSION MODERATE no peritonitis  Lab Results:   Recent Labs  09/27/13 0450 09/27/13 0851 09/28/13 0040  WBC 8.7  --  5.3  HGB 7.8* 12.8 14.0  HCT 23.6* 36.0 38.6  PLT 221  --  169   BMET  Recent Labs  09/27/13 0450 09/28/13 0040  NA 144 134*  K 4.4 4.0  CL 113* 101  CO2 22 21  GLUCOSE 164* 132*  BUN 12 8  CREATININE 0.81 0.59  CALCIUM 8.1* 8.3*   PT/INR No results found for this basename: LABPROT, INR,  in the last 72 hours ABG No results found for this basename: PHART, PCO2, PO2, HCO3,  in the last 72 hours  Studies/Results: Ct Abdomen Pelvis W Contrast  09/27/2013   CLINICAL DATA:  Left lower quadrant abdominal pain.  EXAM: CT ABDOMEN AND PELVIS WITH CONTRAST  TECHNIQUE: Multidetector CT imaging of the abdomen and pelvis was performed using the standard protocol following bolus administration of intravenous contrast.  CONTRAST:  80mL OMNIPAQUE IOHEXOL 300 MG/ML  SOLN  COMPARISON:  03/28/2008.  FINDINGS: The lung bases demonstrate small bilateral pleural effusions and overlying atelectasis. The heart is normal in size. No pericardial effusion. The esophagus is grossly normal.  The liver is unremarkable. No focal hepatic lesions or intrahepatic biliary dilatation. The gallbladder is mildly distended has a prominent gallbladder fold. The common bile duct is within normal limits in  caliber. The pancreas appears normal. The spleen is normal in size. Small granulomas are noted. The adrenal glands and kidneys are unremarkable.  The stomach and duodenum are unremarkable. The proximal loops of small bowel are distended with air-fluid levels consistent with obstruction. There is a focal transition point in the mid small bowel located in the mid central pelvis best seen on axial image number 57. The small bowel beyond this point is decompressed/ normal in caliber. I do not see a definite mass. This is likely a tight adhesion. The colon is relatively decompressed. Moderate diverticulosis without findings for acute diverticulitis. There is moderate fluid around the liver and spleen, in the pericolic gutters and in the pelvis. This is likely due to the small bowel obstruction.  No mesenteric or retroperitoneal mass or adenopathy. Stable calcified mesenteric lymph nodes are noted in the right upper pelvis. No free air is identified. Advanced atherosclerotic calcifications involving the aorta and branch vessels but no dissection.  There is a Foley catheter in the bladder. The uterus demonstrates calcified fibroids. The ovaries are grossly normal. No pelvic mass or adenopathy. No inguinal mass or adenopathy. Small left inguinal hernia containing fat is noted.  The bony structures are unremarkable.  IMPRESSION: 1. Small bowel obstruction with a transition point noted in the mid upper central pelvis best seen on axial image number 57. This is likely due to a tight adhesion. 2. Decompressed colon with moderate diverticulosis. 3. Small to moderate amount  of free abdominal/pelvic fluid. 4. Distended gallbladder with prominent fold. 5. Advanced atherosclerotic calcifications involving the aorta and branch vessels. 6. Small bilateral pleural effusions and overlying atelectasis.   Electronically Signed   By: Loralie Champagne M.D.   On: 09/27/2013 16:26   Dg Chest Port 1 View  09/27/2013   CLINICAL DATA:   Question source of systemic inflammatory response syndrome  EXAM: PORTABLE CHEST - 1 VIEW  COMPARISON:  09/24/2013  FINDINGS: Right arm PICC has been placed with the tip in the right atrium.  Progression of bibasilar airspace disease which is mild and may represent atelectasis versus pneumonia. Negative for heart failure or effusion. Underlying chronic lung disease is present.  IMPRESSION: PICC tip in the mid right atrium  Progression of bibasilar atelectasis/ infiltrate.   Electronically Signed   By: Marlan Palau M.D.   On: 09/27/2013 08:37   Dg Abd Portable 1v  09/27/2013   CLINICAL DATA:  Question obstipation  EXAM: PORTABLE ABDOMEN - 1 VIEW  COMPARISON:  CT 03/28/2008  FINDINGS: Normal bowel gas pattern without bowel obstruction. No significant stool in the colon. Negative for ileus.  Motion degraded imaged.  IMPRESSION: No acute abnormality.   Electronically Signed   By: Marlan Palau M.D.   On: 09/27/2013 08:38    Anti-infectives: Anti-infectives   Start     Dose/Rate Route Frequency Ordered Stop   09/26/13 2000  levofloxacin (LEVAQUIN) IVPB 750 mg     750 mg 100 mL/hr over 90 Minutes Intravenous Every 48 hours 09/26/13 1751     09/26/13 0000  vancomycin (VANCOCIN) IVPB 750 mg/150 ml premix  Status:  Discontinued     750 mg 150 mL/hr over 60 Minutes Intravenous Every 24 hours 09/25/13 0118 09/26/13 1432   09/25/13 2200  cefTRIAXone (ROCEPHIN) 2 g in dextrose 5 % 50 mL IVPB  Status:  Discontinued     2 g 100 mL/hr over 30 Minutes Intravenous Every 24 hours 09/24/13 2147 09/24/13 2219   09/25/13 2200  cefTRIAXone (ROCEPHIN) 1 g in dextrose 5 % 50 mL IVPB  Status:  Discontinued     1 g 100 mL/hr over 30 Minutes Intravenous Every 24 hours 09/24/13 2219 09/25/13 0118   09/25/13 0130  vancomycin (VANCOCIN) IVPB 750 mg/150 ml premix     750 mg 150 mL/hr over 60 Minutes Intravenous  Once 09/25/13 0118 09/25/13 0243   09/25/13 0130  imipenem-cilastatin (PRIMAXIN) 250 mg in sodium chloride  0.9 % 100 mL IVPB  Status:  Discontinued     250 mg 200 mL/hr over 30 Minutes Intravenous 3 times per day 09/25/13 0119 09/26/13 1432   09/24/13 2200  cefTRIAXone (ROCEPHIN) 2 g in dextrose 5 % 50 mL IVPB     2 g 100 mL/hr over 30 Minutes Intravenous  Once 09/24/13 2145 09/24/13 2233      Assessment/Plan:  LOS: 4 days  SBO Cont NGT More comfortable today Films NPO Follow labs  Bernadine Melecio A. 09/28/2013

## 2013-09-29 LAB — CBC
Hemoglobin: 11.5 g/dL — ABNORMAL LOW (ref 12.0–15.0)
Hemoglobin: 14 g/dL (ref 12.0–15.0)
MCH: 32.4 pg (ref 26.0–34.0)
MCHC: 36.3 g/dL — ABNORMAL HIGH (ref 30.0–36.0)
MCV: 90.1 fL (ref 78.0–100.0)
Platelets: 168 10*3/uL (ref 150–400)
RBC: 3.55 MIL/uL — ABNORMAL LOW (ref 3.87–5.11)
RDW: 13.4 % (ref 11.5–15.5)
WBC: 4.1 10*3/uL (ref 4.0–10.5)

## 2013-09-29 LAB — BASIC METABOLIC PANEL
CO2: 23 mEq/L (ref 19–32)
GFR calc non Af Amer: 79 mL/min — ABNORMAL LOW (ref >90)
Glucose, Bld: 255 mg/dL — ABNORMAL HIGH (ref 70–99)
Potassium: 5.7 mEq/L — ABNORMAL HIGH (ref 3.5–5.1)
Sodium: 140 mEq/L (ref 135–145)

## 2013-09-29 LAB — GLUCOSE, CAPILLARY
Glucose-Capillary: 115 mg/dL — ABNORMAL HIGH (ref 70–99)
Glucose-Capillary: 157 mg/dL — ABNORMAL HIGH (ref 70–99)
Glucose-Capillary: 94 mg/dL (ref 70–99)

## 2013-09-29 LAB — POTASSIUM: Potassium: 3.9 mEq/L (ref 3.5–5.1)

## 2013-09-29 LAB — CLOSTRIDIUM DIFFICILE BY PCR: Toxigenic C. Difficile by PCR: NEGATIVE

## 2013-09-29 MED ORDER — SODIUM CHLORIDE 0.9 % IV SOLN
INTRAVENOUS | Status: DC
  Administered 2013-09-29: 21:00:00 via INTRAVENOUS

## 2013-09-29 MED ORDER — SODIUM POLYSTYRENE SULFONATE 15 GM/60ML PO SUSP
15.0000 g | Freq: Once | ORAL | Status: AC
  Administered 2013-09-29: 15 g via RECTAL
  Filled 2013-09-29: qty 60

## 2013-09-29 MED ORDER — LEVOTHYROXINE SODIUM 75 MCG PO TABS
75.0000 ug | ORAL_TABLET | Freq: Every day | ORAL | Status: DC
  Administered 2013-09-30 – 2013-10-02 (×3): 75 ug via ORAL
  Filled 2013-09-29 (×4): qty 1

## 2013-09-29 MED ORDER — POLYETHYLENE GLYCOL 3350 17 G PO PACK
17.0000 g | PACK | Freq: Every day | ORAL | Status: DC
  Administered 2013-09-30: 10:00:00 17 g via ORAL
  Filled 2013-09-29 (×2): qty 1

## 2013-09-29 MED ORDER — DEXTROSE-NACL 5-0.9 % IV SOLN
INTRAVENOUS | Status: DC
  Administered 2013-09-29: 07:00:00 via INTRAVENOUS

## 2013-09-29 NOTE — Progress Notes (Addendum)
NURSING PROGRESS NOTE  [name] Colon [id number] Admission Data: 09/29/2013 2129 Attending Provider: Clydia Llano, MD JYN:[id number] L, MD Code Status: Full  [name] Colon is a 79 y.o. female patient admitted from ED:  -No acute distress noted.  -No complaints of shortness of breath.  -No complaints of chest pain.    Blood pressure 166/66, pulse 71, temperature 98.2 F (36.8 C), temperature source Oral, resp. rate 20, height 5' (1.524 m), weight 62 kg (136 lb 11 oz), SpO2 95.00%.   IV Fluids:  PICC in place in RUE, occlusive dsg intact without redness, patent, infusing normal saline.   Allergies:  Doxycycline; Aspirin; and Penicillins  Past Medical History:   has a past medical history of Hypertension; Diabetes mellitus; Thyroid disease; and Claudication.  Past Surgical History:   has past surgical history that includes Appendectomy and Tonsillectomy.  Social History:   reports that she has never smoked. She does not have any smokeless tobacco history on file. She reports that she does not drink alcohol or use illicit drugs.  Skin: Rash on face, otherwise intact  Patient/Family orientated to room. Information packet given to patient/family. Admission inpatient armband information verified with patient/family to include name and date of birth and placed on patient arm. Side rails up x 2, fall assessment and education completed with patient/family. Patient/family able to verbalize understanding of risk associated with falls and verbalized understanding to call for assistance before getting out of bed. Call light within reach. Patient/family able to voice and demonstrate understanding of unit orientation instructions.

## 2013-09-29 NOTE — Progress Notes (Signed)
Films look good try clamping NGT

## 2013-09-29 NOTE — Progress Notes (Signed)
[facility] Progress Note [address] TEAM 1 - Stepdown ICU Team   [name] Colon:[id number] DOB: [birth year] DOA: 09/24/2013 PCP: Willow Ora, MD  Brief narrative: 79 y.o. female was brought to the ER because of increased weakness and was found to be febrile. Patient had recently been treated in the past week by her PCP for possible UTI and was placed on Bactrim.  Since that time she had become extremely weak and was unable to ambulate.  Apparently en route to the hospital the patient had possible V tach for which EMS had given one dose of amiodarone 150 mg IV. EKG in the ER demonstrated normal sinus rhythm with QTC of 418. Troponins were negative and patient denied any chest pain or shortness of breath. Patient found to have bowel obstruction after admission.  Assessment/Plan:    SIRS (systemic inflammatory response syndrome) -sx's related to low perfusion from [facility] in setting of likely bowel obstruction pre admission due to severe obstipation -urine culture no growth -blood cxs NGTD -Antibiotics discontinued   Acute Hypoxic Respiratory Failure -likely mechanical from abdominal distention -cont supportive care   SBO/severe constipation -CT 11/12 c/w SBO and severe constipation with SB fecalization -appreciate CCS consultation -Leaving advancement of diet to CCS. -pull n/g per CCS note.  Patient is not nauseated. -encourage ambulation -per RN 2 small stools 11/12, no stools 11/13, 11/14   Suspected drug reaction -Resolved. -pt with flushing, redness and itching 11/11 -has PCN allergy so Primaxin was dc'd as a precaution - also considered red man syndrome from Vanco so was dc'd  too -1x doses of Pepcid and Benadryl - if sx's persist will need IV Solumedrol - as of 11/12 with resolution    Dehydration with hyponatremia -Resolved. -baseline Hgb 12 - presented >15    Type 2 diabetes mellitus -controlled -cont SSI -HOLD Metformin   Wide-complex  tachycardia -resolved. -keep K > 4.0 as a precaution -pt does have moderate pulmonary HTN and moderate RV dilatation on ECHO    Hypothyroidism -Synthroid    Poor venous access -PICC for fluids and labs  Hyperkalemia -Resolved. With Kayexalate.  DVT prophylaxis:  SCDs Code Status: Full Family Communication: No family present at time of exam today Disposition Plan/Expected LOS: transfer to med/surg, just started on sips, needs PT  Consultants: Surgery  Procedures: 2-D echocardiogram - Left ventricle: The cavity size was normal. Systolic function was vigorous. The estimated ejection fraction was in the range of 65% to 70%. Doppler parameters are consistent with abnormal left ventricular relaxation (grade 1 diastolic dysfunction). Doppler parameters are consistent with high ventricular filling pressure. - Aortic valve: Moderate thickening and calcification involving the noncoronary cusp. Trivial regurgitation. - Mitral valve: Calcified annulus. - Right ventricle: The cavity size was moderately dilated. Wall thickness was normal. - Pulmonary arteries: Systolic pressure was moderately increased. PA peak pressure: 66mm Hg (S).   Antibiotics: Rocephin 11/9 >>> 11/9 Primaxin 11/9 >>> 11/11 Vancomycin 11/9 >>> 11/11 Levaquin 11/11 >>>11/14  HPI/Subjective: Patient desires removal of n/g tube.  Reports she's feeling better. Denies cp or sob.  Objective: Blood pressure 169/57, pulse 75, temperature 98.1 F (36.7 C), temperature source Oral, resp. rate 16, height 5' (1.524 m), weight 64.6 kg (142 lb 6.7 oz), SpO2 99.00%.  Intake/Output Summary (Last 24 hours) at 09/29/13 1421 Last data filed at 09/29/13 0654  Gross per 24 hour  Intake   2195 ml  Output      0 ml  Net   2195 ml  Exam: General:  pleasant, A&O, nad, clamped n/g in place. Lungs: Clear to auscultation bilaterally without wheezes or crackles Cardiovascular: Regular rate and rhythm without murmur gallop or rub  normal S1 and S2, no peripheral edema or JVD Abdomen: Much less distended now, less tender, N/G clamped.  Active bowel sounds. Musculoskeletal: No significant cyanosis, clubbing of bilateral lower extremities Neurological: Alert and oriented, moves all extremities x 4 without focal neurological deficits, CN 2-12 intact  Scheduled Meds:  Scheduled Meds: . insulin aspart  0-9 Units Subcutaneous TID WC  . levothyroxine  37.5 mcg Intravenous Daily  . polyethylene glycol  17 g Oral Daily  . sodium chloride  10-40 mL Intracatheter Q12H  . sodium chloride  3 mL Intravenous Q12H   Data Reviewed: Basic Metabolic Panel:  Recent Labs Lab 09/24/13 2216  09/25/13 1300 09/26/13 0500 09/27/13 0450 09/28/13 0040 09/29/13 0435 09/29/13 1035  NA  --   < > 132* 132* 144 134* 140  --   K  --   < > 3.8 3.6 4.4 4.0 5.7* 3.9  CL  --   < > 105 103 113* 101 111  --   CO2  --   --  16* 20 22 21 23   --   GLUCOSE  --   < > 105* 128* 164* 132* 255*  --   BUN  --   < > 10 6 12 8 6   --   CREATININE  --   < > 0.69 0.54 0.81 0.59 0.57  --   CALCIUM  --   --  7.2* 7.4* 8.1* 8.3* 7.3*  --   MG 1.9  --   --   --   --   --   --   --   < > = values in this interval not displayed.  Liver Function Tests:  Recent Labs Lab 09/25/13 1300 09/27/13 0450 09/28/13 0040  AST 39* 37 25  ALT 30 20 23   ALKPHOS 36* 50 45  BILITOT 0.4 0.5 0.3  PROT 5.2* 5.9* 5.6*  ALBUMIN 2.3* 2.9* 2.4*   CBC:  Recent Labs Lab 09/24/13 2216  09/25/13 1300 09/26/13 0500 09/27/13 0450 09/27/13 0851 09/28/13 0040 09/29/13 0435  WBC 3.4*  --  3.9* 4.1 8.7  --  5.3 4.1  NEUTROABS 1.3*  --   --  1.7  --   --   --   --   HGB 15.4*  < > 12.0 12.4 7.8* 12.8 14.0 11.5*  HCT 42.7  < > 33.6* 35.1* 23.6* 36.0 38.6 32.0*  MCV 90.1  --  89.8 89.8 88.1  --  89.6 90.1  PLT 145*  --  120* 120* 221  --  169 168  < > = values in this interval not displayed. CBG:  Recent Labs Lab 09/28/13 1315 09/28/13 1739 09/28/13 2216  09/29/13 0807 09/29/13 1203  GLUCAP 140* 121* 121* 115* 163*    Recent Results (from the past 240 hour(s))  CULTURE, BLOOD (ROUTINE X 2)     Status: None   Collection Time    09/24/13 10:00 PM      Result Value Range Status   Specimen Description BLOOD LEFT ARM   Final   Special Requests BOTTLES DRAWN AEROBIC ONLY 10CC   Final   Culture  Setup Time     Final   Value: 09/25/2013 08:54     Performed at [facility]   Culture     Final   Value:  BLOOD CULTURE RECEIVED NO GROWTH TO DATE CULTURE WILL BE HELD FOR 5 DAYS BEFORE ISSUING A FINAL NEGATIVE REPORT     Performed at [facility]   Report Status PENDING   Incomplete  CULTURE, BLOOD (ROUTINE X 2)     Status: None   Collection Time    09/24/13 10:10 PM      Result Value Range Status   Specimen Description BLOOD LEFT ARM   Final   Special Requests BOTTLES DRAWN AEROBIC ONLY 10CC   Final   Culture  Setup Time     Final   Value: 09/25/2013 08:54     Performed at [facility]   Culture     Final   Value:        BLOOD CULTURE RECEIVED NO GROWTH TO DATE CULTURE WILL BE HELD FOR 5 DAYS BEFORE ISSUING A FINAL NEGATIVE REPORT     Performed at [facility]   Report Status PENDING   Incomplete  URINE CULTURE     Status: None   Collection Time    09/24/13 10:27 PM      Result Value Range Status   Specimen Description URINE, CATHETERIZED   Final   Special Requests NONE   Final   Culture  Setup Time     Final   Value: 09/24/2013 23:22     Performed at [facility] Count     Final   Value: NO GROWTH     Performed at [facility]   Culture     Final   Value: NO GROWTH     Performed at [facility]   Report Status 09/25/2013 FINAL   Final  MRSA PCR SCREENING     Status: None   Collection Time    09/25/13  1:30 AM      Result Value Range Status   MRSA by PCR NEGATIVE  NEGATIVE Final   Comment:            The GeneXpert MRSA Assay (FDA     approved for NASAL  specimens     only), is one component of a     comprehensive MRSA colonization     surveillance program. It is not     intended to diagnose MRSA     infection nor to guide or     monitor treatment for     MRSA infections.     Studies:  Recent x-ray studies have been reviewed in detail by the Attending Physician    Algis Downs, PA-C [facility] Pager: [phone]  I have personally examined this patient and reviewed the entire database. I have reviewed the above note, made any necessary editorial changes, and agree with its content.  Lonia Blood, MD [facility]

## 2013-09-29 NOTE — Progress Notes (Signed)
ANTIBIOTIC CONSULT NOTE - FOLLOW UP  Pharmacy Consult for Levaquin Indication: rule out sepsis  Allergies  Allergen Reactions  . Doxycycline Anaphylaxis  . Aspirin Nausea And Vomiting  . Penicillins Rash    Patient Measurements: Height: 5' (152.4 cm) Weight: 142 lb 6.7 oz (64.6 kg) IBW/kg (Calculated) : 45.5 Adjusted Body Weight:   Vital Signs: Temp: 98.4 F (36.9 C) (11/14 0835) Temp src: Oral (11/14 0835) BP: 146/72 mmHg (11/14 0835) Pulse Rate: 72 (11/14 0400) Intake/Output from previous day: 11/13 0701 - 11/14 0700 In: 2573 [I.V.:1198; NG/GT:200] Out: 325 [Urine:325] Intake/Output from this shift:    Labs:  Recent Labs  09/27/13 0450 09/27/13 0851 09/28/13 0040 09/29/13 0435  WBC 8.7  --  5.3 4.1  HGB 7.8* 12.8 14.0 11.5*  PLT 221  --  169 168  CREATININE 0.81  --  0.59 0.57   Estimated Creatinine Clearance: 38.4 ml/min (by C-G formula based on Cr of 0.57). No results found for this basename: VANCOTROUGH, VANCOPEAK, VANCORANDOM, GENTTROUGH, GENTPEAK, GENTRANDOM, TOBRATROUGH, TOBRAPEAK, TOBRARND, AMIKACINPEAK, AMIKACINTROU, AMIKACIN,  in the last 72 hours   Microbiology: Recent Results (from the past 720 hour(s))  CULTURE, BLOOD (ROUTINE X 2)     Status: None   Collection Time    09/24/13 10:00 PM      Result Value Range Status   Specimen Description BLOOD LEFT ARM   Final   Special Requests BOTTLES DRAWN AEROBIC ONLY 10CC   Final   Culture  Setup Time     Final   Value: 09/25/2013 08:54     Performed at [facility]   Culture     Final   Value:        BLOOD CULTURE RECEIVED NO GROWTH TO DATE CULTURE WILL BE HELD FOR 5 DAYS BEFORE ISSUING A FINAL NEGATIVE REPORT     Performed at [facility]   Report Status PENDING   Incomplete  CULTURE, BLOOD (ROUTINE X 2)     Status: None   Collection Time    09/24/13 10:10 PM      Result Value Range Status   Specimen Description BLOOD LEFT ARM   Final   Special Requests BOTTLES DRAWN AEROBIC  ONLY 10CC   Final   Culture  Setup Time     Final   Value: 09/25/2013 08:54     Performed at [facility]   Culture     Final   Value:        BLOOD CULTURE RECEIVED NO GROWTH TO DATE CULTURE WILL BE HELD FOR 5 DAYS BEFORE ISSUING A FINAL NEGATIVE REPORT     Performed at [facility]   Report Status PENDING   Incomplete  URINE CULTURE     Status: None   Collection Time    09/24/13 10:27 PM      Result Value Range Status   Specimen Description URINE, CATHETERIZED   Final   Special Requests NONE   Final   Culture  Setup Time     Final   Value: 09/24/2013 23:22     Performed at [facility] Count     Final   Value: NO GROWTH     Performed at [facility]   Culture     Final   Value: NO GROWTH     Performed at [facility]   Report Status 09/25/2013 FINAL   Final  MRSA PCR SCREENING     Status: None  Collection Time    09/25/13  1:30 AM      Result Value Range Status   MRSA by PCR NEGATIVE  NEGATIVE Final   Comment:            The GeneXpert MRSA Assay (FDA     approved for NASAL specimens     only), is one component of a     comprehensive MRSA colonization     surveillance program. It is not     intended to diagnose MRSA     infection nor to guide or     monitor treatment for     MRSA infections.    Anti-infectives   Start     Dose/Rate Route Frequency Ordered Stop   09/26/13 2000  levofloxacin (LEVAQUIN) IVPB 750 mg     750 mg 100 mL/hr over 90 Minutes Intravenous Every 48 hours 09/26/13 1751     09/26/13 0000  vancomycin (VANCOCIN) IVPB 750 mg/150 ml premix  Status:  Discontinued     750 mg 150 mL/hr over 60 Minutes Intravenous Every 24 hours 09/25/13 0118 09/26/13 1432   09/25/13 2200  cefTRIAXone (ROCEPHIN) 2 g in dextrose 5 % 50 mL IVPB  Status:  Discontinued     2 g 100 mL/hr over 30 Minutes Intravenous Every 24 hours 09/24/13 2147 09/24/13 2219   09/25/13 2200  cefTRIAXone (ROCEPHIN) 1 g in dextrose 5 % 50 mL IVPB   Status:  Discontinued     1 g 100 mL/hr over 30 Minutes Intravenous Every 24 hours 09/24/13 2219 09/25/13 0118   09/25/13 0130  vancomycin (VANCOCIN) IVPB 750 mg/150 ml premix     750 mg 150 mL/hr over 60 Minutes Intravenous  Once 09/25/13 0118 09/25/13 0243   09/25/13 0130  imipenem-cilastatin (PRIMAXIN) 250 mg in sodium chloride 0.9 % 100 mL IVPB  Status:  Discontinued     250 mg 200 mL/hr over 30 Minutes Intravenous 3 times per day 09/25/13 0119 09/26/13 1432   09/24/13 2200  cefTRIAXone (ROCEPHIN) 2 g in dextrose 5 % 50 mL IVPB     2 g 100 mL/hr over 30 Minutes Intravenous  Once 09/24/13 2145 09/24/13 2233      Assessment: 89yof to have antibiotics narrowed from Vancomycin and Primaxin to Levaquin r/o SIRS (unknown source of infection). All cultures have been neg. PCT is undetectable. Note also mentioned to stop abx.   Plan:  1. Levaquin 750mg  IV q48h 2. Consider stopping abx

## 2013-09-29 NOTE — Progress Notes (Signed)
Report called to Elige Radon to [id number] 26

## 2013-09-29 NOTE — Progress Notes (Signed)
Patient ID: [name] Colon, female   DOB: May 05, 1920, 79 y.o.   MRN: [id number]    Subjective: Denies abdominal pain.  No nausea.  States she hasn't had a BM or gas, but 2 stools are documents from 2 days ago.  Objective: Vital signs in last 24 hours: Temp:  [97.7 F (36.5 C)-98.7 F (37.1 C)] 98.5 F (36.9 C) (11/14 0400) Pulse Rate:  [63-82] 72 (11/14 0400) Resp:  [12-31] 18 (11/14 0400) BP: (117-136)/(45-103) 117/103 mmHg (11/14 0400) SpO2:  [93 %-99 %] 98 % (11/14 0400) Last BM Date: 09/26/13  Intake/Output from previous day: 11/13 0701 - 11/14 0700 In: 2573 [I.V.:1198; NG/GT:200] Out: 325 [Urine:325] Intake/Output this shift:    PE: Abd: soft, NT, ND, +Bs, NGT with thin watered down bilious output  Lab Results:   Recent Labs  09/28/13 0040 09/29/13 0435  WBC 5.3 4.1  HGB 14.0 11.5*  HCT 38.6 32.0*  PLT 169 168   BMET  Recent Labs  09/28/13 0040 09/29/13 0435  NA 134* 140  K 4.0 5.7*  CL 101 111  CO2 21 23  GLUCOSE 132* 255*  BUN 8 6  CREATININE 0.59 0.57  CALCIUM 8.3* 7.3*   PT/INR No results found for this basename: LABPROT, INR,  in the last 72 hours CMP     Component Value Date/Time   NA 140 09/29/2013 0435   K 5.7* 09/29/2013 0435   CL 111 09/29/2013 0435   CO2 23 09/29/2013 0435   GLUCOSE 255* 09/29/2013 0435   BUN 6 09/29/2013 0435   CREATININE 0.57 09/29/2013 0435   CALCIUM 7.3* 09/29/2013 0435   PROT 5.6* 09/28/2013 0040   ALBUMIN 2.4* 09/28/2013 0040   AST 25 09/28/2013 0040   ALT 23 09/28/2013 0040   ALKPHOS 45 09/28/2013 0040   BILITOT 0.3 09/28/2013 0040   GFRNONAA 79* 09/29/2013 0435   GFRAA >90 09/29/2013 0435   Lipase     Component Value Date/Time   LIPASE 32 12/27/2011 1250       Studies/Results: Ct Abdomen Pelvis W Contrast  09/27/2013   CLINICAL DATA:  Left lower quadrant abdominal pain.  EXAM: CT ABDOMEN AND PELVIS WITH CONTRAST  TECHNIQUE: Multidetector CT imaging of the abdomen and pelvis was performed  using the standard protocol following bolus administration of intravenous contrast.  CONTRAST:  80mL OMNIPAQUE IOHEXOL 300 MG/ML  SOLN  COMPARISON:  03/28/2008.  FINDINGS: The lung bases demonstrate small bilateral pleural effusions and overlying atelectasis. The heart is normal in size. No pericardial effusion. The esophagus is grossly normal.  The liver is unremarkable. No focal hepatic lesions or intrahepatic biliary dilatation. The gallbladder is mildly distended has a prominent gallbladder fold. The common bile duct is within normal limits in caliber. The pancreas appears normal. The spleen is normal in size. Small granulomas are noted. The adrenal glands and kidneys are unremarkable.  The stomach and duodenum are unremarkable. The proximal loops of small bowel are distended with air-fluid levels consistent with obstruction. There is a focal transition point in the mid small bowel located in the mid central pelvis best seen on axial image number 57. The small bowel beyond this point is decompressed/ normal in caliber. I do not see a definite mass. This is likely a tight adhesion. The colon is relatively decompressed. Moderate diverticulosis without findings for acute diverticulitis. There is moderate fluid around the liver and spleen, in the pericolic gutters and in the pelvis. This is likely due to the small bowel obstruction.  No mesenteric or retroperitoneal mass or adenopathy. Stable calcified mesenteric lymph nodes are noted in the right upper pelvis. No free air is identified. Advanced atherosclerotic calcifications involving the aorta and branch vessels but no dissection.  There is a Foley catheter in the bladder. The uterus demonstrates calcified fibroids. The ovaries are grossly normal. No pelvic mass or adenopathy. No inguinal mass or adenopathy. Small left inguinal hernia containing fat is noted.  The bony structures are unremarkable.  IMPRESSION: 1. Small bowel obstruction with a transition point  noted in the mid upper central pelvis best seen on axial image number 57. This is likely due to a tight adhesion. 2. Decompressed colon with moderate diverticulosis. 3. Small to moderate amount of free abdominal/pelvic fluid. 4. Distended gallbladder with prominent fold. 5. Advanced atherosclerotic calcifications involving the aorta and branch vessels. 6. Small bilateral pleural effusions and overlying atelectasis.   Electronically Signed   By: Loralie Champagne M.D.   On: 09/27/2013 16:26   Dg Abd Portable 2v  09/28/2013   CLINICAL DATA:  Abdominal pain, small-bowel obstruction  EXAM: PORTABLE ABDOMEN - 2 VIEW  COMPARISON:  09/27/2013; CT abdomen and pelvis - 09/27/2013  FINDINGS: There has been interval passage of previously ingested enteric contrast, now seen within the colon. No dilated loops of upstream small bowel. No air-fluid levels. No definite evidence of obstruction. No pneumoperitoneum, pneumatosis or portal venous gas. Enteric tube and side port projecting over the location of the stomach.  No acute osseus abnormalities.  IMPRESSION: Nonobstructive bowel gas pattern.   Electronically Signed   By: Simonne Come M.D.   On: 09/28/2013 08:04    Anti-infectives: Anti-infectives   Start     Dose/Rate Route Frequency Ordered Stop   09/26/13 2000  levofloxacin (LEVAQUIN) IVPB 750 mg     750 mg 100 mL/hr over 90 Minutes Intravenous Every 48 hours 09/26/13 1751     09/26/13 0000  vancomycin (VANCOCIN) IVPB 750 mg/150 ml premix  Status:  Discontinued     750 mg 150 mL/hr over 60 Minutes Intravenous Every 24 hours 09/25/13 0118 09/26/13 1432   09/25/13 2200  cefTRIAXone (ROCEPHIN) 2 g in dextrose 5 % 50 mL IVPB  Status:  Discontinued     2 g 100 mL/hr over 30 Minutes Intravenous Every 24 hours 09/24/13 2147 09/24/13 2219   09/25/13 2200  cefTRIAXone (ROCEPHIN) 1 g in dextrose 5 % 50 mL IVPB  Status:  Discontinued     1 g 100 mL/hr over 30 Minutes Intravenous Every 24 hours 09/24/13 2219 09/25/13  0118   09/25/13 0130  vancomycin (VANCOCIN) IVPB 750 mg/150 ml premix     750 mg 150 mL/hr over 60 Minutes Intravenous  Once 09/25/13 0118 09/25/13 0243   09/25/13 0130  imipenem-cilastatin (PRIMAXIN) 250 mg in sodium chloride 0.9 % 100 mL IVPB  Status:  Discontinued     250 mg 200 mL/hr over 30 Minutes Intravenous 3 times per day 09/25/13 0119 09/26/13 1432   09/24/13 2200  cefTRIAXone (ROCEPHIN) 2 g in dextrose 5 % 50 mL IVPB     2 g 100 mL/hr over 30 Minutes Intravenous  Once 09/24/13 2145 09/24/13 2233       Assessment/Plan  1. PSBO, resolving Patient Active Problem List   Diagnosis Date Noted  . Bowel obstruction 09/28/2013  . SIRS (systemic inflammatory response syndrome) 09/25/2013  . Thrombocytopenia 09/25/2013  . Hypothyroidism 09/25/2013  . Leukopenia 09/25/2013  . Poor venous access 09/25/2013  . HTN (hypertension) 09/25/2013  . ??  Wide-complex tachycardia 09/25/2013  . Dehydration with hyponatremia 09/25/2013  . Claudication 08/21/2013  . Type 2 diabetes mellitus 08/21/2013   Plan: 1. Patient's abdominal films yesterday reveal a nonobstructive bowel gas pattern with contrast throughout her colon.  We will clamp NG today and pull if no nausea.  She may have clear liquids if she is able to have her NG removed.   LOS: 5 days    Jveon Pound E 09/29/2013, 8:05 AM Pager: [phone]

## 2013-09-29 NOTE — Progress Notes (Signed)
ngt clamped per order.

## 2013-09-29 NOTE — Progress Notes (Signed)
Pt has a potassium level of 5.7, PA on call Claiborne Billings was notified, orders received.-----Layliana Devins, rn

## 2013-09-30 LAB — BASIC METABOLIC PANEL
BUN: 4 mg/dL — ABNORMAL LOW (ref 6–23)
CO2: 24 mEq/L (ref 19–32)
Calcium: 8.4 mg/dL (ref 8.4–10.5)
Creatinine, Ser: 0.51 mg/dL (ref 0.50–1.10)
GFR calc non Af Amer: 82 mL/min — ABNORMAL LOW (ref >90)
Glucose, Bld: 108 mg/dL — ABNORMAL HIGH (ref 70–99)
Potassium: 3.7 mEq/L (ref 3.5–5.1)
Sodium: 141 mEq/L (ref 135–145)

## 2013-09-30 LAB — GLUCOSE, CAPILLARY
Glucose-Capillary: 108 mg/dL — ABNORMAL HIGH (ref 70–99)
Glucose-Capillary: 182 mg/dL — ABNORMAL HIGH (ref 70–99)
Glucose-Capillary: 173 mg/dL — ABNORMAL HIGH (ref 70–99)
Glucose-Capillary: 80 mg/dL (ref 70–99)

## 2013-09-30 LAB — CBC
Hemoglobin: 12.3 g/dL (ref 12.0–15.0)
MCH: 32 pg (ref 26.0–34.0)
MCHC: 35.2 g/dL (ref 30.0–36.0)
RBC: 3.84 MIL/uL — ABNORMAL LOW (ref 3.87–5.11)
WBC: 4.8 10*3/uL (ref 4.0–10.5)

## 2013-09-30 MED ORDER — DIPHENHYDRAMINE HCL 50 MG/ML IJ SOLN: 12.5000 mg | Freq: Four times a day (QID) | INTRAMUSCULAR | Status: DC | PRN

## 2013-09-30 MED ORDER — POTASSIUM CHLORIDE CRYS ER 20 MEQ PO TBCR
40.0000 meq | EXTENDED_RELEASE_TABLET | Freq: Once | ORAL | Status: AC
  Administered 2013-09-30: 40 meq via ORAL
  Filled 2013-09-30: qty 2

## 2013-09-30 MED ORDER — POLYETHYLENE GLYCOL 3350 17 G PO PACK
17.0000 g | PACK | Freq: Two times a day (BID) | ORAL | Status: DC
  Administered 2013-10-01 – 2013-10-02 (×3): 17 g via ORAL
  Filled 2013-09-30 (×5): qty 1

## 2013-09-30 MED ORDER — MAGNESIUM HYDROXIDE 400 MG/5ML PO SUSP
30.0000 mL | Freq: Once | ORAL | Status: AC
  Administered 2013-09-30: 14:00:00 30 mL via ORAL
  Filled 2013-09-30: qty 30

## 2013-09-30 NOTE — Progress Notes (Signed)
[facility] Progress Note   [name] Colon WUJ:[id number] DOB: 11-30-1919 DOA: 09/24/2013 PCP: Willow Ora, MD  Brief narrative: 79 y.o. female was brought to the ER because of increased weakness and was found to be febrile. Patient had recently been treated in the past week by her PCP for possible UTI and was placed on Bactrim.  Since that time she had become extremely weak and was unable to ambulate.  Apparently en route to the hospital the patient had possible V tach for which EMS had given one dose of amiodarone 150 mg IV. EKG in the ER demonstrated normal sinus rhythm with QTc of 418. Troponins were negative and patient denied any chest pain or shortness of breath. Patient found to have bowel obstruction after admission.  Assessment/Plan:  SIRS (systemic inflammatory response syndrome) -sx's related to low perfusion from [facility] in setting of likely bowel obstruction pre admission due to severe obstipation -urine culture no growth -blood cxs NGTD -Antibiotics discontinued   Acute Hypoxic Respiratory Failure -likely mechanical from abdominal distention -cont supportive care   SBO/severe constipation -CT 11/12 c/w SBO and severe constipation with SB fecalization -appreciate CCS consultation -Leaving advancement of diet to CCS. -pull NGT per CCS note.  Patient is not nauseated. -encourage ambulation, last PT/OT to evaluate and treat. -per RN 2 small stools 11/12, no stools reported since then   Suspected drug reaction -Resolved. -pt with flushing, redness and itching 11/11 -has PCN allergy so Primaxin was dc'd as a precaution - also considered red man syndrome from Vanco so was dc'd  too -1x doses of Pepcid and Benadryl - if sx's persist will need IV Solumedrol - as of 11/12 with resolution    Dehydration with hyponatremia -Resolved. -baseline Hgb 12 - presented >15    Type 2 diabetes mellitus -controlled -cont SSI -HOLD Metformin   Wide-complex  tachycardia -resolved. -keep K > 4.0 as a precaution -pt does have moderate pulmonary HTN and moderate RV dilatation on ECHO    Hypothyroidism -Synthroid    Poor venous access -PICC for fluids and labs  Hyperkalemia -Resolved. With Kayexalate.  DVT prophylaxis:  SCDs Code Status: Full Family Communication: No family present at time of exam today Disposition Plan/Expected LOS: transfer to med/surg, just started on sips, needs PT  Consultants: Surgery  Procedures: 2-D echocardiogram - Left ventricle: The cavity size was normal. Systolic function was vigorous. The estimated ejection fraction was in the range of 65% to 70%. Doppler parameters are consistent with abnormal left ventricular relaxation (grade 1 diastolic dysfunction). Doppler parameters are consistent with high ventricular filling pressure. - Aortic valve: Moderate thickening and calcification involving the noncoronary cusp. Trivial regurgitation. - Mitral valve: Calcified annulus. - Right ventricle: The cavity size was moderately dilated. Wall thickness was normal. - Pulmonary arteries: Systolic pressure was moderately increased. PA peak pressure: 66mm Hg (S).   Antibiotics: Rocephin 11/9 >>> 11/9 Primaxin 11/9 >>> 11/11 Vancomycin 11/9 >>> 11/11 Levaquin 11/11 >>>11/14  HPI/Subjective: Patient desires removal of n/g tube.  Reports she's feeling better. Denies cp or sob.  Objective: Blood pressure 166/66, pulse 75, temperature 98.4 F (36.9 C), temperature source Oral, resp. rate 18, height 5' (1.524 m), weight 62 kg (136 lb 11 oz), SpO2 93.00%.  Intake/Output Summary (Last 24 hours) at 09/30/13 1328 Last data filed at 09/30/13 0603  Gross per 24 hour  Intake 619.17 ml  Output   1400 ml  Net -780.83 ml   Exam: General:  pleasant, A&O, nad, clamped n/g in place.  Lungs: Clear to auscultation bilaterally without wheezes or crackles Cardiovascular: Regular rate and rhythm without murmur gallop or rub  normal S1 and S2, no peripheral edema or JVD Abdomen: Much less distended now, less tender, N/G clamped.  Active bowel sounds. Musculoskeletal: No significant cyanosis, clubbing of bilateral lower extremities Neurological: Alert and oriented, moves all extremities x 4 without focal neurological deficits, CN 2-12 intact  Scheduled Meds:  Scheduled Meds: . insulin aspart  0-9 Units Subcutaneous TID WC  . levothyroxine  75 mcg Oral QAC breakfast  . polyethylene glycol  17 g Oral BID   Data Reviewed: Basic Metabolic Panel:  Recent Labs Lab 09/24/13 2216  09/26/13 0500 09/27/13 0450 09/28/13 0040 09/29/13 0435 09/29/13 1035 09/30/13 0601  NA  --   < > 132* 144 134* 140  --  141  K  --   < > 3.6 4.4 4.0 5.7* 3.9 3.7  CL  --   < > 103 113* 101 111  --  108  CO2  --   < > 20 22 21 23   --  24  GLUCOSE  --   < > 128* 164* 132* 255*  --  108*  BUN  --   < > 6 12 8 6   --  4*  CREATININE  --   < > 0.54 0.81 0.59 0.57  --  0.51  CALCIUM  --   < > 7.4* 8.1* 8.3* 7.3*  --  8.4  MG 1.9  --   --   --   --   --   --   --   < > = values in this interval not displayed.  Liver Function Tests:  Recent Labs Lab 09/25/13 1300 09/27/13 0450 09/28/13 0040  AST 39* 37 25  ALT 30 20 23   ALKPHOS 36* 50 45  BILITOT 0.4 0.5 0.3  PROT 5.2* 5.9* 5.6*  ALBUMIN 2.3* 2.9* 2.4*   CBC:  Recent Labs Lab 09/24/13 2216  09/26/13 0500 09/27/13 0450 09/27/13 0851 09/28/13 0040 09/29/13 0435 09/30/13 0601  WBC 3.4*  < > 4.1 8.7  --  5.3 4.1 4.8  NEUTROABS 1.3*  --  1.7  --   --   --   --   --   HGB 15.4*  < > 12.4 7.8* 12.8 14.0 11.5* 12.3  HCT 42.7  < > 35.1* 23.6* 36.0 38.6 32.0* 34.9*  MCV 90.1  < > 89.8 88.1  --  89.6 90.1 90.9  PLT 145*  < > 120* 221  --  169 168 200  < > = values in this interval not displayed. CBG:  Recent Labs Lab 09/29/13 1203 09/29/13 1707 09/29/13 2213 09/30/13 0742 09/30/13 1146  GLUCAP 163* 157* 94 108* 182*    Recent Results (from the past 240 hour(s))   CULTURE, BLOOD (ROUTINE X 2)     Status: None   Collection Time    09/24/13 10:00 PM      Result Value Range Status   Specimen Description BLOOD LEFT ARM   Final   Special Requests BOTTLES DRAWN AEROBIC ONLY 10CC   Final   Culture  Setup Time     Final   Value: 09/25/2013 08:54     Performed at [facility]   Culture     Final   Value:        BLOOD CULTURE RECEIVED NO GROWTH TO DATE CULTURE WILL BE HELD FOR 5 DAYS BEFORE ISSUING A FINAL NEGATIVE REPORT  Performed at [facility]   Report Status PENDING   Incomplete  CULTURE, BLOOD (ROUTINE X 2)     Status: None   Collection Time    09/24/13 10:10 PM      Result Value Range Status   Specimen Description BLOOD LEFT ARM   Final   Special Requests BOTTLES DRAWN AEROBIC ONLY 10CC   Final   Culture  Setup Time     Final   Value: 09/25/2013 08:54     Performed at [facility]   Culture     Final   Value:        BLOOD CULTURE RECEIVED NO GROWTH TO DATE CULTURE WILL BE HELD FOR 5 DAYS BEFORE ISSUING A FINAL NEGATIVE REPORT     Performed at [facility]   Report Status PENDING   Incomplete  URINE CULTURE     Status: None   Collection Time    09/24/13 10:27 PM      Result Value Range Status   Specimen Description URINE, CATHETERIZED   Final   Special Requests NONE   Final   Culture  Setup Time     Final   Value: 09/24/2013 23:22     Performed at [facility] Count     Final   Value: NO GROWTH     Performed at [facility]   Culture     Final   Value: NO GROWTH     Performed at [facility]   Report Status 09/25/2013 FINAL   Final  MRSA PCR SCREENING     Status: None   Collection Time    09/25/13  1:30 AM      Result Value Range Status   MRSA by PCR NEGATIVE  NEGATIVE Final   Comment:            The GeneXpert MRSA Assay (FDA     approved for NASAL specimens     only), is one component of a     comprehensive MRSA colonization     surveillance program. It is  not     intended to diagnose MRSA     infection nor to guide or     monitor treatment for     MRSA infections.  CLOSTRIDIUM DIFFICILE BY PCR     Status: None   Collection Time    09/29/13  3:08 PM      Result Value Range Status   C difficile by pcr NEGATIVE  NEGATIVE Final     Studies:  Recent x-ray studies have been reviewed in detail by the Attending Physician     Clint Lipps, MD [facility] Pager: [phone] 09/30/2013, 1:31 PM

## 2013-09-30 NOTE — Plan of Care (Signed)
Problem: Phase I Progression Outcomes Goal: Pain controlled with appropriate interventions Outcome: Completed/Met Date Met:  09/30/13 Pt has no complaints of pain at this time

## 2013-09-30 NOTE — Plan of Care (Signed)
Problem: Phase I Progression Outcomes Goal: Voiding-avoid urinary catheter unless indicated Outcome: Not Met (add Reason) Foley catheter inserted prior to admission to this floor.

## 2013-09-30 NOTE — Evaluation (Signed)
Physical Therapy Evaluation Patient Details Name: EBONI COVAL MRN: [id number] DOB: Mar 21, 1920 Today's Date: 09/30/2013 Time: 4098-1191 PT Time Calculation (min): 34 min  PT Assessment / Plan / Recommendation History of Present Illness  Pt adm with increased weakness and was found to be febrile. + UTI, developed sepsis and SIRS.  Clinical Impression  Pt admitted with above conditions. Pt currently severely limited in mobility due to vertigo with + nausea and dry heaves upon sitting EOB. Anticipate pt has BPPV, however need a 2nd person to complete testing due to severity of her symptoms and her limited neck ROM. (Will need to use hospital bed to try to simulate 30 * neck extension and 45* rotation). Pt currently with functional limitations due to the deficits listed below (see PT Problem List). Pt will benefit from skilled PT to increase their independence and safety with mobility--especially once vertigo symptoms subside. Will return today for further BPPV testing and treatment.     PT Assessment  Patient needs continued PT services    Follow Up Recommendations  Other (comment) (TBD once able to mobilize)    Does the patient have the potential to tolerate intense rehabilitation      Barriers to Discharge        Equipment Recommendations  Other (comment) (TBD)    Recommendations for Other Services OT consult   Frequency Min 3X/week    Precautions / Restrictions Precautions Precautions: Fall   Pertinent Vitals/Pain Denied pain       Mobility  Bed Mobility Bed Mobility: Rolling Right;Rolling Left;Right Sidelying to Sit;Sit to Sidelying Right;Scooting to [facility] Rolling Right: 4: Min assist Rolling Left: 4: Min assist Right Sidelying to Sit: 2: Max assist;With rails Sit to Sidelying Right: 3: Mod assist Scooting to HOB: 1: +2 Total assist Scooting to [facility]: Patient Percentage: 0% Details for Bed Mobility Assistance: As pt began to sit up, she reported feeling dizzy and  returned to sidelying, clutching rail. With questioning she reports it was a spinning sensation. Lasted ~30 seconds. Pt came up to sit EOB on 2nd attempt and was clutching bedrail with torso wavering in circlular fashion. No nystagmus noted. Symptoms lasted ~35 seconds. After spinning stopped, she began complaining of nausea which proceeded to dry heaves. Pt returned to sidelying and ultimately supine. Pt rolled Lt to replace pad and also felt spinning, reporting it was worse going to her Lt. Nurse tech assisted with postioning up in the bed. Transfers Transfers: Not assessed Ambulation/Gait Ambulation/Gait Assistance: Not tested (comment)    Exercises Other Exercises Other Exercises: No nystagmus noted with rolling either direction, however onset of symptoms and duration are indicative of BPPV. Explained why I thought she was experiencing these symptoms and possibility that PT can move the otoliths through the canal to decr symptoms dramatically. Explained the treatment will make her feel dizzy and nauseous and she is willing to try. She would like anti-nausea medicine and spoke to RN who can give her IV Zofran before cannalith repositioning.   PT Diagnosis: Difficulty walking;Other (comment) (BPPV)  PT Problem List: Decreased strength;Decreased activity tolerance;Decreased balance;Decreased mobility;Decreased knowledge of use of DME PT Treatment Interventions: DME instruction;Gait training;Functional mobility training;Therapeutic activities;Therapeutic exercise;Balance training;Patient/family education     PT Goals(Current goals can be found in the care plan section) Acute Rehab PT Goals Patient Stated Goal: get better and stronger PT Goal Formulation: With patient Time For Goal Achievement: 10/07/13 Potential to Achieve Goals: Good  Visit Information  Last PT Received On: 09/30/13 Assistance Needed: +2  History of Present Illness: Pt adm with increased weakness and was found to be febrile.  + UTI, developed sepsis and SIRS.       Prior Functioning  Home Living Family/patient expects to be discharged to:: Private residence Living Arrangements: Children Available Help at Discharge: Family Type of Home: House Home Access: Stairs to enter Secretary/administrator of Steps: 1 Home Layout: One level Prior Function Level of Independence: Independent Communication Communication: No difficulties    Cognition  Cognition Arousal/Alertness: Awake/alert Behavior During Therapy: WFL for tasks assessed/performed Overall Cognitive Status: Within Functional Limits for tasks assessed    Extremity/Trunk Assessment Upper Extremity Assessment Upper Extremity Assessment: Generalized weakness Lower Extremity Assessment Lower Extremity Assessment: Generalized weakness Cervical / Trunk Assessment Cervical / Trunk Assessment: Normal   Balance Balance Balance Assessed: Yes Static Sitting Balance Static Sitting - Balance Support: Bilateral upper extremity supported;Feet supported Static Sitting - Level of Assistance: 3: Mod assist Static Sitting - Comment/# of Minutes: wavering due to vertigo  End of Session PT - End of Session Activity Tolerance: Treatment limited secondary to medical complications (Comment) Patient left: in bed;with call bell/phone within reach;with family/visitor present Nurse Communication: Other (comment) (?BPPV and need for nausea meds)  GP     Lisha Vitale 09/30/2013, 12:13 PM Pager [phone]

## 2013-09-30 NOTE — Progress Notes (Signed)
Physical Therapy Treatment Patient Details Name: [name] Colon MRN: [id number] DOB: 03-29-1920 Today's Date: 09/30/2013 Time: 4132-4401 PT Time Calculation (min): 28 min  PT Assessment / Plan / Recommendation  History of Present Illness Pt adm with increased weakness and was found to be febrile. + UTI, developed sepsis and SIRS.   PT Comments   Pt received Zofran prior to BPPV testing and treatment due to severe nausea/dry heaves with earlier attempt at OOB. Pt squeezing her eyes shut during procedures, so no nystagmus noted, however pt with vertigo lasting <30 seconds with each position change and appears to have BPPV. Unclear which canals are involved, although based on her symtomatic reports, Lt posterior canal appeared most likely and treated with Epley maneuver. Will need to reassess 11/16 and possibly treat for Rt posterior canal BPPV.   Follow Up Recommendations  Other (comment) (TBD once able to mobilize)     Does the patient have the potential to tolerate intense rehabilitation     Barriers to Discharge        Equipment Recommendations  Other (comment) (TBD)    Recommendations for Other Services OT consult  Frequency Min 3X/week   Progress towards PT Goals Progress towards PT goals: Progressing toward goals  Plan Discharge plan needs to be updated    Precautions / Restrictions Precautions Precautions: Fall   Pertinent Vitals/Pain Denied pain; nausea after procedure 6/10 (despite Zofran)    Mobility  Bed Mobility Bed Mobility: Rolling Right;Rolling Left;Right Sidelying to Sit;Sit to Sidelying Right;Scooting to [facility] Rolling Right: 4: Min assist Rolling Left: 4: Min assist Right Sidelying to Sit: 2: Max assist;With rails Sit to Sidelying Right: 3: Mod assist Scooting to HOB: 1: +2 Total assist Scooting to [facility]: Patient Percentage: 0% Details for Bed Mobility Assistance: Pt required max to +2 total assist with all bed mobility while performing BPPV assessment and  treatment. Due to nausea and drowsy from Zofran Transfers Transfers: Not assessed Details for Transfer Assistance: Pt too nauseous and drowsy after Epley maneuver to get to chair. Pt assisted with +2 assist to return to upright sitting in bed (HOB 50) Ambulation/Gait Ambulation/Gait Assistance: Not tested (comment)    Exercises Other Exercises Other Exercises: Performed supine head roll with pt reporting mild spinning when her head was turned to the Lt and none when turned to the Rt. She reported this was not as severe as the spininning she felt earlier. Pt placed in trendelenburg, assisted up to long-sitting with +2 assist with + vertigo. Turned head to her left and performed Hallpike-Dix. (Pt had earlier reported rolling to her Lt in bed provoked worst symptoms). Pt shut her eyes with PT unable to open her eyelids to observe for nystagmus (this continued to be a problem throughout testing). Pt reported severe dizziness which lasted <30 seconds, no nausea. Proceeded from this position to perform Epley maneuver for Lt posterior canal BPPV. Pt with vertigo with each position change lasting <30 seconds. Pt did not have nausea until came up to sitting EOB, and this was less severe than earlier today. Educated pt, daughter, RN and NT that pt must stay upright in bed x 1 hour (1400) and would be great for her to try to get OOB later today to assess for further vertigo.    PT Diagnosis: Difficulty walking;Other (comment) (BPPV)  PT Problem List: Decreased strength;Decreased activity tolerance;Decreased balance;Decreased mobility;Decreased knowledge of use of DME PT Treatment Interventions: DME instruction;Gait training;Functional mobility training;Therapeutic activities;Therapeutic exercise;Balance training;Patient/family education   PT Goals (  current goals can now be found in the care plan section) Acute Rehab PT Goals Patient Stated Goal: get better and stronger PT Goal Formulation: With patient Time  For Goal Achievement: 10/07/13 Potential to Achieve Goals: Good  Visit Information  Last PT Received On: 09/30/13 Assistance Needed: +2 History of Present Illness: Pt adm with increased weakness and was found to be febrile. + UTI, developed sepsis and SIRS.    Subjective Data  Patient Stated Goal: get better and stronger   Cognition  Cognition Arousal/Alertness: Lethargic;Suspect due to medications Behavior During Therapy: WFL for tasks assessed/performed Overall Cognitive Status: Within Functional Limits for tasks assessed    Balance  Balance Balance Assessed: Yes Static Sitting Balance Static Sitting - Balance Support: Bilateral upper extremity supported;Feet supported Static Sitting - Level of Assistance: 3: Mod assist;4: Min assist Static Sitting - Comment/# of Minutes: upon sitting EOB at completion of Epley maneuver, pt initially dizzy and nauseous and required mod assist to balance. As she settled down, she progressed to min assist.  End of Session PT - End of Session Activity Tolerance: Treatment limited secondary to medical complications (Comment) (vertigo and nausea) Patient left: in bed;with call bell/phone within reach;with family/visitor present Nurse Communication: Other (comment) (upright sitting until 1400; OOB later today)   GP     Mayer Vondrak 09/30/2013, 1:40 PM Pager [phone]

## 2013-09-30 NOTE — Progress Notes (Signed)
<principal problem not specified>  Assessment: Improved, ileus vs sbo  Plan: Would slowly advance diet   Subjective: Feels ok, not in pain, no BM today, but two yesterday, mild nausea when she sits up, otherwise none, taking clears  Objective: Vital signs in last 24 hours: Temp:  [97.9 F (36.6 C)-98.4 F (36.9 C)] 98.4 F (36.9 C) (11/15 0557) Pulse Rate:  [66-75] 75 (11/15 0557) Resp:  [16-22] 18 (11/15 0557) BP: (166-175)/(51-66) 166/66 mmHg (11/15 0557) SpO2:  [93 %-100 %] 93 % (11/15 0557) Weight:  [136 lb 11 oz (62 kg)] 136 lb 11 oz (62 kg) (11/14 2129) Last BM Date: 09/29/13  Intake/Output from previous day: 11/14 0701 - 11/15 0700 In: 619.2 [P.O.:240; I.V.:379.2] Out: 1400 [Urine:1400]  General appearance: alert, cooperative and no distress Resp: clear to auscultation bilaterally GI: soft, non-tender; bowel sounds normal; no masses,  no organomegaly  Lab Results:  Results for orders placed during the hospital encounter of 09/24/13 (from the past 24 hour(s))  GLUCOSE, CAPILLARY     Status: Abnormal   Collection Time    09/29/13 12:03 PM      Result Value Range   Glucose-Capillary 163 (*) 70 - 99 mg/dL  CLOSTRIDIUM DIFFICILE BY PCR     Status: None   Collection Time    09/29/13  3:08 PM      Result Value Range   C difficile by pcr NEGATIVE  NEGATIVE  GLUCOSE, CAPILLARY     Status: Abnormal   Collection Time    09/29/13  5:07 PM      Result Value Range   Glucose-Capillary 157 (*) 70 - 99 mg/dL  GLUCOSE, CAPILLARY     Status: None   Collection Time    09/29/13 10:13 PM      Result Value Range   Glucose-Capillary 94  70 - 99 mg/dL  BASIC METABOLIC PANEL     Status: Abnormal   Collection Time    09/30/13  6:01 AM      Result Value Range   Sodium 141  135 - 145 mEq/L   Potassium 3.7  3.5 - 5.1 mEq/L   Chloride 108  96 - 112 mEq/L   CO2 24  19 - 32 mEq/L   Glucose, Bld 108 (*) 70 - 99 mg/dL   BUN 4 (*) 6 - 23 mg/dL   Creatinine, Ser 1.32  0.50 - 1.10  mg/dL   Calcium 8.4  8.4 - 44.0 mg/dL   GFR calc non Af Amer 82 (*) >90 mL/min   GFR calc Af Amer >90  >90 mL/min  CBC     Status: Abnormal   Collection Time    09/30/13  6:01 AM      Result Value Range   WBC 4.8  4.0 - 10.5 K/uL   RBC 3.84 (*) 3.87 - 5.11 MIL/uL   Hemoglobin 12.3  12.0 - 15.0 g/dL   HCT 10.2 (*) 72.5 - 36.6 %   MCV 90.9  78.0 - 100.0 fL   MCH 32.0  26.0 - 34.0 pg   MCHC 35.2  30.0 - 36.0 g/dL   RDW 44.0  34.7 - 42.5 %   Platelets 200  150 - 400 K/uL  GLUCOSE, CAPILLARY     Status: Abnormal   Collection Time    09/30/13  7:42 AM      Result Value Range   Glucose-Capillary 108 (*) 70 - 99 mg/dL     Studies/Results Radiology     MEDS,  Scheduled . insulin aspart  0-9 Units Subcutaneous TID WC  . levothyroxine  75 mcg Oral QAC breakfast  . polyethylene glycol  17 g Oral Daily       LOS: 6 days    Currie [name], MD, [facility], [place] [phone]   09/30/2013 11:53 AM

## 2013-10-01 LAB — CBC
Hemoglobin: 12 g/dL (ref 12.0–15.0)
MCV: 92.3 fL (ref 78.0–100.0)
Platelets: 230 10*3/uL (ref 150–400)
RBC: 3.75 MIL/uL — ABNORMAL LOW (ref 3.87–5.11)
RDW: 13.6 % (ref 11.5–15.5)
WBC: 4 10*3/uL (ref 4.0–10.5)

## 2013-10-01 LAB — GLUCOSE, CAPILLARY
Glucose-Capillary: 111 mg/dL — ABNORMAL HIGH (ref 70–99)
Glucose-Capillary: 154 mg/dL — ABNORMAL HIGH (ref 70–99)

## 2013-10-01 LAB — CULTURE, BLOOD (ROUTINE X 2): Culture: NO GROWTH

## 2013-10-01 LAB — BASIC METABOLIC PANEL
CO2: 25 mEq/L (ref 19–32)
Chloride: 108 mEq/L (ref 96–112)
GFR calc Af Amer: >90 mL/min (ref >90)
Sodium: 139 mEq/L (ref 135–145)

## 2013-10-01 MED ORDER — TORSEMIDE 20 MG PO TABS
20.0000 mg | ORAL_TABLET | Freq: Every day | ORAL | Status: DC
  Administered 2013-10-01 – 2013-10-02 (×2): 20 mg via ORAL
  Filled 2013-10-01 (×2): qty 1

## 2013-10-01 MED ORDER — MAGNESIUM HYDROXIDE 400 MG/5ML PO SUSP
30.0000 mL | Freq: Once | ORAL | Status: AC
  Administered 2013-10-01: 12:00:00 30 mL via ORAL
  Filled 2013-10-01: qty 30

## 2013-10-01 NOTE — Progress Notes (Signed)
Physical Therapy Treatment Patient Details Name: [name] Colon MRN: [id number] DOB: [birth year] Today's Date: 10/01/2013 Time: 6578-4696 PT Time Calculation (min): 60 min  PT Assessment / Plan / Recommendation  History of Present Illness Pt adm with increased weakness and was found to be febrile. + UTI, developed sepsis and SIRS.   PT Comments   Pt reporting improved symptoms.  Retested left posterior canal with modified dix hallpike and asymptomatic.  Upon right return to sitting from dix hallpike, onset significant dizziness, nausea, NO nystagmus.  Pt endorses hx ear infections 'years ago' unable to recall which side.  Presentation consistent with peripheral hypofunction perhaps RIGHT > LEFT and likely exacerbated by historically limited physical activity.  Recommend continue acute PT at least 1-2 more days with focus on habituation and adaptation exercises in order to facilitate OOB/functional mobility and determine appropriate discharge destination.  Will see again next date in order expedite as pt is able to tolerate, recommend premedicate with antiemetics prior to sessions, and encourage OOB with assist with nursing as pt able.     Follow Up Recommendations  Other (comment) (still unclear, pt unable to perform signif mobility)     Does the patient have the potential to tolerate intense rehabilitation     Barriers to Discharge        Equipment Recommendations       Recommendations for Other Services    Frequency Min 3X/week   Progress towards PT Goals Progress towards PT goals: Progressing toward goals (slowly with new s/s revealed this visit see summary)  Plan Current plan remains appropriate    Precautions / Restrictions Restrictions Other Position/Activity Restrictions: vestibular symptoms aggravated by right sidelying to sit this visit.     Pertinent Vitals/Pain No pain, some musculoskeletal discomfort with bed mobility    Mobility  Bed Mobility Bed Mobility: Rolling  Right;Rolling Left;Right Sidelying to Sit;Left Sidelying to Sit;Supine to Sit;Sit to Supine;Sit to Sidelying Right;Sit to Sidelying Left Rolling Right: 4: Min assist Rolling Left: 4: Min assist Right Sidelying to Sit: 2: Max assist;With rails Left Sidelying to Sit: 2: Max assist;With rails Supine to Sit: 2: Max assist;HOB elevated Sit to Supine: 2: Max assist;HOB flat Sit to Sidelying Right: 3: Mod assist Sit to Sidelying Left: 3: Mod assist Details for Bed Mobility Assistance: Most bed mobility performed as part of vestibular reassess/treatment, see balance section for details of vestibular component.  Pt requires assist for all repostioning in bed including verbal and tactile cues to reach for rail and to self-assist.  Rolling left/right to positon bed pan after failed attempt to transfer to bsc.  needs physical help to raise, support and postion trunk to EOB from every position, more so when also symptomatic from vestibular stimulation.  attempted to use pillows for increased comfort with sit to right sidelying and provided ongoing cervical and thoracic support for modifled sidelying/habituation treatment.  Following loose BM and pericare pt declined further mobility or OOB.  Pt was medicated for nausea between EOB activities and positioning on bedpan. Transfers Transfers: Sit to Stand;Stand to Sit Sit to Stand: 2: Max assist;With upper extremity assist;From bed Stand to Sit: 2: Max assist;With upper extremity assist;To bed Details for Transfer Assistance: Assisted to stand with front guard techniques and cues/assist at posterior pelvis, pt's symptoms increased and unable to step to right to [facility].  Returned to sitting and then supine for bedpan Ambulation/Gait Ambulation/Gait Assistance: Not tested (comment) Stairs: No Wheelchair Mobility Wheelchair Mobility: No    Exercises Other Exercises  Other Exercises: seated habituation primarily to right:  Initially tested left sidelying in modified  dix hallpike with no onset or report of symptoms.  Upon sidelying to right over 3 trials pt denied symptoms however slow onset and persitent presence of dizziness upon return to sit from right side.  due to body discomfort in position used pillow at right trunk to support and minimize pressure at spine and right shoulder.  became nauseated, dry heaved, no vomiting, RN called and administered medication. Other Exercises: semi-recumbant, head supported x1 viewing with far target: Pt instructed to focus on word on sign under TV on wall at foot of bed, performed x10 then x20 horizontal head turns with vision fixed and report of mild symptoms.  instructed to repeat at fastest speed possible AND mild symptoms x 20 rotations for 5 sessions throughtout day. Pt tolerated and agreed.  Explaind to daughter too.   PT Diagnosis:    PT Problem List:   PT Treatment Interventions:     PT Goals (current goals can now be found in the care plan section)    Visit Information  Last PT Received On: 10/01/13 Assistance Needed: +2 History of Present Illness: Pt adm with increased weakness and was found to be febrile. + UTI, developed sepsis and SIRS.    Subjective Data  Subjective: I was feeling better.  Why do I feel bad again?   Cognition  Cognition Arousal/Alertness: Awake/alert Behavior During Therapy: Flat affect Overall Cognitive Status: Within Functional Limits for tasks assessed    Balance  Static Sitting Balance Static Sitting - Balance Support: Left upper extremity supported;Feet supported Static Sitting - Level of Assistance: 4: Min assist Static Sitting - Comment/# of Minutes: up to 10 minutes total between bouts of sidelying with trunk support and cues due to dizziness, pt able to maintain sitting unassisted for several minutes intermittently  End of Session PT - End of Session Activity Tolerance: Treatment limited secondary to medical complications (Comment) Patient left: in bed;with call  bell/phone within reach;with family/visitor present Nurse Communication: Mobility status (OOB later if agreeable, Pt instructed on benefits.)   GP     Dennis Bast 10/01/2013, 1:58 PM

## 2013-10-01 NOTE — Progress Notes (Signed)
[facility] Progress Note   [name] Colon:[id number] DOB: [birth year] DOA: 09/24/2013 PCP: Willow Ora, MD  HPI/Subjective: Felt dizzy yesterday with ambulation with PT, but had a chair for 1 hour. Had bowel movement early evening yesterday.   Brief narrative: 79 y.o. female was brought to the ER because of increased weakness and was found to be febrile. Patient had recently been treated in the past week by her PCP for possible UTI and was placed on Bactrim.  Since that time she had become extremely weak and was unable to ambulate.  Apparently en route to the hospital the patient had possible V tach for which EMS had given one dose of amiodarone 150 mg IV. EKG in the ER demonstrated normal sinus rhythm with QTc of 418. Troponins were negative and patient denied any chest pain or shortness of breath. Patient found to have bowel obstruction after admission.  Assessment/Plan:  SIRS (systemic inflammatory response syndrome) -sx's related to low perfusion from [facility] in setting of likely bowel obstruction pre admission due to severe obstipation -urine culture no growth -blood cxs NGTD -Antibiotics discontinued   Acute Hypoxic Respiratory Failure -likely mechanical from abdominal distention -cont supportive care   SBO/severe constipation -CT 11/12 c/w SBO and severe constipation with SB fecalization -appreciate CCS consultation -Leaving advancement of diet to CCS. -pull NGT per CCS note.  Patient is not nauseated. -encourage ambulation,  PT/OT to evaluate and treat. -Had another bowel movement yesterday.   Suspected drug reaction -Resolved. -pt with flushing, redness and itching 11/11 -has PCN allergy so Primaxin was dc'd as a precaution - also considered red man syndrome from [facility] so was dc'd  too -1x doses of Pepcid and Benadryl - if sx's persist will need IV Solumedrol - as of 11/12 with resolution    Dehydration with hyponatremia -Resolved. -baseline Hgb 12 -  presented >15    Type 2 diabetes mellitus -controlled -cont SSI -HOLD Metformin   Wide-complex tachycardia -resolved. -keep K > 4.0 as a precaution -pt does have moderate pulmonary HTN and moderate RV dilatation on ECHO    Hypothyroidism -Synthroid    Poor venous access -PICC for fluids and labs  Hyperkalemia -Resolved. With Kayexalate.  DVT prophylaxis:  SCDs Code Status: Full Family Communication: No family present at time of exam today Disposition Plan/Expected LOS: transfer to med/surg, just started on sips, needs PT  Consultants: Surgery  Procedures: 2-D echocardiogram - Left ventricle: The cavity size was normal. Systolic function was vigorous. The estimated ejection fraction was in the range of 65% to 70%. Doppler parameters are consistent with abnormal left ventricular relaxation (grade 1 diastolic dysfunction). Doppler parameters are consistent with high ventricular filling pressure. - Aortic valve: Moderate thickening and calcification involving the noncoronary cusp. Trivial regurgitation. - Mitral valve: Calcified annulus. - Right ventricle: The cavity size was moderately dilated. Wall thickness was normal. - Pulmonary arteries: Systolic pressure was moderately increased. PA peak pressure: 66mm Hg (S).   Antibiotics: Rocephin 11/9 >>> 11/9 Primaxin 11/9 >>> 11/11 Vancomycin 11/9 >>> 11/11 Levaquin 11/11 >>>11/14    Objective: Blood pressure 177/98, pulse 68, temperature 98 F (36.7 C), temperature source Oral, resp. rate 18, height 5' (1.524 m), weight 62 kg (136 lb 11 oz), SpO2 96.00%.  Intake/Output Summary (Last 24 hours) at 10/01/13 1102 Last data filed at 10/01/13 0608  Gross per 24 hour  Intake      0 ml  Output   1200 ml  Net  -1200 ml   Exam: General:  pleasant, A&O, nad, clamped n/g in place. Lungs: Clear to auscultation bilaterally without wheezes or crackles Cardiovascular: Regular rate and rhythm without murmur gallop or rub normal  S1 and S2, no peripheral edema or JVD Abdomen: Much less distended now, less tender, N/G clamped.  Active bowel sounds. Musculoskeletal: No significant cyanosis, clubbing of bilateral lower extremities Neurological: Alert and oriented, moves all extremities x 4 without focal neurological deficits, CN 2-12 intact  Scheduled Meds:  Scheduled Meds: . insulin aspart  0-9 Units Subcutaneous TID WC  . levothyroxine  75 mcg Oral QAC breakfast  . polyethylene glycol  17 g Oral BID   Data Reviewed: Basic Metabolic Panel:  Recent Labs Lab 09/24/13 2216  09/27/13 0450 09/28/13 0040 09/29/13 0435 09/29/13 1035 09/30/13 0601 10/01/13 0446  NA  --   < > 144 134* 140  --  141 139  K  --   < > 4.4 4.0 5.7* 3.9 3.7 4.0  CL  --   < > 113* 101 111  --  108 108  CO2  --   < > 22 21 23   --  24 25  GLUCOSE  --   < > 164* 132* 255*  --  108* 106*  BUN  --   < > 12 8 6   --  4* 4*  CREATININE  --   < > 0.81 0.59 0.57  --  0.51 0.48*  CALCIUM  --   < > 8.1* 8.3* 7.3*  --  8.4 8.6  MG 1.9  --   --   --   --   --   --   --   < > = values in this interval not displayed.  Liver Function Tests:  Recent Labs Lab 09/25/13 1300 09/27/13 0450 09/28/13 0040  AST 39* 37 25  ALT 30 20 23   ALKPHOS 36* 50 45  BILITOT 0.4 0.5 0.3  PROT 5.2* 5.9* 5.6*  ALBUMIN 2.3* 2.9* 2.4*   CBC:  Recent Labs Lab 09/24/13 2216  09/26/13 0500 09/27/13 0450 09/27/13 0851 09/28/13 0040 09/29/13 0435 09/30/13 0601 10/01/13 0446  WBC 3.4*  < > 4.1 8.7  --  5.3 4.1 4.8 4.0  NEUTROABS 1.3*  --  1.7  --   --   --   --   --   --   HGB 15.4*  < > 12.4 7.8* 12.8 14.0 11.5* 12.3 12.0  HCT 42.7  < > 35.1* 23.6* 36.0 38.6 32.0* 34.9* 34.6*  MCV 90.1  < > 89.8 88.1  --  89.6 90.1 90.9 92.3  PLT 145*  < > 120* 221  --  169 168 200 230  < > = values in this interval not displayed. CBG:  Recent Labs Lab 09/30/13 0742 09/30/13 1146 09/30/13 1633 09/30/13 2101 10/01/13 0723  GLUCAP 108* 182* 173* 80 111*    Recent  Results (from the past 240 hour(s))  CULTURE, BLOOD (ROUTINE X 2)     Status: None   Collection Time    09/24/13 10:00 PM      Result Value Range Status   Specimen Description BLOOD LEFT ARM   Final   Special Requests BOTTLES DRAWN AEROBIC ONLY 10CC   Final   Culture  Setup Time     Final   Value: 09/25/2013 08:54     Performed at [facility]   Culture     Final   Value:        BLOOD CULTURE RECEIVED NO  GROWTH TO DATE CULTURE WILL BE HELD FOR 5 DAYS BEFORE ISSUING A FINAL NEGATIVE REPORT     Performed at [facility]   Report Status PENDING   Incomplete  CULTURE, BLOOD (ROUTINE X 2)     Status: None   Collection Time    09/24/13 10:10 PM      Result Value Range Status   Specimen Description BLOOD LEFT ARM   Final   Special Requests BOTTLES DRAWN AEROBIC ONLY 10CC   Final   Culture  Setup Time     Final   Value: 09/25/2013 08:54     Performed at [facility]   Culture     Final   Value:        BLOOD CULTURE RECEIVED NO GROWTH TO DATE CULTURE WILL BE HELD FOR 5 DAYS BEFORE ISSUING A FINAL NEGATIVE REPORT     Performed at [facility]   Report Status PENDING   Incomplete  URINE CULTURE     Status: None   Collection Time    09/24/13 10:27 PM      Result Value Range Status   Specimen Description URINE, CATHETERIZED   Final   Special Requests NONE   Final   Culture  Setup Time     Final   Value: 09/24/2013 23:22     Performed at [facility] Count     Final   Value: NO GROWTH     Performed at [facility]   Culture     Final   Value: NO GROWTH     Performed at [facility]   Report Status 09/25/2013 FINAL   Final  MRSA PCR SCREENING     Status: None   Collection Time    09/25/13  1:30 AM      Result Value Range Status   MRSA by PCR NEGATIVE  NEGATIVE Final   Comment:            The GeneXpert MRSA Assay (FDA     approved for NASAL specimens     only), is one component of a     comprehensive MRSA  colonization     surveillance program. It is not     intended to diagnose MRSA     infection nor to guide or     monitor treatment for     MRSA infections.  CLOSTRIDIUM DIFFICILE BY PCR     Status: None   Collection Time    09/29/13  3:08 PM      Result Value Range Status   C difficile by pcr NEGATIVE  NEGATIVE Final     Studies:  Recent x-ray studies have been reviewed in detail by the Attending Physician     Clint Lipps, MD [facility] Pager: [phone] 10/01/2013, 11:02 AM

## 2013-10-01 NOTE — Progress Notes (Signed)
  Subjective: Tolerating clears, no N?V, no abdominal pain, says she had a BM yesterday but it is not recorded  Objective: Vital signs in last 24 hours: Temp:  [98 F (36.7 C)-98.1 F (36.7 C)] 98 F (36.7 C) (11/16 0605) Pulse Rate:  [68-71] 68 (11/16 0605) Resp:  [18] 18 (11/16 0605) BP: (163-177)/(71-98) 177/98 mmHg (11/16 0605) SpO2:  [91 %-96 %] 96 % (11/16 0605) Last BM Date: 09/30/13  Intake/Output from previous day: 11/15 0701 - 11/16 0700 In: -  Out: 1200 [Urine:1200] Intake/Output this shift:    General appearance: alert Resp: clear to auscultation bilaterally Cardio: S1, S2 normal GI: soft, NT, ND< active BS  Lab Results:   Recent Labs  09/30/13 0601 10/01/13 0446  WBC 4.8 4.0  HGB 12.3 12.0  HCT 34.9* 34.6*  PLT 200 230   BMET  Recent Labs  09/30/13 0601 10/01/13 0446  NA 141 139  K 3.7 4.0  CL 108 108  CO2 24 25  GLUCOSE 108* 106*  BUN 4* 4*  CREATININE 0.51 0.48*  CALCIUM 8.4 8.6   PT/INR No results found for this basename: LABPROT, INR,  in the last 72 hours ABG No results found for this basename: PHART, PCO2, PO2, HCO3,  in the last 72 hours  Studies/Results: No results found.  Anti-infectives: Anti-infectives   Start     Dose/Rate Route Frequency Ordered Stop   09/26/13 2000  levofloxacin (LEVAQUIN) IVPB 750 mg  Status:  Discontinued     750 mg 100 mL/hr over 90 Minutes Intravenous Every 48 hours 09/26/13 1751 09/29/13 1406   09/26/13 0000  vancomycin (VANCOCIN) IVPB 750 mg/150 ml premix  Status:  Discontinued     750 mg 150 mL/hr over 60 Minutes Intravenous Every 24 hours 09/25/13 0118 09/26/13 1432   09/25/13 2200  cefTRIAXone (ROCEPHIN) 2 g in dextrose 5 % 50 mL IVPB  Status:  Discontinued     2 g 100 mL/hr over 30 Minutes Intravenous Every 24 hours 09/24/13 2147 09/24/13 2219   09/25/13 2200  cefTRIAXone (ROCEPHIN) 1 g in dextrose 5 % 50 mL IVPB  Status:  Discontinued     1 g 100 mL/hr over 30 Minutes Intravenous Every  24 hours 09/24/13 2219 09/25/13 0118   09/25/13 0130  vancomycin (VANCOCIN) IVPB 750 mg/150 ml premix     750 mg 150 mL/hr over 60 Minutes Intravenous  Once 09/25/13 0118 09/25/13 0243   09/25/13 0130  imipenem-cilastatin (PRIMAXIN) 250 mg in sodium chloride 0.9 % 100 mL IVPB  Status:  Discontinued     250 mg 200 mL/hr over 30 Minutes Intravenous 3 times per day 09/25/13 0119 09/26/13 1432   09/24/13 2200  cefTRIAXone (ROCEPHIN) 2 g in dextrose 5 % 50 mL IVPB     2 g 100 mL/hr over 30 Minutes Intravenous  Once 09/24/13 2145 09/24/13 2233      Assessment/Plan: SBO - resolving. Advance to full liquids. I spoke to her daughter as well.  LOS: 7 days    [name] Colon 10/01/2013

## 2013-10-02 LAB — GLUCOSE, CAPILLARY
Glucose-Capillary: 104 mg/dL — ABNORMAL HIGH (ref 70–99)
Glucose-Capillary: 129 mg/dL — ABNORMAL HIGH (ref 70–99)
Glucose-Capillary: 125 mg/dL — ABNORMAL HIGH (ref 70–99)

## 2013-10-02 LAB — GI PATHOGEN PANEL BY PCR, STOOL
E coli (ETEC) LT/ST: NEGATIVE
E coli (STEC): NEGATIVE
G lamblia by PCR: NEGATIVE
Norovirus GI/GII: NEGATIVE
Salmonella by PCR: NEGATIVE
Shigella by PCR: NEGATIVE

## 2013-10-02 MED ORDER — POLYETHYLENE GLYCOL 3350 17 G PO PACK: 17.0000 g | PACK | Freq: Every day | ORAL | Status: DC

## 2013-10-02 MED ORDER — ACETAMINOPHEN 325 MG PO TABS: 650.0000 mg | ORAL_TABLET | Freq: Four times a day (QID) | ORAL | Status: DC | PRN

## 2013-10-02 NOTE — Progress Notes (Deleted)
[facility] Progress Note   [name] Colon ZOX:[id number] DOB: 06-27-1920 DOA: 09/24/2013 PCP: Willow Ora, MD  HPI/Subjective: No complaints.  Patient reports she is having bowel movement.  No difficulties with a D3 diet for lunch.  Brief narrative: 79 y.o. female was brought to the ER because of increased weakness and was found to be febrile. Patient had been treated in the past week by her PCP for possible UTI and was placed on Bactrim.  Since that time she had become extremely weak and was unable to ambulate.  Apparently en route to the hospital the patient had possible V tach for which EMS had given one dose of amiodarone 150 mg IV. EKG in the ER demonstrated normal sinus rhythm with QTc of 418. Troponins were negative and patient denied any chest pain or shortness of breath. Patient found to have bowel obstruction after admission.  Assessment/Plan:  SIRS (systemic inflammatory response syndrome) -sx's related to low perfusion from [facility] in setting of likely bowel obstruction pre admission due to severe obstipation -urine culture no growth -blood cxs NGTD -Antibiotics discontinued   Acute Hypoxic Respiratory Failure -likely mechanical from abdominal distention -cont supportive care   SBO/severe constipation -CT 11/12 c/w SBO and severe constipation with SB fecalization -appreciate CCS consultation -Diet advance to D3. -encourage ambulation.      Suspected drug reaction -Resolved. -pt with flushing, redness and itching 11/11 -has PCN allergy so Primaxin was dc'd as a precaution - also considered red man syndrome from Vanco so was dc'd  too -1x doses of Pepcid and Benadryl - if sx's persist will need IV Solumedrol - as of 11/12 with resolution    Dehydration with hyponatremia -Resolved. -baseline Hgb 12 - presented >15    Type 2 diabetes mellitus -controlled -cont SSI -HOLD Metformin   Wide-complex tachycardia -resolved. -keep K > 4.0 as a precaution -pt  does have moderate pulmonary HTN and moderate RV dilatation on ECHO    Hypothyroidism -Synthroid    Poor venous access -PICC for fluids and labs  Hyperkalemia -Resolved. With Kayexalate.  Deconditioning: -Weak and dizzy when working with PT. -SNF recommended. -Will request [facility] in am.  DVT prophylaxis:  SCDs Code Status: Full Family Communication: daughter at bedside. Disposition Plan/Expected LOS: d/c to SNF  Consultants: Surgery  Procedures: 2-D echocardiogram - Left ventricle: The cavity size was normal. Systolic function was vigorous. The estimated ejection fraction was in the range of 65% to 70%. Doppler parameters are consistent with abnormal left ventricular relaxation (grade 1 diastolic dysfunction). Doppler parameters are consistent with high ventricular filling pressure. - Aortic valve: Moderate thickening and calcification involving the noncoronary cusp. Trivial regurgitation. - Mitral valve: Calcified annulus. - Right ventricle: The cavity size was moderately dilated. Wall thickness was normal. - Pulmonary arteries: Systolic pressure was moderately increased. PA peak pressure: 66mm Hg (S).   Antibiotics: Rocephin 11/9 >>> 11/9 Primaxin 11/9 >>> 11/11 Vancomycin 11/9 >>> 11/11 Levaquin 11/11 >>>11/14    Objective: Blood pressure 151/74, pulse 68, temperature 98.4 F (36.9 C), temperature source Oral, resp. rate 18, height 5' (1.524 m), weight 62 kg (136 lb 11 oz), SpO2 95.00%.  Intake/Output Summary (Last 24 hours) at 10/02/13 1352 Last data filed at 10/02/13 0830  Gross per 24 hour  Intake    340 ml  Output   2500 ml  Net  -2160 ml   Exam: General:  pleasant, A&O, nad.  Lungs: Clear to auscultation bilaterally without wheezes or crackles Cardiovascular: Regular rate and rhythm without  murmur gallop or rub normal S1 and S2, no peripheral edema or JVD Abdomen: soft, nt, nd, +bs, no masses   Musculoskeletal: No significant cyanosis, clubbing  of bilateral lower extremities Neurological: Alert and oriented, moves all extremities x 4 without focal neurological deficits, CN 2-12 intact Skin:  Dry skin.  Scheduled Meds:  Scheduled Meds: . insulin aspart  0-9 Units Subcutaneous TID WC  . levothyroxine  75 mcg Oral QAC breakfast  . [START ON 10/03/2013] polyethylene glycol  17 g Oral Daily  . torsemide  20 mg Oral Daily   Data Reviewed: Basic Metabolic Panel:  Recent Labs Lab 09/27/13 0450 09/28/13 0040 09/29/13 0435 09/29/13 1035 09/30/13 0601 10/01/13 0446  NA 144 134* 140  --  141 139  K 4.4 4.0 5.7* 3.9 3.7 4.0  CL 113* 101 111  --  108 108  CO2 22 21 23   --  24 25  GLUCOSE 164* 132* 255*  --  108* 106*  BUN 12 8 6   --  4* 4*  CREATININE 0.81 0.59 0.57  --  0.51 0.48*  CALCIUM 8.1* 8.3* 7.3*  --  8.4 8.6    Liver Function Tests:  Recent Labs Lab 09/27/13 0450 09/28/13 0040  AST 37 25  ALT 20 23  ALKPHOS 50 45  BILITOT 0.5 0.3  PROT 5.9* 5.6*  ALBUMIN 2.9* 2.4*   CBC:  Recent Labs Lab 09/26/13 0500 09/27/13 0450 09/27/13 0851 09/28/13 0040 09/29/13 0435 09/30/13 0601 10/01/13 0446  WBC 4.1 8.7  --  5.3 4.1 4.8 4.0  NEUTROABS 1.7  --   --   --   --   --   --   HGB 12.4 7.8* 12.8 14.0 11.5* 12.3 12.0  HCT 35.1* 23.6* 36.0 38.6 32.0* 34.9* 34.6*  MCV 89.8 88.1  --  89.6 90.1 90.9 92.3  PLT 120* 221  --  169 168 200 230   CBG:  Recent Labs Lab 10/01/13 0723 10/01/13 1139 10/01/13 1701 10/02/13 0745 10/02/13 1208  GLUCAP 111* 154* 164* 104* 129*    Recent Results (from the past 240 hour(s))  CULTURE, BLOOD (ROUTINE X 2)     Status: None   Collection Time    09/24/13 10:00 PM      Result Value Range Status   Specimen Description BLOOD LEFT ARM   Final   Special Requests BOTTLES DRAWN AEROBIC ONLY 10CC   Final   Culture  Setup Time     Final   Value: 09/25/2013 08:54     Performed at [facility]   Culture     Final   Value: NO GROWTH 5 DAYS     Performed at [facility]   Report Status 10/01/2013 FINAL   Final  CULTURE, BLOOD (ROUTINE X 2)     Status: None   Collection Time    09/24/13 10:10 PM      Result Value Range Status   Specimen Description BLOOD LEFT ARM   Final   Special Requests BOTTLES DRAWN AEROBIC ONLY 10CC   Final   Culture  Setup Time     Final   Value: 09/25/2013 08:54     Performed at [facility]   Culture     Final   Value: NO GROWTH 5 DAYS     Performed at [facility]   Report Status 10/01/2013 FINAL   Final  URINE CULTURE     Status: None   Collection Time  09/24/13 10:27 PM      Result Value Range Status   Specimen Description URINE, CATHETERIZED   Final   Special Requests NONE   Final   Culture  Setup Time     Final   Value: 09/24/2013 23:22     Performed at [facility] Count     Final   Value: NO GROWTH     Performed at [facility]   Culture     Final   Value: NO GROWTH     Performed at [facility]   Report Status 09/25/2013 FINAL   Final  MRSA PCR SCREENING     Status: None   Collection Time    09/25/13  1:30 AM      Result Value Range Status   MRSA by PCR NEGATIVE  NEGATIVE Final   Comment:            The GeneXpert MRSA Assay (FDA     approved for NASAL specimens     only), is one component of a     comprehensive MRSA colonization     surveillance program. It is not     intended to diagnose MRSA     infection nor to guide or     monitor treatment for     MRSA infections.  CLOSTRIDIUM DIFFICILE BY PCR     Status: None   Collection Time    09/29/13  3:08 PM      Result Value Range Status   C difficile by pcr NEGATIVE  NEGATIVE Final     Studies:  Recent x-ray studies have been reviewed in detail by the Attending Physician     Conley Canal [facility] Pager: [phone] 10/02/2013, 1:52 PM

## 2013-10-02 NOTE — Clinical Social Work Psychosocial (Signed)
Clinical Social Work Department BRIEF PSYCHOSOCIAL ASSESSMENT 10/02/2013  Patient:  [name] Colon, [name] Colon     Account Number:  [id number]     Admit date:  09/24/2013  Clinical Social Worker:  Lavell Luster  Date/Time:  10/02/2013 01:00 PM  Referred by:  Physician  Date Referred:  10/02/2013 Referred for  SNF Placement   Other Referral:   Interview type:  Patient Other interview type:   Patient alert and oriented at time of assessment.    PSYCHOSOCIAL DATA Living Status:  ALONE Admitted from facility:   Level of care:   Primary support name:  Leotis Shames (daughter) Primary support relationship to patient:  CHILD, ADULT Degree of support available:   Patient has strong family support.    CURRENT CONCERNS Current Concerns  Post-Acute Placement   Other Concerns:    SOCIAL WORK ASSESSMENT / PLAN CSW met with patient to discuss recommendation to for SNF. Patient is agreeable to SNF. Patient is from home alone. Patient has daughter and son. Patient has past SNF experience with [facility].   Assessment/plan status:  Psychosocial Support/Ongoing Assessment of Needs Other assessment/ plan:   Complete FL2, Fax, PASRR   Information/referral to community resources:   CSW contact info and SNF list given to patient.    PATIENT'S/FAMILY'S RESPONSE TO PLAN OF CARE: Patient agreeable to SNF placement. Patient was pleasant and appreciative of CSW contact. Patient awaits bed offers.     Roddie Mc, [place] [address], [place], [phone]

## 2013-10-02 NOTE — Progress Notes (Signed)
Pt was bladder scanned post urethral catheter removal, scan showed no urine volume. Pt was comfortable in bed and had no complaints related to abdomen. Pt has voided during shift via bedpan.

## 2013-10-02 NOTE — Discharge Summary (Signed)
Addendum  Patient seen and examined, chart and data base reviewed.  I agree with the above assessment and plan.  For full details please see Mrs. Algis Downs PA note.   Clint Lipps, MD [facility] Pager: [phone] 10/02/2013, 4:27 PM

## 2013-10-02 NOTE — Progress Notes (Signed)
Agree with above 

## 2013-10-02 NOTE — Progress Notes (Signed)
Physical Therapy Treatment Patient Details Name: [name] Colon MRN: [id number] DOB: [birth year] Today's Date: 10/02/2013 Time: 7829-5621 PT Time Calculation (min): 38 min  PT Assessment / Plan / Recommendation  History of Present Illness Pt adm with increased weakness and was found to be febrile. + UTI, developed sepsis and SIRS.   PT Comments   Patient continues to struggle with mobility due to symptoms of dizziness and generalized weakness.  She may have temporary help from daughter in town from [place], but agrees she needs rehab and has been to [facility] in the past.  Recommend [facility] for both mobility and vestibular rehab prior to d/c home.  Follow Up Recommendations  SNF     Does the patient have the potential to tolerate intense rehabilitation   N/A  Barriers to Discharge  None      Equipment Recommendations  Rolling walker with 5" wheels    Recommendations for Other Services  None  Frequency Min 3X/week   Progress towards PT Goals Progress towards PT goals: Progressing toward goals  Plan Discharge plan needs to be updated    Precautions / Restrictions Precautions Precautions: Fall Restrictions Weight Bearing Restrictions: No   Pertinent Vitals/Pain No pain complaints    Mobility  Bed Mobility Bed Mobility: Sitting - Scoot to Edge of Bed Supine to Sit: HOB elevated;With rails;3: Mod assist Sitting - Scoot to Edge of Bed: 3: Mod assist Details for Bed Mobility Assistance: increased time given and cues for task initiation, pt weak and tremoring with need for support in getting up to edge of bed.  did complain of dizziness after up to sitting. Transfers Sit to Stand: 4: Min assist;From chair/3-in-1;From bed;With upper extremity assist Stand to Sit: 4: Min assist;To chair/3-in-1 Details for Transfer Assistance: cues for safety with hand placement and incresed time for pt participation; cues for eyes on stationary target to minimize symptoms of  dizziness Ambulation/Gait Ambulation/Gait Assistance: 4: Min assist Ambulation Distance (Feet): 12 Feet (x 2) Assistive device: Rolling walker Ambulation/Gait Assistance Details: again, cues for visual compensatory strategy to improve tolerance to upright, took about 3 minutes to go 12' due to stopping and c/o light headed, cues for visual use for compensating for vestibular hypofunction Gait Pattern: Step-to pattern;Decreased stride length;Trunk flexed    Exercises Other Exercises Other Exercises: Seated adaptation with gaze stability exercises in chair with back supported and feet off floor x 30 seconds vertical VOR and 30 seconds horizontal VOR with target 3' away and cues for increasd head motion.   PT Goals (current goals can now be found in the care plan section)    Visit Information  Last PT Received On: 10/02/13 Assistance Needed: +1 History of Present Illness: Pt adm with increased weakness and was found to be febrile. + UTI, developed sepsis and SIRS.    Subjective Data   I want to try (to walk.)   Cognition  Cognition Arousal/Alertness: Awake/alert Behavior During Therapy: WFL for tasks assessed/performed Overall Cognitive Status: Within Functional Limits for tasks assessed    Balance  Static Sitting Balance Static Sitting - Balance Support: Bilateral upper extremity supported;Feet unsupported Static Sitting - Level of Assistance: 5: Stand by assistance;4: Min assist Static Sitting - Comment/# of Minutes: sat edge of bed about 5 minutes with assist to help decrease dizziness, loss of balance to right x 1 with self recovery  End of Session PT - End of Session Equipment Utilized During Treatment: Gait belt Activity Tolerance: Patient limited by fatigue;Other (comment) (dizziness) Patient  left: in chair;with call bell/phone within reach Nurse Communication: Mobility status   GP     [facility] 10/02/2013, 11:00 AM Sheran Lawless, PT [phone] 10/02/2013

## 2013-10-02 NOTE — Discharge Summary (Addendum)
Physician Discharge Summary  [name] Colon HYQ:[id number] DOB: 1920-11-14 DOA: 09/24/2013  PCP: Willow Ora, MD  Admit date: 09/24/2013 Discharge date: 10/02/2013  Time spent: 50 minutes  Recommendations for Outpatient Follow-up:  Discharge to SNF for PT / OT rehabilitation Check bmet in 1 week. Check TSH  -- Contributing to slow bowel movements?  Discharge Diagnoses:  Active Problems:   Type 2 diabetes mellitus   SIRS (systemic inflammatory response syndrome)   Thrombocytopenia   Hypothyroidism   Leukopenia   Poor venous access   HTN (hypertension)   ?? Wide-complex tachycardia   Dehydration with hyponatremia   Bowel obstruction   Discharge Condition: stable.  Tolerating a soft diet.  Diet recommendation: soft bland.  Filed Weights   09/26/13 0436 09/28/13 0423 09/29/13 2129  Weight: 64.6 kg (142 lb 6.7 oz) 64.6 kg (142 lb 6.7 oz) 62 kg (136 lb 11 oz)    History of present illness:  79 y.o. female was brought to the ER because of increased weakness and was found to be febrile. Patient had recently been treated in the past week by her PCP for possible UTI and was placed on Bactrim. Since that time she had become extremely weak and was unable to ambulate. Apparently en route to the hospital the patient had possible V tach for which EMS had given one dose of amiodarone 150 mg IV. EKG in the ER demonstrated normal sinus rhythm with QTc of 418. Troponins were negative and patient denied any chest pain or shortness of breath. Patient found to have bowel obstruction after admission.   Hospital Course:  SIRS (systemic inflammatory response syndrome)  -sx's related to low perfusion from dehydration in setting of likely bowel obstruction pre admission due to severe obstipation  -urine culture no growth  -blood cxs show no growth. -Antibiotics discontinued   Acute Hypoxic Respiratory Failure  -Resolved. -likely mechanical from abdominal distention  -cont supportive care    SBO/severe constipation  -CT 11/12 c/w Small bowel obstruction and severe constipation with SB fecalization  -general surgery was consulted and followed the patient thru out her hospitalization. -Nasal Gastric tube was placed for approximately 48 hours and then removed with no further nausea or vomiting. -Now having bowel movements. -recommend daily miralax to encourage daily bowel movements  Suspected drug reaction  -Resolved.  -pt with flushing, redness and itching 11/11  -has PCN allergy so Primaxin was dc'd as a precaution - also considered red man syndrome from Vanco so was dc'd too  -1x doses of Pepcid and Benadryl - if sx's persist will need IV Solumedrol - as of 11/12 with resolution   Dehydration with hyponatremia  -Resolved.  -baseline Hgb 12 - presented >15   Type 2 diabetes mellitus  -controlled.  No issues. -cont SSI  -Resume Metformin at discharge.  Wide-complex tachycardia  -resolved.  -keep K > 4.0 as a precaution  -pt does have moderate pulmonary HTN and moderate RV dilatation on ECHO   Hypothyroidism  -Synthroid  -Check TSH outpatient.  Poor venous access  -PICC for fluids and labs was required.  Hyperkalemia  -Resolved. With Kayexalate. -On potassium supplementation  -Monitor K outpatient.  DVT prophylaxis: SCDs  Code Status: Full  Family Communication: No family present at time of exam today    Consultants:  Surgery   Procedures:  2-D echocardiogram  - Left ventricle: The cavity size was normal. Systolic function was vigorous. The estimated ejection fraction was in the range of 65% to 70%. Doppler parameters are  consistent with abnormal left ventricular relaxation (grade 1 diastolic dysfunction). Doppler parameters are consistent with high ventricular filling pressure. - Aortic valve: Moderate thickening and calcification involving the noncoronary cusp. Trivial regurgitation. - Mitral valve: Calcified annulus. - Right ventricle: The cavity  size was moderately dilated. Wall thickness was normal. - Pulmonary arteries: Systolic pressure was moderately increased. PA peak pressure: 66mm Hg (S).   Discharge Exam: Filed Vitals:   10/02/13 1300  BP: 151/74  Pulse: 68  Temp: 98.4 F (36.9 C)  Resp: 18    General:  Awake, orientated, pleasant, lying in bed. Lungs: Clear to auscultation bilaterally without wheezes or crackles  Cardiovascular: Regular rate and rhythm without murmur gallop or rub normal S1 and S2, no peripheral edema or JVD  Abdomen: Much less distended now, less tender, N/G clamped. Active bowel sounds.  Musculoskeletal: No significant cyanosis, clubbing of bilateral lower extremities  Neurological: Alert and oriented, moves all extremities x 4 without focal neurological deficits, CN 2-12 intact   Discharge Instructions      Discharge Orders   Future Orders Complete By Expires   Diet Carb Modified  As directed    Increase activity slowly  As directed        Medication List    STOP taking these medications       sulfamethoxazole-trimethoprim 800-160 MG per tablet  Commonly known as:  BACTRIM DS      TAKE these medications       acetaminophen 325 MG tablet  Commonly known as:  TYLENOL  Take 2 tablets (650 mg total) by mouth every 6 (six) hours as needed for mild pain (or Fever >/= 101).     alendronate 70 MG tablet  Commonly known as:  FOSAMAX  Take 70 mg by mouth once a week. Take with a full glass of water on an empty stomach.     calcium-vitamin D 500-200 MG-UNIT per tablet  Commonly known as:  OSCAL WITH D  Take 1 tablet by mouth 2 (two) times daily.     Cholecalciferol 2000 UNITS Caps  Take 2,000 Units by mouth daily.     fish oil-omega-3 fatty acids 1000 MG capsule  Take 2 g by mouth daily.     hydrOXYzine 25 MG capsule  Commonly known as:  VISTARIL  Take 25 mg by mouth 3 (three) times daily as needed for itching.     levothyroxine 75 MCG tablet  Commonly known as:  SYNTHROID,  LEVOTHROID  Take 75 mcg by mouth daily before breakfast.     metFORMIN 500 MG (MOD) 24 hr tablet  Commonly known as:  GLUMETZA  Take 500 mg by mouth daily with breakfast.     multivitamin with minerals tablet  Take 1 tablet by mouth daily.     multivitamin-lutein Caps capsule  Take 1 capsule by mouth daily.     OVER THE COUNTER MEDICATION  Take 1 tablet by mouth daily as needed (for allergies    otc allergy relief tablet).     polyethylene glycol packet  Commonly known as:  MIRALAX / GLYCOLAX  Take 17 g by mouth daily. Do not take if you are having diarrhea.   Take powder in 1/2 cup of liquid.     potassium chloride SA 20 MEQ tablet  Commonly known as:  K-DUR,KLOR-CON  Take 20 mEq by mouth daily.     primidone 250 MG tablet  Commonly known as:  MYSOLINE  Take 125 mg by mouth at bedtime.  torsemide 20 MG tablet  Commonly known as:  DEMADEX  Take 20 mg by mouth daily.     ZANTAC PO  Take 1 tablet by mouth daily as needed (for indigestion).       Allergies  Allergen Reactions  . Doxycycline Anaphylaxis  . Aspirin Nausea And Vomiting  . Penicillins Rash   Follow-up Information   Follow up with ANDY,CAMILLE L, MD. Schedule an appointment as soon as possible for a visit in 1 week.   Specialty:  Family Medicine   Contact information:   [address] [place] [address] [phone]        The results of significant diagnostics from this hospitalization (including imaging, microbiology, ancillary and laboratory) are listed below for reference.    Significant Diagnostic Studies: Ct Abdomen Pelvis W Contrast  09/27/2013   CLINICAL DATA:  Left lower quadrant abdominal pain.  EXAM: CT ABDOMEN AND PELVIS WITH CONTRAST  TECHNIQUE: Multidetector CT imaging of the abdomen and pelvis was performed using the standard protocol following bolus administration of intravenous contrast.  CONTRAST:  80mL OMNIPAQUE IOHEXOL 300 MG/ML  SOLN  COMPARISON:  03/28/2008.   FINDINGS: The lung bases demonstrate small bilateral pleural effusions and overlying atelectasis. The heart is normal in size. No pericardial effusion. The esophagus is grossly normal.  The liver is unremarkable. No focal hepatic lesions or intrahepatic biliary dilatation. The gallbladder is mildly distended has a prominent gallbladder fold. The common bile duct is within normal limits in caliber. The pancreas appears normal. The spleen is normal in size. Small granulomas are noted. The adrenal glands and kidneys are unremarkable.  The stomach and duodenum are unremarkable. The proximal loops of small bowel are distended with air-fluid levels consistent with obstruction. There is a focal transition point in the mid small bowel located in the mid central pelvis best seen on axial image number 57. The small bowel beyond this point is decompressed/ normal in caliber. I do not see a definite mass. This is likely a tight adhesion. The colon is relatively decompressed. Moderate diverticulosis without findings for acute diverticulitis. There is moderate fluid around the liver and spleen, in the pericolic gutters and in the pelvis. This is likely due to the small bowel obstruction.  No mesenteric or retroperitoneal mass or adenopathy. Stable calcified mesenteric lymph nodes are noted in the right upper pelvis. No free air is identified. Advanced atherosclerotic calcifications involving the aorta and branch vessels but no dissection.  There is a Foley catheter in the bladder. The uterus demonstrates calcified fibroids. The ovaries are grossly normal. No pelvic mass or adenopathy. No inguinal mass or adenopathy. Small left inguinal hernia containing fat is noted.  The bony structures are unremarkable.  IMPRESSION: 1. Small bowel obstruction with a transition point noted in the mid upper central pelvis best seen on axial image number 57. This is likely due to a tight adhesion. 2. Decompressed colon with moderate  diverticulosis. 3. Small to moderate amount of free abdominal/pelvic fluid. 4. Distended gallbladder with prominent fold. 5. Advanced atherosclerotic calcifications involving the aorta and branch vessels. 6. Small bilateral pleural effusions and overlying atelectasis.   Electronically Signed   By: Loralie Champagne M.D.   On: 09/27/2013 16:26   Dg Chest Port 1 View  09/27/2013   CLINICAL DATA:  Question source of systemic inflammatory response syndrome  EXAM: PORTABLE CHEST - 1 VIEW  COMPARISON:  09/24/2013  FINDINGS: Right arm PICC has been placed with the tip in the right atrium.  Progression of bibasilar airspace disease which is mild and may represent atelectasis versus pneumonia. Negative for heart failure or effusion. Underlying chronic lung disease is present.  IMPRESSION: PICC tip in the mid right atrium  Progression of bibasilar atelectasis/ infiltrate.   Electronically Signed   By: Marlan Palau M.D.   On: 09/27/2013 08:37   Dg Chest Port 1 View  (if Code Sepsis Called)  09/24/2013   CLINICAL DATA:  Tachycardia  EXAM: PORTABLE CHEST - 1 VIEW  COMPARISON:  03/02/2011, 06/09/2013  FINDINGS: Heart size upper normal. Vascular pattern normal. Mild background interstitial change stable. No focal consolidation.  IMPRESSION: No active disease.   Electronically Signed   By: Esperanza Heir M.D.   On: 09/24/2013 22:05   Dg Abd Portable 1v  09/27/2013   CLINICAL DATA:  Question obstipation  EXAM: PORTABLE ABDOMEN - 1 VIEW  COMPARISON:  CT 03/28/2008  FINDINGS: Normal bowel gas pattern without bowel obstruction. No significant stool in the colon. Negative for ileus.  Motion degraded imaged.  IMPRESSION: No acute abnormality.   Electronically Signed   By: Marlan Palau M.D.   On: 09/27/2013 08:38   Dg Abd Portable 2v  09/28/2013   CLINICAL DATA:  Abdominal pain, small-bowel obstruction  EXAM: PORTABLE ABDOMEN - 2 VIEW  COMPARISON:  09/27/2013; CT abdomen and pelvis - 09/27/2013  FINDINGS: There has been  interval passage of previously ingested enteric contrast, now seen within the colon. No dilated loops of upstream small bowel. No air-fluid levels. No definite evidence of obstruction. No pneumoperitoneum, pneumatosis or portal venous gas. Enteric tube and side port projecting over the location of the stomach.  No acute osseus abnormalities.  IMPRESSION: Nonobstructive bowel gas pattern.   Electronically Signed   By: Simonne Come M.D.   On: 09/28/2013 08:04    Microbiology: Recent Results (from the past 240 hour(s))  CULTURE, BLOOD (ROUTINE X 2)     Status: None   Collection Time    09/24/13 10:00 PM      Result Value Range Status   Specimen Description BLOOD LEFT ARM   Final   Special Requests BOTTLES DRAWN AEROBIC ONLY 10CC   Final   Culture  Setup Time     Final   Value: 09/25/2013 08:54     Performed at [facility]   Culture     Final   Value: NO GROWTH 5 DAYS     Performed at [facility]   Report Status 10/01/2013 FINAL   Final  CULTURE, BLOOD (ROUTINE X 2)     Status: None   Collection Time    09/24/13 10:10 PM      Result Value Range Status   Specimen Description BLOOD LEFT ARM   Final   Special Requests BOTTLES DRAWN AEROBIC ONLY 10CC   Final   Culture  Setup Time     Final   Value: 09/25/2013 08:54     Performed at [facility]   Culture     Final   Value: NO GROWTH 5 DAYS     Performed at [facility]   Report Status 10/01/2013 FINAL   Final  URINE CULTURE     Status: None   Collection Time    09/24/13 10:27 PM      Result Value Range Status   Specimen Description URINE, CATHETERIZED   Final   Special Requests NONE   Final   Culture  Setup Time     Final  Value: 09/24/2013 23:22     Performed at [facility] Count     Final   Value: NO GROWTH     Performed at [facility]   Culture     Final   Value: NO GROWTH     Performed at [facility]   Report Status 09/25/2013 FINAL   Final  MRSA PCR  SCREENING     Status: None   Collection Time    09/25/13  1:30 AM      Result Value Range Status   MRSA by PCR NEGATIVE  NEGATIVE Final   Comment:            The GeneXpert MRSA Assay (FDA     approved for NASAL specimens     only), is one component of a     comprehensive MRSA colonization     surveillance program. It is not     intended to diagnose MRSA     infection nor to guide or     monitor treatment for     MRSA infections.  CLOSTRIDIUM DIFFICILE BY PCR     Status: None   Collection Time    09/29/13  3:08 PM      Result Value Range Status   C difficile by pcr NEGATIVE  NEGATIVE Final     Labs: Basic Metabolic Panel:  Recent Labs Lab 09/27/13 0450 09/28/13 0040 09/29/13 0435 09/29/13 1035 09/30/13 0601 10/01/13 0446  NA 144 134* 140  --  141 139  K 4.4 4.0 5.7* 3.9 3.7 4.0  CL 113* 101 111  --  108 108  CO2 22 21 23   --  24 25  GLUCOSE 164* 132* 255*  --  108* 106*  BUN 12 8 6   --  4* 4*  CREATININE 0.81 0.59 0.57  --  0.51 0.48*  CALCIUM 8.1* 8.3* 7.3*  --  8.4 8.6   Liver Function Tests:  Recent Labs Lab 09/27/13 0450 09/28/13 0040  AST 37 25  ALT 20 23  ALKPHOS 50 45  BILITOT 0.5 0.3  PROT 5.9* 5.6*  ALBUMIN 2.9* 2.4*   CBC:  Recent Labs Lab 09/26/13 0500 09/27/13 0450 09/27/13 0851 09/28/13 0040 09/29/13 0435 09/30/13 0601 10/01/13 0446  WBC 4.1 8.7  --  5.3 4.1 4.8 4.0  NEUTROABS 1.7  --   --   --   --   --   --   HGB 12.4 7.8* 12.8 14.0 11.5* 12.3 12.0  HCT 35.1* 23.6* 36.0 38.6 32.0* 34.9* 34.6*  MCV 89.8 88.1  --  89.6 90.1 90.9 92.3  PLT 120* 221  --  169 168 200 230   Cardiac Enzymes: No results found for this basename: CKTOTAL, CKMB, CKMBINDEX, TROPONINI,  in the last 168 hours CBG:  Recent Labs Lab 10/01/13 0723 10/01/13 1139 10/01/13 1701 10/02/13 0745 10/02/13 1208  GLUCAP 111* 154* 164* 104* 129*       Signed:  Conley Canal [phone] [facility] 10/02/2013, 3:42 PM

## 2013-10-02 NOTE — Clinical Social Work Note (Signed)
CSW has left messages with [facility] per patient's request to determine if patient can have this facility. CSW has left messages at 2:13PM and 2:50PM. CSW has encourage patient to determine a backup SNF as she will DC from the hospital today.   Roddie Mc, [place], [place], [phone]

## 2013-10-02 NOTE — Discharge Summary (Signed)
Addendum  Patient seen and examined, chart and data base reviewed.  I agree with the above assessment and plan.  For full details please see Mrs. Algis Downs PA note.   Clint Lipps, MD [facility] Pager: [phone] 10/02/2013, 2:03 PM

## 2013-10-02 NOTE — Progress Notes (Signed)
Report given to Ander Slade, RN at [facility] in [facility] 10/02/13 at 1650.   Luz Brazen, RN

## 2013-10-02 NOTE — Progress Notes (Signed)
Patient ID: [name] Colon, female   DOB: October 17, 1920, 79 y.o.   MRN: [id number]   Subjective: Reports having a bm this morning.  Tolerating diet.  Denies n/v abdominal pain.   Objective:  Vital signs:  Filed Vitals:   09/30/13 2028 10/01/13 0605 10/01/13 2127 10/02/13 0502  BP: 172/71 177/98 152/72 157/77  Pulse: 69 68 73 64  Temp: 98.1 F (36.7 C) 98 F (36.7 C) 97.9 F (36.6 C) 98.2 F (36.8 C)  TempSrc: Oral Oral Oral Oral  Resp: 18 18 17 17   Height:      Weight:      SpO2: 91% 96% 94% 95%    Last BM Date: 10/01/13  Intake/Output   Yesterday:  11/16 0701 - 11/17 0700 In: 240 [P.O.:240] Out: 2500 [Urine:2500] This shift:     Physical Exam:  General: Pt awake/alert/oriented x3 in no acute distress Abdomen: Soft.  Nondistended.  Non tender.  Midline scar.  No evidence of peritonitis.  No incarcerated hernias.   Problem List:   Active Problems:   Type 2 diabetes mellitus   SIRS (systemic inflammatory response syndrome)   Thrombocytopenia   Hypothyroidism   Leukopenia   Poor venous access   HTN (hypertension)   ?? Wide-complex tachycardia   Dehydration with hyponatremia   Bowel obstruction    Results:   Labs: Results for orders placed during the hospital encounter of 09/24/13 (from the past 48 hour(s))  GLUCOSE, CAPILLARY     Status: Abnormal   Collection Time    09/30/13 11:46 AM      Result Value Range   Glucose-Capillary 182 (*) 70 - 99 mg/dL  GLUCOSE, CAPILLARY     Status: Abnormal   Collection Time    09/30/13  4:33 PM      Result Value Range   Glucose-Capillary 173 (*) 70 - 99 mg/dL  GLUCOSE, CAPILLARY     Status: None   Collection Time    09/30/13  9:01 PM      Result Value Range   Glucose-Capillary 80  70 - 99 mg/dL   Comment 1 Notify RN    CBC     Status: Abnormal   Collection Time    10/01/13  4:46 AM      Result Value Range   WBC 4.0  4.0 - 10.5 K/uL   RBC 3.75 (*) 3.87 - 5.11 MIL/uL   Hemoglobin 12.0  12.0 - 15.0 g/dL   HCT 78.2 (*) 95.6 - 21.3 %   MCV 92.3  78.0 - 100.0 fL   MCH 32.0  26.0 - 34.0 pg   MCHC 34.7  30.0 - 36.0 g/dL   RDW 08.6  57.8 - 46.9 %   Platelets 230  150 - 400 K/uL  BASIC METABOLIC PANEL     Status: Abnormal   Collection Time    10/01/13  4:46 AM      Result Value Range   Sodium 139  135 - 145 mEq/L   Potassium 4.0  3.5 - 5.1 mEq/L   Chloride 108  96 - 112 mEq/L   CO2 25  19 - 32 mEq/L   Glucose, Bld 106 (*) 70 - 99 mg/dL   BUN 4 (*) 6 - 23 mg/dL   Creatinine, Ser 6.29 (*) 0.50 - 1.10 mg/dL   Calcium 8.6  8.4 - 52.8 mg/dL   GFR calc non Af Amer 83 (*) >90 mL/min   GFR calc Af Amer >90  >90 mL/min   Comment: (  NOTE)     The eGFR has been calculated using the CKD EPI equation.     This calculation has not been validated in all clinical situations.     eGFR's persistently <90 mL/min signify possible Chronic Kidney     Disease.  GLUCOSE, CAPILLARY     Status: Abnormal   Collection Time    10/01/13  7:23 AM      Result Value Range   Glucose-Capillary 111 (*) 70 - 99 mg/dL  GLUCOSE, CAPILLARY     Status: Abnormal   Collection Time    10/01/13 11:39 AM      Result Value Range   Glucose-Capillary 154 (*) 70 - 99 mg/dL  GLUCOSE, CAPILLARY     Status: Abnormal   Collection Time    10/01/13  5:01 PM      Result Value Range   Glucose-Capillary 164 (*) 70 - 99 mg/dL   Comment 1 Documented in Chart     Comment 2 Notify RN    GLUCOSE, CAPILLARY     Status: Abnormal   Collection Time    10/02/13  7:45 AM      Result Value Range   Glucose-Capillary 104 (*) 70 - 99 mg/dL   Comment 1 Documented in Chart     Comment 2 Notify RN      Imaging / Studies: No results found.  Medications / Allergies: per chart  Antibiotics: Anti-infectives   Start     Dose/Rate Route Frequency Ordered Stop   09/26/13 2000  levofloxacin (LEVAQUIN) IVPB 750 mg  Status:  Discontinued     750 mg 100 mL/hr over 90 Minutes Intravenous Every 48 hours 09/26/13 1751 09/29/13 1406   09/26/13 0000   vancomycin (VANCOCIN) IVPB 750 mg/150 ml premix  Status:  Discontinued     750 mg 150 mL/hr over 60 Minutes Intravenous Every 24 hours 09/25/13 0118 09/26/13 1432   09/25/13 2200  cefTRIAXone (ROCEPHIN) 2 g in dextrose 5 % 50 mL IVPB  Status:  Discontinued     2 g 100 mL/hr over 30 Minutes Intravenous Every 24 hours 09/24/13 2147 09/24/13 2219   09/25/13 2200  cefTRIAXone (ROCEPHIN) 1 g in dextrose 5 % 50 mL IVPB  Status:  Discontinued     1 g 100 mL/hr over 30 Minutes Intravenous Every 24 hours 09/24/13 2219 09/25/13 0118   09/25/13 0130  vancomycin (VANCOCIN) IVPB 750 mg/150 ml premix     750 mg 150 mL/hr over 60 Minutes Intravenous  Once 09/25/13 0118 09/25/13 0243   09/25/13 0130  imipenem-cilastatin (PRIMAXIN) 250 mg in sodium chloride 0.9 % 100 mL IVPB  Status:  Discontinued     250 mg 200 mL/hr over 30 Minutes Intravenous 3 times per day 09/25/13 0119 09/26/13 1432   09/24/13 2200  cefTRIAXone (ROCEPHIN) 2 g in dextrose 5 % 50 mL IVPB     2 g 100 mL/hr over 30 Minutes Intravenous  Once 09/24/13 2145 09/24/13 2233      Assessment/Plan SBO -resolved.  Recommend miralax, adequate fluid hydration, mobility.  -stable for discharge from surgical standpoint  Ashok Norris, [facility] Pager [phone] Office [phone]  10/02/2013 9:39 AM

## 2013-10-02 NOTE — Progress Notes (Signed)
Gave packet to daughter Leotis Shames.  Family wants to take pt. To facility.  Printed and discussed AVS information and printed packet on constipation.  Reviewed medication list with daughter Leotis Shames.  Family wants pt. To take a rest before leaving.  Told to call when ready. Luz Brazen, RN

## 2013-10-02 NOTE — Clinical Social Work Placement (Signed)
Clinical Social Work Department CLINICAL SOCIAL WORK PLACEMENT NOTE 10/02/2013  Patient:  EDDYE, BROXTERMAN  Account Number:  [id number] Admit date:  09/24/2013  Clinical Social Worker:  Cherre Blanc, [place]  Date/time:  10/02/2013 01:15 PM  Clinical Social Work is seeking post-discharge placement for this patient at the following level of care:   SKILLED NURSING   (*CSW will update this form in Epic as items are completed)   10/02/2013  Patient/family provided with [facility] Department of Clinical Social Work's list of facilities offering this level of care within the geographic area requested by the patient (or if unable, by the patient's family).  10/02/2013  Patient/family informed of their freedom to choose among providers that offer the needed level of care, that participate in Medicare, Medicaid or managed care program needed by the patient, have an available bed and are willing to accept the patient.  10/02/2013  Patient/family informed of [facility]' ownership interest in [facility], as well as of the fact that they are under no obligation to receive care at this facility.  PASARR submitted to EDS on 10/02/2013 PASARR number received from [employer]DS on 10/02/2013  FL2 transmitted to all facilities in geographic area requested by pt/family on  10/02/2013 FL2 transmitted to all facilities within larger geographic area on   Patient informed that his/her managed care company has contracts with or will negotiate with  certain facilities, including the following:     Patient/family informed of bed offers received:  10/02/2013 Patient chooses bed at OTHER Physician recommends and patient chooses bed at    Patient to be transferred to OTHER on  10/02/2013 Patient to be transferred to facility by Daughter  The following physician request were entered in Epic:   Additional Comments: Per MD patient ready to DC to [facility] in [facility] on 10/02/13.  Patient, RN, daughter,and facility notified of DC. RN given number for report. DC packet left with patient's chart. Daughter unsure of whether they will transport daughter themselves or request ambulance transport. Evening ambulance number given to RN. CSW signing off.   Roddie Mc, [place], [place], [phone]

## 2013-10-03 NOTE — Care Management Note (Signed)
    Page 1 of 1   10/03/2013     3:29:11 PM   CARE MANAGEMENT NOTE 10/03/2013  Patient:  [name] Colon, [name] Colon   Account Number:  [id number]  Date Initiated:  09/28/2013  Documentation initiated by:  Letha Cape  Subjective/Objective Assessment:   dx sbo  admit- lives lives with son.     Action/Plan:   pt eval- recs snf   Anticipated DC Date:  10/02/2013   Anticipated DC Plan:  SKILLED NURSING FACILITY  In-house referral  Clinical Social Worker      DC Planning Services  CM consult      Choice offered to / List presented to:             Status of service:  Completed, signed off Medicare Important Message given?   (If response is "NO", the following Medicare IM given date fields will be blank) Date Medicare IM given:   Date Additional Medicare IM given:    Discharge Disposition:  SKILLED NURSING FACILITY  Per UR Regulation:  Reviewed for med. necessity/level of care/duration of stay  If discussed at Long Length of Stay Meetings, dates discussed:    Comments:  10/03/13 15:27 Letha Cape RN, BSN [phone] patient lives with with son, per physical therapy recs snf, CSW following, pat dc to snf at dc.

## 2013-11-30 ENCOUNTER — Ambulatory Visit: Payer: Self-pay | Admitting: Podiatry

## 2013-11-30 ENCOUNTER — Ambulatory Visit (INDEPENDENT_AMBULATORY_CARE_PROVIDER_SITE_OTHER): Payer: Medicare Other | Admitting: Podiatry

## 2013-11-30 ENCOUNTER — Encounter: Payer: Self-pay | Admitting: Podiatry

## 2013-11-30 VITALS — BP 158/76 | HR 74 | Resp 16 | Ht 60.0 in | Wt 132.0 lb

## 2013-11-30 DIAGNOSIS — M2040 Other hammer toe(s) (acquired), unspecified foot: Secondary | ICD-10-CM

## 2013-11-30 DIAGNOSIS — M898X9 Other specified disorders of bone, unspecified site: Secondary | ICD-10-CM

## 2013-11-30 DIAGNOSIS — M779 Enthesopathy, unspecified: Secondary | ICD-10-CM

## 2013-11-30 MED ORDER — TRIAMCINOLONE ACETONIDE 10 MG/ML IJ SUSP: 10.0000 mg | Freq: Once | INTRAMUSCULAR | Status: DC

## 2013-11-30 NOTE — Progress Notes (Signed)
Subjective:     Patient ID: [name] Colon, female   DOB: 09/04/1920, 80 y.o.   MRN: [id number]  HPI patient presents with a abnormal fifth toe right pressing against the fourth toe creating inflammation between the 2 toes and fluid buildup on the fourth toe that it's painful   Review of Systems     Objective:   Physical Exam Neurovascular status unchanged with rotated fifth toe creating inflammation and pain of the interphalangeal joint fourth toe right foot    Assessment:     Capsulitis right fourth toe with interphalangeal pain    Plan:     Careful injection of the interphalangeal joint fourth toe right foot 2 mg dexamethasone 2 mg Xylocaine and applied padding to reduce pressure against the surface

## 2014-03-12 ENCOUNTER — Emergency Department (HOSPITAL_COMMUNITY): Payer: Medicare Other

## 2014-03-12 ENCOUNTER — Encounter (HOSPITAL_COMMUNITY): Payer: Self-pay | Admitting: Emergency Medicine

## 2014-03-12 ENCOUNTER — Emergency Department (HOSPITAL_COMMUNITY)
Admission: EM | Admit: 2014-03-12 | Discharge: 2014-03-12 | Disposition: A | Discharge status: Home / Self Care | Payer: Medicare Other | Attending: Emergency Medicine | Admitting: Emergency Medicine

## 2014-03-12 DIAGNOSIS — K5900 Constipation, unspecified: Secondary | ICD-10-CM | POA: Insufficient documentation

## 2014-03-12 DIAGNOSIS — E079 Disorder of thyroid, unspecified: Secondary | ICD-10-CM | POA: Insufficient documentation

## 2014-03-12 DIAGNOSIS — Z79899 Other long term (current) drug therapy: Secondary | ICD-10-CM | POA: Insufficient documentation

## 2014-03-12 DIAGNOSIS — Z880 Allergy status to penicillin: Secondary | ICD-10-CM | POA: Insufficient documentation

## 2014-03-12 DIAGNOSIS — I10 Essential (primary) hypertension: Secondary | ICD-10-CM | POA: Insufficient documentation

## 2014-03-12 DIAGNOSIS — E119 Type 2 diabetes mellitus without complications: Secondary | ICD-10-CM | POA: Insufficient documentation

## 2014-03-12 DIAGNOSIS — R109 Unspecified abdominal pain: Secondary | ICD-10-CM | POA: Insufficient documentation

## 2014-03-12 LAB — CBC WITH DIFFERENTIAL/PLATELET
BASOS PCT: 0 % (ref 0–1)
Basophils Absolute: 0 10*3/uL (ref 0.0–0.1)
EOS ABS: 0.1 10*3/uL (ref 0.0–0.7)
EOS PCT: 3 % (ref 0–5)
HEMATOCRIT: 39.1 % (ref 36.0–46.0)
Hemoglobin: 13.4 g/dL (ref 12.0–15.0)
Lymphocytes Relative: 47 % — ABNORMAL HIGH (ref 12–46)
Lymphs Abs: 2.1 10*3/uL (ref 0.7–4.0)
MCH: 31.2 pg (ref 26.0–34.0)
MCHC: 34.3 g/dL (ref 30.0–36.0)
MCV: 90.9 fL (ref 78.0–100.0)
MONO ABS: 0.5 10*3/uL (ref 0.1–1.0)
MONOS PCT: 11 % (ref 3–12)
Neutro Abs: 1.7 10*3/uL (ref 1.7–7.7)
Neutrophils Relative %: 39 % — ABNORMAL LOW (ref 43–77)
Platelets: 201 10*3/uL (ref 150–400)
RBC: 4.3 MIL/uL (ref 3.87–5.11)
RDW: 13.5 % (ref 11.5–15.5)
WBC: 4.5 10*3/uL (ref 4.0–10.5)

## 2014-03-12 LAB — COMPREHENSIVE METABOLIC PANEL
ALBUMIN: 3.6 g/dL (ref 3.5–5.2)
ALT: 13 U/L (ref 0–35)
AST: 18 U/L (ref 0–37)
Alkaline Phosphatase: 85 U/L (ref 39–117)
BUN: 13 mg/dL (ref 6–23)
CO2: 29 mEq/L (ref 19–32)
CREATININE: 0.76 mg/dL (ref 0.50–1.10)
Calcium: 10 mg/dL (ref 8.4–10.5)
Chloride: 102 mEq/L (ref 96–112)
GFR calc non Af Amer: 72 mL/min — ABNORMAL LOW (ref >90)
GFR, EST AFRICAN AMERICAN: 83 mL/min — AB (ref >90)
GLUCOSE: 110 mg/dL — AB (ref 70–99)
Potassium: 4 mEq/L (ref 3.7–5.3)
Sodium: 142 mEq/L (ref 137–147)
TOTAL PROTEIN: 7.3 g/dL (ref 6.0–8.3)
Total Bilirubin: 0.2 mg/dL — ABNORMAL LOW (ref 0.3–1.2)

## 2014-03-12 LAB — URINALYSIS, ROUTINE W REFLEX MICROSCOPIC
Bilirubin Urine: NEGATIVE
GLUCOSE, UA: NEGATIVE mg/dL
Hgb urine dipstick: NEGATIVE
KETONES UR: NEGATIVE mg/dL
LEUKOCYTES UA: NEGATIVE
NITRITE: NEGATIVE
Protein, ur: NEGATIVE mg/dL
Specific Gravity, Urine: 1.008 (ref 1.005–1.030)
Urobilinogen, UA: 0.2 mg/dL (ref 0.0–1.0)
pH: 7.5 (ref 5.0–8.0)

## 2014-03-12 MED ORDER — IOHEXOL 300 MG/ML  SOLN
80.0000 mL | Freq: Once | INTRAMUSCULAR | Status: AC | PRN
  Administered 2014-03-12: 80 mL via INTRAVENOUS

## 2014-03-12 MED ORDER — ONDANSETRON HCL 4 MG/2ML IJ SOLN
4.0000 mg | Freq: Once | INTRAMUSCULAR | Status: AC
  Administered 2014-03-12: 4 mg via INTRAVENOUS
  Filled 2014-03-12: qty 2

## 2014-03-12 MED ORDER — MORPHINE SULFATE 4 MG/ML IJ SOLN
4.0000 mg | Freq: Once | INTRAMUSCULAR | Status: AC
  Administered 2014-03-12: 4 mg via INTRAVENOUS
  Filled 2014-03-12: qty 1

## 2014-03-12 MED ORDER — IOHEXOL 300 MG/ML  SOLN
50.0000 mL | Freq: Once | INTRAMUSCULAR | Status: AC | PRN
  Administered 2014-03-12: 50 mL via ORAL

## 2014-03-12 NOTE — ED Notes (Signed)
MD at bedside. 

## 2014-03-12 NOTE — Discharge Instructions (Signed)
Abdominal Pain, Adult °Many things can cause abdominal pain. Usually, abdominal pain is not caused by a disease and will improve without treatment. It can often be observed and treated at home. Your health care provider will do a physical exam and possibly order blood tests and X-rays to help determine the seriousness of your pain. However, in many cases, more time must pass before a clear cause of the pain can be found. Before that point, your health care provider may not know if you need more testing or further treatment. °HOME CARE INSTRUCTIONS  °Monitor your abdominal pain for any changes. The following actions may help to alleviate any discomfort you are experiencing: °· Only take over-the-counter or prescription medicines as directed by your health care provider. °· Do not take laxatives unless directed to do so by your health care provider. °· Try a clear liquid diet (broth, tea, or water) as directed by your health care provider. Slowly move to a bland diet as tolerated. °SEEK MEDICAL CARE IF: °· You have unexplained abdominal pain. °· You have abdominal pain associated with nausea or diarrhea. °· You have pain when you urinate or have a bowel movement. °· You experience abdominal pain that wakes you in the night. °· You have abdominal pain that is worsened or improved by eating food. °· You have abdominal pain that is worsened with eating fatty foods. °SEEK IMMEDIATE MEDICAL CARE IF:  °· Your pain does not go away within 2 hours. °· You have a fever. °· You keep throwing up (vomiting). °· Your pain is felt only in portions of the abdomen, such as the right side or the left lower portion of the abdomen. °· You pass bloody or black tarry stools. °MAKE SURE YOU: °· Understand these instructions.   °· Will watch your condition.   °· Will get help right away if you are not doing well or get worse.   °Document Released: 08/12/2005 Document Revised: 08/23/2013 Document Reviewed: 07/12/2013 °[employer]® Patient  Information ©2014 [employer]. ° °

## 2014-03-12 NOTE — ED Notes (Signed)
MD at bedside. EDP Pickering in to see pt

## 2014-03-12 NOTE — ED Notes (Signed)
Family at bedside. 

## 2014-03-12 NOTE — ED Notes (Signed)
Per pt, left flank/absominal pain since last noght, no dysuria, some nausea

## 2014-03-12 NOTE — ED Provider Notes (Signed)
CSN: [id number]     Arrival date & time 03/12/14  1215 History   First MD Initiated Contact with Patient 03/12/14 1256     Chief Complaint  Patient presents with  . left flank pain      (Consider location/radiation/quality/duration/timing/severity/associated sxs/prior Treatment) The history is provided by the patient.   patient has pain in her left abdomen and flank. Again around 1 in the morning today, 13 hours ago. No fevers. She states there is a dull pain is constant and sharp and it comes and goes. A sharp pain lasts around 30 seconds. She cannot make the pain come on. No nausea right. No diarrhea. Constipation. No dysuria. No fevers. She's not had pains like this previously. She has not felt her heart racing.  Past Medical History  Diagnosis Date  . Hypertension   . Diabetes mellitus   . Thyroid disease   . Claudication    Past Surgical History  Procedure Laterality Date  . Appendectomy    . Tonsillectomy     Family History  Problem Relation Age of Onset  . Heart failure Mother   . Lung disease Father     Black Lung disease   History  Substance Use Topics  . Smoking status: Never Smoker   . Smokeless tobacco: Not on file  . Alcohol Use: No   OB History   Grav Para Term Preterm Abortions TAB SAB Ect Mult Living                 Review of Systems  Constitutional: Negative for activity change and appetite change.  Eyes: Negative for pain.  Respiratory: Negative for chest tightness and shortness of breath.   Cardiovascular: Negative for chest pain and leg swelling.  Gastrointestinal: Positive for abdominal pain. Negative for nausea, vomiting and diarrhea.  Genitourinary: Positive for flank pain.  Musculoskeletal: Negative for back pain and neck stiffness.  Skin: Negative for rash.  Neurological: Negative for weakness, numbness and headaches.  Psychiatric/Behavioral: Negative for behavioral problems.      Allergies  Doxycycline; Aspirin; and  Penicillins  Home Medications   Prior to Admission medications   Medication Sig Start Date End Date Taking? Authorizing Provider  calcium-vitamin D (OSCAL WITH D) 500-200 MG-UNIT per tablet Take 1 tablet by mouth 2 (two) times daily.   Yes Historical Provider, MD  Cholecalciferol 2000 UNITS CAPS Take 2,000 Units by mouth daily.   Yes Historical Provider, MD  hydrOXYzine (VISTARIL) 25 MG capsule Take 25 mg by mouth 3 (three) times daily as needed for itching.   Yes Historical Provider, MD  levothyroxine (SYNTHROID, LEVOTHROID) 75 MCG tablet Take 75 mcg by mouth daily before breakfast.   Yes Historical Provider, MD  lisinopril-hydrochlorothiazide (PRINZIDE,ZESTORETIC) 10-12.5 MG per tablet Take 1 tablet by mouth daily.   Yes Historical Provider, MD  loratadine (CLARITIN) 10 MG tablet Take 10 mg by mouth daily.   Yes Historical Provider, MD  primidone (MYSOLINE) 250 MG tablet Take 250 mg by mouth at bedtime.    Yes Historical Provider, MD   BP 176/53  Pulse 66  Temp(Src) 97.7 F (36.5 C) (Oral)  Resp 18  SpO2 98% Physical Exam  Nursing note and vitals reviewed. Constitutional: She is oriented to person, place, and time. She appears well-developed and well-nourished.  HENT:  Head: Normocephalic and atraumatic.  Eyes: EOM are normal. Pupils are equal, round, and reactive to light.  Neck: Normal range of motion. Neck supple.  Cardiovascular: Normal rate, regular rhythm and normal  heart sounds.   No murmur heard. Pulmonary/Chest: Effort normal and breath sounds normal. No respiratory distress. She has no wheezes. She has no rales.  Abdominal: Soft. Bowel sounds are normal. She exhibits no distension. There is tenderness. There is no rebound and no guarding.  Tenderness to right lower abdomen. Possible fullness. No rebound or guarding. No hernias.  Musculoskeletal: Normal range of motion.  Neurological: She is alert and oriented to person, place, and time. No cranial nerve deficit.  Skin:  Skin is warm and dry.  Psychiatric: She has a normal mood and affect. Her speech is normal.    ED Course  Procedures (including critical care time) Labs Review Labs Reviewed  CBC WITH DIFFERENTIAL - Abnormal; Notable for the following:    Neutrophils Relative % 39 (*)    Lymphocytes Relative 47 (*)    All other components within normal limits  COMPREHENSIVE METABOLIC PANEL - Abnormal; Notable for the following:    Glucose, Bld 110 (*)    Total Bilirubin 0.2 (*)    GFR calc non Af Amer 72 (*)    GFR calc Af Amer 83 (*)    All other components within normal limits  URINALYSIS, ROUTINE W REFLEX MICROSCOPIC    Imaging Review No results found.   EKG Interpretation   Date/Time:  Monday March 12 2014 13:21:39 EDT Ventricular Rate:  66 PR Interval:  134 QRS Duration: 83 QT Interval:  402 QTC Calculation: 421 R Axis:   -25 Text Interpretation:  Sinus rhythm Borderline left axis deviation Low  voltage, extremity and precordial leads Baseline wander in lead(s) V6  Confirmed by Rubin PayorPICKERING  MD, Harrold DonathNATHAN [phone]([id number]) on 03/12/2014 1:42:07 PM      MDM   Final diagnoses:  None    Patient with left abdominal pain. Does have some tenderness. Is also tender on the right abdomen on reexamination. Lab work overall reassuring. CT scan is pending. Initial heart rate was elevated, however on recheck it was not high it has not been high since. He.    Juliet RudeNathan R. Rubin PayorPickering, MD 03/12/14 1531

## 2014-03-12 NOTE — ED Notes (Signed)
Patient transported to CT 

## 2014-03-12 NOTE — ED Provider Notes (Signed)
Assumed care from Dr Rubin PayorPickering in sign out. W/u pretty unremarkable. CT w/o acute abnormality. On my exam pt is very well appearing for her age. My abdominal exam is benign. I'm not sure what to make of initial tachycardia. With me HR and every recheck normal. Will tx symptomatically at this time. Return precautions discussed.   Raeford RazorStephen Milyn Stapleton, MD 03/15/14 [phone]

## 2014-03-21 ENCOUNTER — Encounter: Payer: Self-pay | Admitting: Podiatry

## 2014-03-21 ENCOUNTER — Ambulatory Visit (INDEPENDENT_AMBULATORY_CARE_PROVIDER_SITE_OTHER): Payer: Medicare Other | Admitting: Podiatry

## 2014-03-21 VITALS — BP 143/61 | HR 82 | Resp 16

## 2014-03-21 DIAGNOSIS — L259 Unspecified contact dermatitis, unspecified cause: Secondary | ICD-10-CM

## 2014-03-21 MED ORDER — NAFTIFINE HCL 2 % EX CREA: 1.0000 [drp] | TOPICAL_CREAM | CUTANEOUS | Status: DC

## 2014-03-22 NOTE — Progress Notes (Signed)
Subjective:     Patient ID: [name] DrapeElizabeth M Uher, female   DOB: 02/25/1920, 80 y.o.   MRN: [id number]  HPI patient states have some pain in my right foot and aching in my left foot   Review of Systems     Objective:   Physical Exam Neurovascular status is found to be unchanged with patient well oriented x3 and has diminished muscle strength and range of motion of the subtalar and midtarsal joint. Moderate discomfort dorsum right foot diffuse in nature and discomfort left foot with redness and itching left foot    Assessment:     Tendinitis right foot with inflammation and probable neurological issues along with inflammation left foot and dermatitis    Plan:     Discussed all conditions and advised on anti-inflammatories physical therapy and supportive shoe gear usage. If symptoms persist reappoint

## 2014-06-12 ENCOUNTER — Other Ambulatory Visit: Payer: Self-pay | Admitting: Family Medicine

## 2014-06-12 DIAGNOSIS — M810 Age-related osteoporosis without current pathological fracture: Secondary | ICD-10-CM

## 2014-06-21 ENCOUNTER — Ambulatory Visit
Admission: RE | Admit: 2014-06-21 | Discharge: 2014-06-21 | Disposition: A | Discharge status: Home / Self Care | Payer: Medicare Other | Source: Ambulatory Visit | Attending: Family Medicine | Admitting: Family Medicine

## 2014-06-21 DIAGNOSIS — M810 Age-related osteoporosis without current pathological fracture: Secondary | ICD-10-CM

## 2015-03-04 ENCOUNTER — Telehealth (HOSPITAL_COMMUNITY): Payer: Self-pay | Admitting: *Deleted

## 2015-03-05 ENCOUNTER — Other Ambulatory Visit (HOSPITAL_COMMUNITY): Payer: Self-pay | Admitting: Family Medicine

## 2015-03-05 DIAGNOSIS — I739 Peripheral vascular disease, unspecified: Secondary | ICD-10-CM

## 2015-03-13 ENCOUNTER — Ambulatory Visit (HOSPITAL_COMMUNITY)
Admission: RE | Admit: 2015-03-13 | Discharge: 2015-03-13 | Disposition: A | Discharge status: Home / Self Care | Payer: Medicare Other | Source: Ambulatory Visit | Attending: Cardiology | Admitting: Cardiology

## 2015-03-13 DIAGNOSIS — I739 Peripheral vascular disease, unspecified: Secondary | ICD-10-CM | POA: Insufficient documentation

## 2015-03-13 NOTE — Progress Notes (Signed)
Lower extremity arterial duplex completed. Known bilateral superficial femoral artery occlusions appear unchanged since 2014. Brianna L Mazza,RVT

## 2015-03-19 ENCOUNTER — Telehealth: Payer: Self-pay | Admitting: *Deleted

## 2015-03-19 NOTE — Telephone Encounter (Signed)
I left a message for patient to contact me.  She had lower extremity dopplers done here that were abnormal.  She wanted to talk with Dr Mardelle MatteAndy and then decide if she wants to see Dr Allyson SabalBerry.  I was just calling to follow up on that.

## 2015-03-21 NOTE — Telephone Encounter (Signed)
I spoke with patient.  She is adamant that she wants to follow up with her PCP and not us.

## 2015-03-22 ENCOUNTER — Telehealth (HOSPITAL_COMMUNITY): Payer: Self-pay | Admitting: *Deleted

## 2015-06-04 ENCOUNTER — Telehealth (HOSPITAL_COMMUNITY): Payer: Self-pay | Admitting: *Deleted

## 2015-06-11 ENCOUNTER — Telehealth (HOSPITAL_COMMUNITY): Payer: Self-pay | Admitting: *Deleted

## 2015-06-14 ENCOUNTER — Other Ambulatory Visit (HOSPITAL_COMMUNITY): Payer: Self-pay | Admitting: Family Medicine

## 2015-06-14 DIAGNOSIS — R600 Localized edema: Secondary | ICD-10-CM

## 2015-06-18 ENCOUNTER — Other Ambulatory Visit: Payer: Self-pay

## 2015-06-18 ENCOUNTER — Ambulatory Visit (HOSPITAL_COMMUNITY): Payer: Medicare Other | Attending: Cardiology

## 2015-06-18 DIAGNOSIS — I351 Nonrheumatic aortic (valve) insufficiency: Secondary | ICD-10-CM | POA: Diagnosis not present

## 2015-06-18 DIAGNOSIS — I358 Other nonrheumatic aortic valve disorders: Secondary | ICD-10-CM | POA: Diagnosis not present

## 2015-06-18 DIAGNOSIS — R609 Edema, unspecified: Secondary | ICD-10-CM | POA: Diagnosis present

## 2015-06-18 DIAGNOSIS — I071 Rheumatic tricuspid insufficiency: Secondary | ICD-10-CM | POA: Insufficient documentation

## 2015-06-18 DIAGNOSIS — I059 Rheumatic mitral valve disease, unspecified: Secondary | ICD-10-CM | POA: Diagnosis not present

## 2015-06-18 DIAGNOSIS — R600 Localized edema: Secondary | ICD-10-CM

## 2016-03-05 ENCOUNTER — Emergency Department (HOSPITAL_COMMUNITY): Payer: Medicare Other

## 2016-03-05 ENCOUNTER — Inpatient Hospital Stay (HOSPITAL_COMMUNITY)
Admission: EM | Admit: 2016-03-05 | Discharge: 2016-03-10 | DRG: 082 | Disposition: A | Discharge status: Rehabilitation Facility | Payer: Medicare Other | Attending: Neurology | Admitting: Neurology

## 2016-03-05 ENCOUNTER — Encounter (HOSPITAL_COMMUNITY): Payer: Self-pay | Admitting: *Deleted

## 2016-03-05 DIAGNOSIS — I6381 Other cerebral infarction due to occlusion or stenosis of small artery: Secondary | ICD-10-CM | POA: Insufficient documentation

## 2016-03-05 DIAGNOSIS — S069X0S Unspecified intracranial injury without loss of consciousness, sequela: Secondary | ICD-10-CM

## 2016-03-05 DIAGNOSIS — M25512 Pain in left shoulder: Secondary | ICD-10-CM

## 2016-03-05 DIAGNOSIS — Z79899 Other long term (current) drug therapy: Secondary | ICD-10-CM

## 2016-03-05 DIAGNOSIS — M25552 Pain in left hip: Secondary | ICD-10-CM

## 2016-03-05 DIAGNOSIS — S065X9A Traumatic subdural hemorrhage with loss of consciousness of unspecified duration, initial encounter: Secondary | ICD-10-CM | POA: Diagnosis not present

## 2016-03-05 DIAGNOSIS — M25551 Pain in right hip: Secondary | ICD-10-CM

## 2016-03-05 DIAGNOSIS — I6330 Cerebral infarction due to thrombosis of unspecified cerebral artery: Secondary | ICD-10-CM | POA: Insufficient documentation

## 2016-03-05 DIAGNOSIS — Z886 Allergy status to analgesic agent status: Secondary | ICD-10-CM

## 2016-03-05 DIAGNOSIS — E785 Hyperlipidemia, unspecified: Secondary | ICD-10-CM | POA: Diagnosis present

## 2016-03-05 DIAGNOSIS — Z881 Allergy status to other antibiotic agents status: Secondary | ICD-10-CM

## 2016-03-05 DIAGNOSIS — W19XXXA Unspecified fall, initial encounter: Secondary | ICD-10-CM | POA: Diagnosis present

## 2016-03-05 DIAGNOSIS — I272 Other secondary pulmonary hypertension: Secondary | ICD-10-CM | POA: Diagnosis present

## 2016-03-05 DIAGNOSIS — I169 Hypertensive crisis, unspecified: Secondary | ICD-10-CM | POA: Diagnosis present

## 2016-03-05 DIAGNOSIS — R29709 NIHSS score 9: Secondary | ICD-10-CM | POA: Diagnosis not present

## 2016-03-05 DIAGNOSIS — I63512 Cerebral infarction due to unspecified occlusion or stenosis of left middle cerebral artery: Secondary | ICD-10-CM | POA: Diagnosis not present

## 2016-03-05 DIAGNOSIS — I161 Hypertensive emergency: Secondary | ICD-10-CM | POA: Diagnosis present

## 2016-03-05 DIAGNOSIS — R4781 Slurred speech: Secondary | ICD-10-CM

## 2016-03-05 DIAGNOSIS — I639 Cerebral infarction, unspecified: Secondary | ICD-10-CM

## 2016-03-05 DIAGNOSIS — S069X9A Unspecified intracranial injury with loss of consciousness of unspecified duration, initial encounter: Secondary | ICD-10-CM | POA: Insufficient documentation

## 2016-03-05 DIAGNOSIS — M25511 Pain in right shoulder: Secondary | ICD-10-CM

## 2016-03-05 DIAGNOSIS — I5032 Chronic diastolic (congestive) heart failure: Secondary | ICD-10-CM | POA: Insufficient documentation

## 2016-03-05 DIAGNOSIS — S065XAA Traumatic subdural hemorrhage with loss of consciousness status unknown, initial encounter: Secondary | ICD-10-CM | POA: Diagnosis present

## 2016-03-05 DIAGNOSIS — S161XXA Strain of muscle, fascia and tendon at neck level, initial encounter: Secondary | ICD-10-CM

## 2016-03-05 DIAGNOSIS — R4701 Aphasia: Secondary | ICD-10-CM | POA: Diagnosis not present

## 2016-03-05 DIAGNOSIS — E039 Hypothyroidism, unspecified: Secondary | ICD-10-CM | POA: Insufficient documentation

## 2016-03-05 DIAGNOSIS — E119 Type 2 diabetes mellitus without complications: Secondary | ICD-10-CM | POA: Diagnosis present

## 2016-03-05 DIAGNOSIS — Z880 Allergy status to penicillin: Secondary | ICD-10-CM

## 2016-03-05 DIAGNOSIS — Z8249 Family history of ischemic heart disease and other diseases of the circulatory system: Secondary | ICD-10-CM

## 2016-03-05 DIAGNOSIS — S069XAA Unspecified intracranial injury with loss of consciousness status unknown, initial encounter: Secondary | ICD-10-CM | POA: Insufficient documentation

## 2016-03-05 DIAGNOSIS — I672 Cerebral atherosclerosis: Secondary | ICD-10-CM | POA: Diagnosis present

## 2016-03-05 DIAGNOSIS — G8191 Hemiplegia, unspecified affecting right dominant side: Secondary | ICD-10-CM | POA: Diagnosis not present

## 2016-03-05 DIAGNOSIS — S0990XA Unspecified injury of head, initial encounter: Secondary | ICD-10-CM

## 2016-03-05 DIAGNOSIS — I739 Peripheral vascular disease, unspecified: Secondary | ICD-10-CM | POA: Diagnosis present

## 2016-03-05 DIAGNOSIS — I6522 Occlusion and stenosis of left carotid artery: Secondary | ICD-10-CM | POA: Diagnosis present

## 2016-03-05 DIAGNOSIS — I619 Nontraumatic intracerebral hemorrhage, unspecified: Secondary | ICD-10-CM

## 2016-03-05 DIAGNOSIS — F068 Other specified mental disorders due to known physiological condition: Secondary | ICD-10-CM | POA: Insufficient documentation

## 2016-03-05 DIAGNOSIS — I10 Essential (primary) hypertension: Secondary | ICD-10-CM | POA: Diagnosis present

## 2016-03-05 LAB — I-STAT CHEM 8, ED
BUN: 19 mg/dL (ref 6–20)
CALCIUM ION: 1.1 mmol/L — AB (ref 1.13–1.30)
Chloride: 105 mmol/L (ref 101–111)
Creatinine, Ser: 0.7 mg/dL (ref 0.44–1.00)
GLUCOSE: 138 mg/dL — AB (ref 65–99)
HCT: 51 % — ABNORMAL HIGH (ref 36.0–46.0)
Hemoglobin: 17.3 g/dL — ABNORMAL HIGH (ref 12.0–15.0)
Potassium: 3.8 mmol/L (ref 3.5–5.1)
Sodium: 141 mmol/L (ref 135–145)
TCO2: 22 mmol/L (ref 0–100)

## 2016-03-05 MED ORDER — LABETALOL HCL 5 MG/ML IV SOLN
20.0000 mg | Freq: Once | INTRAVENOUS | Status: AC
  Administered 2016-03-06: 20 mg via INTRAVENOUS
  Filled 2016-03-05: qty 4

## 2016-03-05 NOTE — ED Provider Notes (Signed)
CSN: [id number]     Arrival date & time 03/05/16  2138 History   First MD Initiated Contact with Patient 03/05/16 2141     Chief Complaint  Patient presents with  . Fall     (Consider location/radiation/quality/duration/timing/severity/associated sxs/prior Treatment) HPI Comments: Patient is a 82 year old female with past medical history of hypertension, diabetes, and thyroid disease. She presents for evaluation of a fall. She was apparently in the kitchen preparing food when the son heard a thud. He returned and found her laying on the floor on her side. He reports her being "out" for approximately 10 minutes prior to coming around. Patient does not recall this episode. She also has some short-term memory does not recall the ambulance ride here.  Patient is a 82 y.o. female presenting with fall. The history is provided by the patient.  Fall This is a new problem. The current episode started less than 1 hour ago. The problem occurs constantly. The problem has not changed since onset.Pertinent negatives include no chest pain, no headaches and no shortness of breath. Nothing aggravates the symptoms. Nothing relieves the symptoms. She has tried nothing for the symptoms. The treatment provided no relief.    Past Medical History  Diagnosis Date  . Hypertension   . Diabetes mellitus   . Thyroid disease   . Claudication [facility](HCC)    Past Surgical History  Procedure Laterality Date  . Appendectomy    . Tonsillectomy     Family History  Problem Relation Age of Onset  . Heart failure Mother   . Lung disease Father     Black Lung disease   Social History  Substance Use Topics  . Smoking status: Never Smoker   . Smokeless tobacco: None  . Alcohol Use: No   OB History    No data available     Review of Systems  Respiratory: Negative for shortness of breath.   Cardiovascular: Negative for chest pain.  Neurological: Negative for headaches.  All other systems reviewed and are  negative.     Allergies  Doxycycline; Aspirin; and Penicillins  Home Medications   Prior to Admission medications   Medication Sig Start Date End Date Taking? Authorizing Provider  calcium-vitamin D (OSCAL WITH D) 500-200 MG-UNIT per tablet Take 1 tablet by mouth 2 (two) times daily.    Historical Provider, MD  Cholecalciferol 2000 UNITS CAPS Take 2,000 Units by mouth daily.    Historical Provider, MD  hydrOXYzine (VISTARIL) 25 MG capsule Take 25 mg by mouth 3 (three) times daily as needed for itching.    Historical Provider, MD  levothyroxine (SYNTHROID, LEVOTHROID) 75 MCG tablet Take 75 mcg by mouth daily before breakfast.    Historical Provider, MD  lisinopril-hydrochlorothiazide (PRINZIDE,ZESTORETIC) 10-12.5 MG per tablet Take 1 tablet by mouth daily.    Historical Provider, MD  loratadine (CLARITIN) 10 MG tablet Take 10 mg by mouth daily.    Historical Provider, MD  Naftifine HCl (NAFTIN) 2 % CREA Apply 1 drop topically 1 day or 1 dose. 03/21/14   Lenn SinkNorman S Regal, DPM  primidone (MYSOLINE) 250 MG tablet Take 250 mg by mouth at bedtime.     Historical Provider, MD   BP 261/84 mmHg  Pulse 73  Temp(Src) 98.4 F (36.9 C) (Oral)  Resp 21  Ht 5\' 5"  (1.651 m)  Wt 140 lb (63.504 kg)  BMI 23.30 kg/m2  SpO2 96% Physical Exam  Constitutional: She is oriented to person, place, and time. She appears well-developed and well-nourished.  No distress.  HENT:  Head: Normocephalic and atraumatic.  Eyes: EOM are normal. Pupils are equal, round, and reactive to light.  Neck: Normal range of motion. Neck supple.  Cardiovascular: Normal rate and regular rhythm.  Exam reveals no gallop and no friction rub.   No murmur heard. Pulmonary/Chest: Effort normal and breath sounds normal. No respiratory distress. She has no wheezes.  Abdominal: Soft. Bowel sounds are normal. She exhibits no distension. There is no tenderness.  Musculoskeletal: Normal range of motion. She exhibits no edema.  Neurological:  She is alert and oriented to person, place, and time. No cranial nerve deficit. She exhibits normal muscle tone. Coordination normal.  Skin: Skin is warm and dry. She is not diaphoretic.  Nursing note and vitals reviewed.   ED Course  Procedures (including critical care time) Labs Review Labs Reviewed  I-STAT CHEM 8, ED    Imaging Review No results found. I have personally reviewed and evaluated these images and lab results as part of my medical decision-making.   EKG Interpretation   Date/Time:  Thursday March 05 2016 21:57:35 EDT Ventricular Rate:  74 PR Interval:  176 QRS Duration: 67 QT Interval:  404 QTC Calculation: 448 R Axis:   0 Text Interpretation:  Sinus rhythm Confirmed by Anapaula Severt  MD, Aryan Bello ([id number])  on 03/05/2016 10:19:18 PM      MDM   Final diagnoses:  None    Patient presents for evaluation after a fall. She apparently lost her balance and fell to the floor striking her head. According to the son, she was unconscious for approximately 10 seconds before she came around. Her neurologic exam is nonfocal and head CT is negative. CT scan of the cervical spine is negative as well. The patient has been markedly hypertensive while in the emergency department with systolic blood pressures well over 250. She will be given an IV dose of labetalol and admitted to the hospitalist service for observation and regulation of her blood pressure. Admitting physician to be Dr. Antionette Char.    Geoffery Lyons, MD 03/06/16 [phone]

## 2016-03-05 NOTE — ED Notes (Addendum)
Per [facility]  Unwitnessed fall son heard a thud and found laying on her side. No blood thinners. Hematoma to right side of head in her hear. No IV access=.  C collar due to fall. 12 Lead unremarkable  Repetitive, doe snot remember conversations with EMS. AMS following the fall. No stroke like symptoms per EMS   Possible LOC "about 10 secs I didn't hear anything."

## 2016-03-05 NOTE — ED Notes (Addendum)
Pt says that she does not remember falling, cannot state circumstances prior to falling. Pt denies pain. Son states she was lying on her right side when he found her, states she was alert when he found her. She has been asking the same questions repetitively per him. Pt is hypertensive, states she has taken her medications today

## 2016-03-06 ENCOUNTER — Encounter (HOSPITAL_COMMUNITY): Payer: Self-pay | Admitting: Radiology

## 2016-03-06 ENCOUNTER — Inpatient Hospital Stay (HOSPITAL_COMMUNITY): Payer: Medicare Other

## 2016-03-06 ENCOUNTER — Observation Stay (HOSPITAL_COMMUNITY): Payer: Medicare Other

## 2016-03-06 ENCOUNTER — Other Ambulatory Visit (HOSPITAL_COMMUNITY): Payer: Medicare Other

## 2016-03-06 DIAGNOSIS — S069X2S Unspecified intracranial injury with loss of consciousness of 31 minutes to 59 minutes, sequela: Secondary | ICD-10-CM | POA: Diagnosis not present

## 2016-03-06 DIAGNOSIS — I272 Other secondary pulmonary hypertension: Secondary | ICD-10-CM | POA: Diagnosis present

## 2016-03-06 DIAGNOSIS — I10 Essential (primary) hypertension: Secondary | ICD-10-CM | POA: Diagnosis present

## 2016-03-06 DIAGNOSIS — M7581 Other shoulder lesions, right shoulder: Secondary | ICD-10-CM | POA: Diagnosis not present

## 2016-03-06 DIAGNOSIS — I5032 Chronic diastolic (congestive) heart failure: Secondary | ICD-10-CM | POA: Diagnosis not present

## 2016-03-06 DIAGNOSIS — S0990XA Unspecified injury of head, initial encounter: Secondary | ICD-10-CM | POA: Diagnosis not present

## 2016-03-06 DIAGNOSIS — E039 Hypothyroidism, unspecified: Secondary | ICD-10-CM | POA: Diagnosis not present

## 2016-03-06 DIAGNOSIS — Z8249 Family history of ischemic heart disease and other diseases of the circulatory system: Secondary | ICD-10-CM | POA: Diagnosis not present

## 2016-03-06 DIAGNOSIS — I6330 Cerebral infarction due to thrombosis of unspecified cerebral artery: Secondary | ICD-10-CM | POA: Insufficient documentation

## 2016-03-06 DIAGNOSIS — Z880 Allergy status to penicillin: Secondary | ICD-10-CM | POA: Diagnosis not present

## 2016-03-06 DIAGNOSIS — W19XXXA Unspecified fall, initial encounter: Secondary | ICD-10-CM | POA: Diagnosis present

## 2016-03-06 DIAGNOSIS — N390 Urinary tract infection, site not specified: Secondary | ICD-10-CM | POA: Diagnosis not present

## 2016-03-06 DIAGNOSIS — E785 Hyperlipidemia, unspecified: Secondary | ICD-10-CM | POA: Diagnosis not present

## 2016-03-06 DIAGNOSIS — I6200 Nontraumatic subdural hemorrhage, unspecified: Secondary | ICD-10-CM | POA: Diagnosis not present

## 2016-03-06 DIAGNOSIS — I739 Peripheral vascular disease, unspecified: Secondary | ICD-10-CM | POA: Diagnosis present

## 2016-03-06 DIAGNOSIS — I6522 Occlusion and stenosis of left carotid artery: Secondary | ICD-10-CM | POA: Diagnosis present

## 2016-03-06 DIAGNOSIS — E119 Type 2 diabetes mellitus without complications: Secondary | ICD-10-CM | POA: Diagnosis present

## 2016-03-06 DIAGNOSIS — S065X1S Traumatic subdural hemorrhage with loss of consciousness of 30 minutes or less, sequela: Secondary | ICD-10-CM | POA: Diagnosis not present

## 2016-03-06 DIAGNOSIS — S065X9A Traumatic subdural hemorrhage with loss of consciousness of unspecified duration, initial encounter: Secondary | ICD-10-CM | POA: Diagnosis present

## 2016-03-06 DIAGNOSIS — S065XAA Traumatic subdural hemorrhage with loss of consciousness status unknown, initial encounter: Secondary | ICD-10-CM | POA: Diagnosis present

## 2016-03-06 DIAGNOSIS — I6350 Cerebral infarction due to unspecified occlusion or stenosis of unspecified cerebral artery: Secondary | ICD-10-CM | POA: Diagnosis not present

## 2016-03-06 DIAGNOSIS — R29709 NIHSS score 9: Secondary | ICD-10-CM | POA: Diagnosis not present

## 2016-03-06 DIAGNOSIS — G8101 Flaccid hemiplegia affecting right dominant side: Secondary | ICD-10-CM | POA: Diagnosis not present

## 2016-03-06 DIAGNOSIS — I6789 Other cerebrovascular disease: Secondary | ICD-10-CM | POA: Diagnosis not present

## 2016-03-06 DIAGNOSIS — I63412 Cerebral infarction due to embolism of left middle cerebral artery: Secondary | ICD-10-CM | POA: Diagnosis not present

## 2016-03-06 DIAGNOSIS — Z886 Allergy status to analgesic agent status: Secondary | ICD-10-CM | POA: Diagnosis not present

## 2016-03-06 DIAGNOSIS — I169 Hypertensive crisis, unspecified: Secondary | ICD-10-CM | POA: Diagnosis present

## 2016-03-06 DIAGNOSIS — Z881 Allergy status to other antibiotic agents status: Secondary | ICD-10-CM | POA: Diagnosis not present

## 2016-03-06 DIAGNOSIS — I63512 Cerebral infarction due to unspecified occlusion or stenosis of left middle cerebral artery: Secondary | ICD-10-CM | POA: Diagnosis not present

## 2016-03-06 DIAGNOSIS — F068 Other specified mental disorders due to known physiological condition: Secondary | ICD-10-CM | POA: Diagnosis not present

## 2016-03-06 DIAGNOSIS — I672 Cerebral atherosclerosis: Secondary | ICD-10-CM | POA: Diagnosis present

## 2016-03-06 DIAGNOSIS — Z79899 Other long term (current) drug therapy: Secondary | ICD-10-CM | POA: Diagnosis not present

## 2016-03-06 DIAGNOSIS — R4701 Aphasia: Secondary | ICD-10-CM | POA: Diagnosis not present

## 2016-03-06 DIAGNOSIS — I161 Hypertensive emergency: Secondary | ICD-10-CM | POA: Diagnosis present

## 2016-03-06 DIAGNOSIS — G8191 Hemiplegia, unspecified affecting right dominant side: Secondary | ICD-10-CM | POA: Diagnosis not present

## 2016-03-06 DIAGNOSIS — I639 Cerebral infarction, unspecified: Secondary | ICD-10-CM | POA: Diagnosis not present

## 2016-03-06 LAB — RAPID URINE DRUG SCREEN, HOSP PERFORMED
AMPHETAMINES: NOT DETECTED
BENZODIAZEPINES: NOT DETECTED
Barbiturates: POSITIVE — AB
COCAINE: NOT DETECTED
OPIATES: NOT DETECTED
TETRAHYDROCANNABINOL: NOT DETECTED

## 2016-03-06 LAB — I-STAT CHEM 8, ED
BUN: 19 mg/dL (ref 6–20)
CREATININE: 0.7 mg/dL (ref 0.44–1.00)
Calcium, Ion: 1.11 mmol/L — ABNORMAL LOW (ref 1.13–1.30)
Chloride: 104 mmol/L (ref 101–111)
Glucose, Bld: 174 mg/dL — ABNORMAL HIGH (ref 65–99)
HEMATOCRIT: 46 % (ref 36.0–46.0)
Hemoglobin: 15.6 g/dL — ABNORMAL HIGH (ref 12.0–15.0)
POTASSIUM: 3.6 mmol/L (ref 3.5–5.1)
SODIUM: 139 mmol/L (ref 135–145)
TCO2: 21 mmol/L (ref 0–100)

## 2016-03-06 LAB — CBC
HCT: 41.3 % (ref 36.0–46.0)
HEMOGLOBIN: 14 g/dL (ref 12.0–15.0)
MCH: 30.3 pg (ref 26.0–34.0)
MCHC: 33.9 g/dL (ref 30.0–36.0)
MCV: 89.4 fL (ref 78.0–100.0)
PLATELETS: 179 10*3/uL (ref 150–400)
RBC: 4.62 MIL/uL (ref 3.87–5.11)
RDW: 13.5 % (ref 11.5–15.5)
WBC: 7.2 10*3/uL (ref 4.0–10.5)

## 2016-03-06 LAB — COMPREHENSIVE METABOLIC PANEL
ALBUMIN: 3.7 g/dL (ref 3.5–5.0)
ALT: 17 U/L (ref 14–54)
ANION GAP: 12 (ref 5–15)
AST: 24 U/L (ref 15–41)
Alkaline Phosphatase: 77 U/L (ref 38–126)
BILIRUBIN TOTAL: 0.8 mg/dL (ref 0.3–1.2)
BUN: 16 mg/dL (ref 6–20)
CO2: 19 mmol/L — AB (ref 22–32)
Calcium: 9.3 mg/dL (ref 8.9–10.3)
Chloride: 105 mmol/L (ref 101–111)
Creatinine, Ser: 0.83 mg/dL (ref 0.44–1.00)
GFR calc Af Amer: >60 mL/min (ref >60)
GFR calc non Af Amer: 59 mL/min — ABNORMAL LOW (ref >60)
GLUCOSE: 180 mg/dL — AB (ref 65–99)
POTASSIUM: 3.6 mmol/L (ref 3.5–5.1)
SODIUM: 136 mmol/L (ref 135–145)
TOTAL PROTEIN: 7.6 g/dL (ref 6.5–8.1)

## 2016-03-06 LAB — URINALYSIS, ROUTINE W REFLEX MICROSCOPIC
BILIRUBIN URINE: NEGATIVE
GLUCOSE, UA: 250 mg/dL — AB
KETONES UR: NEGATIVE mg/dL
Nitrite: NEGATIVE
PH: 6 (ref 5.0–8.0)
Protein, ur: 100 mg/dL — AB
SPECIFIC GRAVITY, URINE: 1.018 (ref 1.005–1.030)

## 2016-03-06 LAB — DIFFERENTIAL
BASOS ABS: 0 10*3/uL (ref 0.0–0.1)
Basophils Relative: 0 %
EOS ABS: 0.1 10*3/uL (ref 0.0–0.7)
EOS PCT: 1 %
LYMPHS ABS: 1.1 10*3/uL (ref 0.7–4.0)
LYMPHS PCT: 16 %
Monocytes Absolute: 0.4 10*3/uL (ref 0.1–1.0)
Monocytes Relative: 6 %
NEUTROS PCT: 77 %
Neutro Abs: 5.6 10*3/uL (ref 1.7–7.7)

## 2016-03-06 LAB — ETHANOL: Alcohol, Ethyl (B): <5 mg/dL (ref <5)

## 2016-03-06 LAB — MRSA PCR SCREENING: MRSA BY PCR: NEGATIVE

## 2016-03-06 LAB — I-STAT TROPONIN, ED: Troponin i, poc: 0.39 ng/mL (ref 0.00–0.08)

## 2016-03-06 LAB — URINE MICROSCOPIC-ADD ON

## 2016-03-06 LAB — PROTIME-INR
INR: 1.21 (ref 0.00–1.49)
PROTHROMBIN TIME: 15.5 s — AB (ref 11.6–15.2)

## 2016-03-06 LAB — APTT: APTT: 39 s — AB (ref 24–37)

## 2016-03-06 MED ORDER — PRIMIDONE 250 MG PO TABS
250.0000 mg | ORAL_TABLET | Freq: Every day | ORAL | Status: DC
  Administered 2016-03-06 – 2016-03-09 (×4): 250 mg via ORAL
  Filled 2016-03-06 (×6): qty 1

## 2016-03-06 MED ORDER — STROKE: EARLY STAGES OF RECOVERY BOOK
Freq: Once | Status: AC
  Administered 2016-03-06: 04:00:00
  Filled 2016-03-06: qty 1

## 2016-03-06 MED ORDER — ACETAMINOPHEN 650 MG RE SUPP: 650.0000 mg | RECTAL | Status: DC | PRN

## 2016-03-06 MED ORDER — ONDANSETRON HCL 4 MG/2ML IJ SOLN
4.0000 mg | Freq: Once | INTRAMUSCULAR | Status: AC
  Administered 2016-03-06: 4 mg via INTRAVENOUS
  Filled 2016-03-06: qty 2

## 2016-03-06 MED ORDER — ONDANSETRON HCL 4 MG/2ML IJ SOLN
4.0000 mg | Freq: Four times a day (QID) | INTRAMUSCULAR | Status: DC | PRN
  Administered 2016-03-08 – 2016-03-10 (×2): 4 mg via INTRAVENOUS
  Filled 2016-03-06 (×2): qty 2

## 2016-03-06 MED ORDER — NICARDIPINE HCL IN NACL 20-0.86 MG/200ML-% IV SOLN
3.0000 mg/h | INTRAVENOUS | Status: DC
  Administered 2016-03-06 (×2): 5 mg/h via INTRAVENOUS
  Administered 2016-03-06: 3 mg/h via INTRAVENOUS
  Administered 2016-03-06 – 2016-03-07 (×3): 5 mg/h via INTRAVENOUS
  Administered 2016-03-08: 3 mg/h via INTRAVENOUS
  Filled 2016-03-06 (×8): qty 200

## 2016-03-06 MED ORDER — SENNOSIDES-DOCUSATE SODIUM 8.6-50 MG PO TABS
1.0000 | ORAL_TABLET | Freq: Two times a day (BID) | ORAL | Status: DC
  Administered 2016-03-06 – 2016-03-10 (×9): 1 via ORAL
  Filled 2016-03-06 (×9): qty 1

## 2016-03-06 MED ORDER — LEVOTHYROXINE SODIUM 75 MCG PO TABS
75.0000 ug | ORAL_TABLET | Freq: Every day | ORAL | Status: DC
  Administered 2016-03-06 – 2016-03-10 (×5): 75 ug via ORAL
  Filled 2016-03-06 (×5): qty 1

## 2016-03-06 MED ORDER — IOPAMIDOL (ISOVUE-370) INJECTION 76%
100.0000 mL | Freq: Once | INTRAVENOUS | Status: AC | PRN
  Administered 2016-03-06: 100 mL via INTRAVENOUS

## 2016-03-06 MED ORDER — CLONIDINE HCL 0.1 MG PO TABS
0.1000 mg | ORAL_TABLET | Freq: Three times a day (TID) | ORAL | Status: DC
  Administered 2016-03-06 – 2016-03-07 (×6): 0.1 mg via ORAL
  Filled 2016-03-06 (×6): qty 1

## 2016-03-06 MED ORDER — IOPAMIDOL (ISOVUE-370) INJECTION 76%
INTRAVENOUS | Status: AC
  Filled 2016-03-06: qty 100

## 2016-03-06 MED ORDER — PANTOPRAZOLE SODIUM 40 MG IV SOLR
40.0000 mg | Freq: Every day | INTRAVENOUS | Status: DC
  Administered 2016-03-06: 40 mg via INTRAVENOUS
  Filled 2016-03-06: qty 40

## 2016-03-06 MED ORDER — CARVEDILOL 6.25 MG PO TABS
6.2500 mg | ORAL_TABLET | Freq: Two times a day (BID) | ORAL | Status: DC
  Administered 2016-03-06 – 2016-03-10 (×10): 6.25 mg via ORAL
  Filled 2016-03-06 (×10): qty 1

## 2016-03-06 MED ORDER — NICARDIPINE HCL IN NACL 20-0.86 MG/200ML-% IV SOLN: 3.0000 mg/h | INTRAVENOUS | Status: DC

## 2016-03-06 MED ORDER — HYDRALAZINE HCL 20 MG/ML IJ SOLN
INTRAMUSCULAR | Status: AC
  Administered 2016-03-06: 20 mg
  Filled 2016-03-06: qty 2

## 2016-03-06 MED ORDER — ACETAMINOPHEN 325 MG PO TABS
650.0000 mg | ORAL_TABLET | ORAL | Status: DC | PRN
  Administered 2016-03-10: 650 mg via ORAL
  Filled 2016-03-06: qty 2

## 2016-03-06 NOTE — ED Notes (Signed)
This RN in room with pt to start IV, Pt unable to lift right arm and pt and family states that this began since the pt got to the ED. Pt could move arm fine with EMS.

## 2016-03-06 NOTE — Care Management Note (Signed)
Case Management Note  Patient Details  Name: Ivar Drapelizabeth M Asbill MRN: [id number] Date of Birth: 11/12/1920  Subjective/Objective:                    Action/Plan:  Initial UR completed . CM will continue to follow for discharge needs. Expected Discharge Date:                  Expected Discharge Plan:     In-House Referral:     Discharge planning Services     Post Acute Care Choice:    Choice offered to:     DME Arranged:    DME Agency:     HH Arranged:    HH Agency:     Status of Service:  In process, will continue to follow  Medicare Important Message Given:    Date Medicare IM Given:    Medicare IM give by:    Date Additional Medicare IM Given:    Additional Medicare Important Message give by:     If discussed at Long Length of Stay Meetings, dates discussed:    Additional Comments:  Kingsley PlanWile, Ritaj Dullea Marie, RN 03/06/2016, 8:07 AM

## 2016-03-06 NOTE — ED Notes (Signed)
Pt NPO. Stroke swallow screen not completed

## 2016-03-06 NOTE — Progress Notes (Signed)
STROKE TEAM PROGRESS NOTE   HISTORY OF PRESENT ILLNESS [name] Colon is a 82 y.o. female who fell down today and hit her head on the right side and came to the hospital for that reason. The CT in the ED was read as normal, and CT neck also normal. She was in the ED and felt to be completely normal at 10 pm 03/05/2016 (LKW). Later she was found to have plegic right arm and leg. Code stroke called and Dr. Cena BentonVega came to see pt. Confirmed plegia. NIHSS = 9. CT head showed an unusual looking hyperdensity both in the first and the second scans of the head. He called to discuss the case with radiologist and a request for a 5 second delay exam after a CTA which he also ordered as part of the evaluation. This of course delayed decision for tPA because given the head trauma, even if minor, makes this hyperdensity likely to be a SDH.   Addendum - after long discussion with neuroradiologist it appears that there are blood products in the CT scan that preclude the use of iv TPA in this patient. Her CTA also does not show an acute large vessel occlusion.   She was admitted to the neuro ICU for further evaluation and treatment.  Iv TPA :  no due to subdural and scalp hematoma  SUBJECTIVE (INTERVAL HISTORY) No family is present at the bedside.  Her son called and checked on her overnight. Her nurse reports she has done well, mildly demented, but awake and pleasant.   OBJECTIVE Temp:  [98.1 F (36.7 C)-98.4 F (36.9 C)] 98.1 F (36.7 C) (04/21 0400) Pulse Rate:  [73-87] 87 (04/21 0700) Cardiac Rhythm:  [-] Normal sinus rhythm (04/21 0400) Resp:  [18-25] 23 (04/21 0700) BP: (111-270)/(41-98) 132/45 mmHg (04/21 0700) SpO2:  [91 %-100 %] 92 % (04/21 0700) Weight:  [62.9 kg (138 lb 10.7 oz)-63.504 kg (140 lb)] 62.9 kg (138 lb 10.7 oz) (04/21 0323)  CBC:  Recent Labs Lab 03/06/16 0032 03/06/16 0034  WBC 7.2  --   NEUTROABS 5.6  --   HGB 14.0 15.6*  HCT 41.3 46.0  MCV 89.4  --   PLT 179  --      Basic Metabolic Panel:  Recent Labs Lab 03/06/16 0032 03/06/16 0034  NA 136 139  K 3.6 3.6  CL 105 104  CO2 19*  --   GLUCOSE 180* 174*  BUN 16 19  CREATININE 0.83 0.70  CALCIUM 9.3  --     Lipid Panel: No results found for: CHOL, TRIG, HDL, CHOLHDL, VLDL, LDLCALC HgbA1c:  Lab Results  Component Value Date   HGBA1C * 03/05/2011    8.0 (NOTE)                                                                       According to the ADA Clinical Practice Recommendations for 2011, when HbA1c is used as a screening test:   >=6.5%   Diagnostic of Diabetes Mellitus           (if abnormal result  is confirmed)  5.7-6.4%   Increased risk of developing Diabetes Mellitus  References:Diagnosis and Classification of Diabetes Mellitus,Diabetes Care,2011,34(Suppl 1):S62-S69 and Standards of Medical Care  in         Diabetes - 2011,Diabetes Care,2011,34  (Suppl 1):S11-S61.   Urine Drug Screen:    Component Value Date/Time   LABOPIA NONE DETECTED 03/06/2016 0038   COCAINSCRNUR NONE DETECTED 03/06/2016 0038   LABBENZ NONE DETECTED 03/06/2016 0038   AMPHETMU NONE DETECTED 03/06/2016 0038   THCU NONE DETECTED 03/06/2016 0038   LABBARB POSITIVE* 03/06/2016 0038      IMAGING  CT HEAD 03/05/2016  No acute intracranial abnormalities. Chronic atrophy and small vessel ischemic changes. Degenerative changes in the cervical spine.  Normal alignment.  03/06/2016   Areas of subdural hematoma, stable from earlier in the day. No new foci of hemorrhage. No significant mass effect and no midline shift apparent. Atrophy with small vessel disease is stable. No acute infarct evident.  03/06/2016   Small acute subdural hematoma along LEFT cerebellar tentorium and LEFT posterior temporal lobe. Moderate RIGHT scalp hematoma.  No skull fracture. Stable involutional changes and moderate to severe chronic small vessel ischemic disease.   CTA NECK 03/06/2016  80- 90% focal stenosis LEFT internal carotid artery origin.  Less than 50% stenosis RIGHT internal carotid artery origin.   CTA HEAD 03/06/2016   No emergent large vessel occlusion. Moderate to severe stenosis bilateral cavernous internal carotid arteries due to calcific atherosclerosis.   Ct Cervical Spine Wo Contrast 03/05/2016   No acute intracranial abnormalities. Chronic atrophy and small vessel ischemic changes. Degenerative changes in the cervical spine.    Dg Knee Complete 4 Views Left 03/05/2016  Sclerosis in the distal left femur consistent with enchondroma or bone infarct. No acute fracture or dislocation.    PHYSICAL EXAM Frail elderly African-American lady who appears not to be in distress. . Afebrile. Head is nontraumatic. Neck is supple without bruit.    Cardiac exam no murmur or gallop. Lungs are clear to auscultation. Distal pulses are well felt. Neurological Exam :  Awake alert disoriented 3. Dementia tension, registration and recall. Speech is clear but at times tangential and nonsensical. Follows simple midline and one-step commands only. Pupils are both irregular and reactive. Fundi were not visualized. Blinks to threat bilaterally but more on the left compared to the right. Right lower facial weakness. Tongue is midline. Dense right hemiplegia with 0/5 right upper extremity and 1-2/5 right lower extremity strength. Purposeful and antigravity normal strength on the left side. Right plantar upgoing left downgoing. Gait was not tested. ASSESSMENT/PLAN [name] Colon is a 82 y.o. female with history of HTN, DB, thyroid disease who fell and hit her head. In the ED, she developed right arm and leg weakness. CT read as SDH, CTA read as without acute occlusion. She did not receive IV t-PA due to SDH.   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 in setting of head trauma with L SDH and R scalp hematoma  Resultant  R hemiparesis, expressive/receptive aphasia  CT L SDH, R scalp hematoma, no fx  CTA head no  large vessel stenosis  CTA neck L ICA 80-90% stenosis  MRI  pending   2D Echo  pending   LDL ordered   HgbA1c ordered   SCDs for VTE prophylaxis  Diet NPO time specified  No antithrombotic prior to admission, now on No antithrombotic due to SDH  Ongoing aggressive stroke risk factor management  Therapy recommendations:  pending   Disposition:  pending   Hypertension  Stable  Diabetes  HgbA1c pending , goal < 7.0  Other  Stroke Risk Factors  Advanced age  Hospital day # 0  Rhoderick Moody [facility] See Amion for Pager information 03/06/2016 4:32 PM  I have personally examined this patient, reviewed notes, independently viewed imaging studies, participated in medical decision making and plan of care. I have made any additions or clarifications directly to the above note. Agree with note above. She presented with a fall and subsequent developed left hemiplegia in the emergency room but was not considered for TPA due to presence of hyperdensity over the left tentorium raising concern for subdural hematoma. CT angiogram shows a high-grade proximal left carotid and moderate bilateral cavernous carotid stenosis. She likely has right subcortical infarct and is at risk for neurological worsening, recurrent stroke, TIA,expansion of subdural carotid occlusion and needs close neurological monitoring and stroke evaluation and aggressive risk factor modification. Family not available at the bedside for discussion. Plan to check MRI scan today. We will need to hold aspirin due to tentorial subdural hemorrhage related to fall. This patient is critically ill and at significant risk of neurological worsening, death and care requires constant monitoring of vital signs, hemodynamics,respiratory and cardiac monitoring, extensive review of multiple databases, frequent neurological assessment, discussion with family, other specialists and medical decision making of high complexity.I  have made any additions or clarifications directly to the above note.This critical care time does not reflect procedure time, or teaching time or supervisory time of PA/NP/Med Resident etc but could involve care discussion time.  I spent 34 minutes of neurocritical care time  in the care of  this patient.     Delia Heady, MD Medical Director [facility] Pager: [phone] 03/06/2016 6:07 PM    To contact Stroke Continuity provider, please refer to [email or web address]. After hours, contact General Neurology

## 2016-03-06 NOTE — Code Documentation (Signed)
A code stroke was called on this pt at 0021.  At 2030 hrs she had an unwitnessed fall at home while cooking in the kitchen.  Her son heard a thud, came to the kitchen and found he unconscious and lying on her right side.  Her son estimates she was "out of it" for about 10 minutes.  EMS was summoned by the son.  They reported no stroke like symptoms.[facility].  She was LSW at 2200 hrs..  Her initial head CT  at 2308 showed no acute intracranial abnormalities.    At 0017 hrs  Caryn BeeKevin, RN went to her bedside to establish IV access when he noted her to be flacid on her right side.  Dr Mora Bellmanni was summoned to the bedside at 0019 and the code stroke was then called at 0021 hrs.       Pt's initial NIHSS was 9.    RRT and Dr Cena BentonVega arrived at [facility]0025 with pt arrival in CT at 0027 hrs.  Pt was retained in radiology for  CTA .  Delay was due to difficult IV access.   At 0100 pt BP 236/75  SR 88 RR 22 with 96% O2 sats on RA  IV access was obtained at 0115 and Hydralazine 10 mg IV was given at 0132 and pt was returned toED  A7 .  At  0030 Head CT results of a small acute SDH along the left cerebellar tentorium and left posterior temporal lobe  and CTA head  Results of no emergent large vessel occlusion were   called to Dr Cena BentonVega contraindicating the use of TPA.                             0143  BP 213/75 and Cardene drip was begun at 5 mg/hr per Dr Cena BentonVega.  Pt to be admitted to neuro ICU

## 2016-03-06 NOTE — Evaluation (Signed)
Speech Language Pathology Evaluation Patient Details Name: [name] Colon MRN: [id number] DOB: 01/20/1920 Today's Date: 03/06/2016 Time: [id number] SLP Time Calculation (min) (ACUTE ONLY): 14 min  Problem List:  Patient Active Problem List   Diagnosis Date Noted  . Hypertensive crisis 03/06/2016  . SDH (subdural hematoma) (HCC) 03/06/2016  . Cerebral thrombosis with cerebral infarction 03/06/2016  . Bowel obstruction (HCC) 09/28/2013  . SIRS (systemic inflammatory response syndrome) (HCC) 09/25/2013  . Thrombocytopenia (HCC) 09/25/2013  . Hypothyroidism 09/25/2013  . Leukopenia 09/25/2013  . Poor venous access 09/25/2013  . HTN (hypertension) 09/25/2013  . ?? Wide-complex tachycardia 09/25/2013  . Dehydration with hyponatremia 09/25/2013  . Claudication (HCC) 08/21/2013  . Type 2 diabetes mellitus (HCC) 08/21/2013   Past Medical History:  Past Medical History  Diagnosis Date  . Hypertension   . Diabetes mellitus   . Thyroid disease   . Claudication [facility](HCC)    Past Surgical History:  Past Surgical History  Procedure Laterality Date  . Appendectomy    . Tonsillectomy     HPI:  82 y.o. female with h/o HTN, DM and thyroid disease, who presented to ED for evaluation of fall. CT Head 4/21 small acute subdural hematoma along L cerebellar tentorium and L posterior temporal lobe. Moderate R scalp hematoma. Stable involutional changes and moderate to severe chronic small vessel ischemic disease. No prior h/o SLP intervention found in chart.   Assessment / Plan / Recommendation Clinical Impression  SLP assessed pt's speech, language and cognition skills after mechanical fall at home. Pt demonstrated focused and sustained attention deficits, which likely affected performence during evaluation. Low vocal intensity and reduced intelligibilty also noted. Pt oriented to person and with mod semantic cues, orients to place. Pt demonstrated 50% accuracy when following 1-step commands and 50%  accuracy during confrontational naming task, indicative of both auditory comprehension and verbal expression deficits. SLP will continue to follow to provide diagnostic tx for speech, language and cognition.    SLP Assessment  Patient needs continued Speech Lanaguage Pathology Services    Follow Up Recommendations   (TBD)    Frequency and Duration min 2x/week  2 weeks      SLP Evaluation Prior Functioning  Cognitive/Linguistic Baseline: Information not available   Cognition  Overall Cognitive Status: Impaired/Different from baseline Arousal/Alertness: Lethargic Orientation Level: Oriented to person;Oriented to place;Oriented to time;Disoriented to situation Attention: Focused;Sustained Focused Attention: Impaired Focused Attention Impairment: Verbal basic;Functional basic Sustained Attention: Impaired Sustained Attention Impairment: Verbal basic;Functional basic Memory:  (will assess) Awareness:  (will assess) Problem Solving: Impaired    Comprehension  Auditory Comprehension Overall Auditory Comprehension: Impaired Yes/No Questions: Within Functional Limits Commands: Impaired One Step Basic Commands: 25-49% accurate Conversation: Simple Interfering Components: Attention;Visual impairments;Pain EffectiveTechniques: Extra processing time;Increased volume;Repetition Visual Recognition/Discrimination Discrimination: Not tested Reading Comprehension Reading Status: Not tested    Expression Expression Primary Mode of Expression: Verbal Verbal Expression Overall Verbal Expression: Impaired Initiation: No impairment Level of Generative/Spontaneous Verbalization: Word Repetition: Impaired Level of Impairment: Phrase level Naming: Impairment Confrontation: Impaired Pragmatics: No impairment Interfering Components: Attention;Speech intelligibility Effective Techniques: Semantic cues Non-Verbal Means of Communication: Not applicable Written Expression Written  Expression: Not tested   Oral / Motor  Oral Motor/Sensory Function Overall Oral Motor/Sensory Function: Within functional limits Motor Speech Overall Motor Speech: Impaired Respiration: Within functional limits Phonation: Low vocal intensity Resonance: Within functional limits Articulation: Impaired Level of Impairment: Word Intelligibility: Intelligibility reduced Word: 25-49% accurate Phrase: 0-24% accurate Sentence: Not tested Conversation: Not tested Motor Planning: Witnin  functional limits Motor Speech Errors: Unaware;Consistent   [name], Student-SLP                  [name] 03/06/2016, 3:19 PM

## 2016-03-06 NOTE — ED Notes (Signed)
New onset facial droop. MD notified

## 2016-03-06 NOTE — Consult Note (Addendum)
Neurology Consultation Reason for Consult: code stroke Referring Physician: Ed attending  CC: right arm and leg weakness  History is obtained from patient ED staff and patient's son.  HPI: [name] Colon is a 82 y.o. female who fell down today and hit her head on the right side and came to the hospital for that reason.  The CT in the ED was read as normal, and CT neck also normal.  She was in the ED and felt to be completely normal at 10pm.  Later she was found to have plegic right arm and leg.  Code stroke called and I came to see pt.  Confirmed plegia.  NIHSS = 9.  CT head showed an unusual looking hyperdensity both in the first and the second scans of the head. I called to discuss the case with radiologist and a request for a 5 second delay exam after a CTA which I also ordered as part of the evaluation.  This of course delayed decision for tPA because given the head trauma, even if minor, makes this hyperdensity likely to be a SDH.  Addendum - after long discussion with neuroradiologist it appears that there are blood products in the CT scan that preclude the use of iv TPA in this patient.  Her CTA also does not show an acute large vessel occlusion.   LKW: 10pm tpa given?: no  ROS: A 14 point ROS was performed and is negative except as noted in the HPI.    Past Medical History  Diagnosis Date  . Hypertension   . Diabetes mellitus   . Thyroid disease   . Claudication [facility])     Family History  Problem Relation Age of Onset  . Heart failure Mother   . Lung disease Father     Black Lung disease    Social History:  reports that she has never smoked. She does not have any smokeless tobacco history on file. She reports that she does not drink alcohol or use illicit drugs.   Exam: Current vital signs: BP 250/86 mmHg  Pulse 82  Temp(Src) 98.4 F (36.9 C) (Oral)  Resp 22  Ht  (1.651 m)  Wt 63.504 kg (140 lb)  BMI 23.30 kg/m2  SpO2 94% Vital signs in last 24  hours: Temp:  [98.4 F (36.9 C)] 98.4 F (36.9 C) (04/20 2153) Pulse Rate:  [73-82] 82 (04/20 2315) Resp:  [18-24] 22 (04/20 2315) BP: (250-270)/(84-97) 250/86 mmHg (04/20 2315) SpO2:  [94 %-97 %] 94 % (04/20 2315) Weight:  [63.504 kg (140 lb)] 63.504 kg (140 lb) (04/20 2153)   Physical Exam  Constitutional: Appears well-developed and well-nourished.  Psych: Affect appropriate to situation Eyes: No scleral injection HENT: No OP obstrucion Head: Normocephalic.  Cardiovascular: Normal rate and regular rhythm.  Respiratory: Effort normal and breath sounds normal to anterior ascultation GI: Soft.  No distension. There is no tenderness.  Skin: WDI  Neuro: Mental Status: Patient is awake, alert, oriented to person, place, month, year, and situation Patient is able to give a clear and coherent history No signs of aphasia or neglect Cranial Nerves: II: Visual Fields are full. Pupils are equal, round, and reactive to light.  III,IV, VI: EOMI without ptosis or diploplia.  V: Facial sensation is symmetric to temperature VII: Facial movement is symmetric.  VIII: hearing is intact to voice X: Uvula elevates symmetrically XI: Shoulder shrug is symmetric. XII: tongue is midline without atrophy or fasciculations.  Motor: Tone is normal. Bulk is  normal. Right arm and leg are plegic Sensory: Sensation is symmetric to light touch and temperature in the arms and legs Deep Tendon Reflexes: areflexic Plantars: upgoing toe on right Cerebellar: Unable to test on right    I have reviewed labs in epic and the results pertinent to this consultation are:  I have reviewed the images obtained: likely left SDH next to transverse sinus on left side.  Impression: 1) left MCA territory stroke and 2) left SDH  Recommendations: 1) admit to ICU.  SBP < 140-160; no antiplatelets or lovenox.  This case will be signed out to the stroke attending who will continue to see patient after tomorrow.   Please note that I spent in excess of 2 hours in this case.  Case was d/w hospitalist, pateint, rapid response nurse, pt's son, ED attending.  I personally reviewed all the imaging studies, including CT, CTA, CT with 5 minute contrast delay.

## 2016-03-06 NOTE — Progress Notes (Signed)
Pt's son brought in her upper partial today at 1300.  I labeled the cup and lid and set them at the sink.

## 2016-03-06 NOTE — ED Provider Notes (Signed)
12:20 AM I was called to the room to evaluate the patient who has new onset right upper and lower extremity weakness. Per husband, he states this occurred sometime between when she first got to the emergency department and now. Her blood pressure has been over 250 systolic. Code stroke was called, the Cardene drip ordered. Will repeat her CT scan of the head to assess for any bleeding.  CT scan reveals bleeding in the brain. Dr. Cena BentonVega with neurology will make the patient for further care. I informed Dr. Antionette Charpyd of this finding. Patient placed on drip for blood pressure control. She continues to be in critical condition.   CRITICAL CARE Performed by: Tomasita CrumbleNI,Adelayde Minney   Total critical care time: 45 minutes - hemorrhagic stroke  Critical care time was exclusive of separately billable procedures and treating other patients.  Critical care was necessary to treat or prevent imminent or life-threatening deterioration.  Critical care was time spent personally by me on the following activities: development of treatment plan with patient and/or surrogate as well as nursing, discussions with consultants, evaluation of patient's response to treatment, examination of patient, obtaining history from patient or surrogate, ordering and performing treatments and interventions, ordering and review of laboratory studies, ordering and review of radiographic studies, pulse oximetry and re-evaluation of patient's condition.   Tomasita CrumbleAdeleke Dyon Rotert, MD 03/06/16 [phone]

## 2016-03-06 NOTE — ED Notes (Signed)
Pt transported to CT by Bunnie PionPaula RN and Neurologist.

## 2016-03-06 NOTE — Progress Notes (Signed)
Patients speech had been clear and fluent. At 0620 upon assessment noted patient had some difficulty speaking and speech more slurred. Notified Vega. Stat head CT performed.

## 2016-03-07 ENCOUNTER — Inpatient Hospital Stay (HOSPITAL_COMMUNITY): Payer: Medicare Other

## 2016-03-07 DIAGNOSIS — I639 Cerebral infarction, unspecified: Secondary | ICD-10-CM

## 2016-03-07 DIAGNOSIS — E785 Hyperlipidemia, unspecified: Secondary | ICD-10-CM

## 2016-03-07 DIAGNOSIS — I6789 Other cerebrovascular disease: Secondary | ICD-10-CM

## 2016-03-07 DIAGNOSIS — I169 Hypertensive crisis, unspecified: Secondary | ICD-10-CM

## 2016-03-07 LAB — BASIC METABOLIC PANEL
ANION GAP: 11 (ref 5–15)
BUN: 10 mg/dL (ref 6–20)
CALCIUM: 8.8 mg/dL — AB (ref 8.9–10.3)
CO2: 20 mmol/L — ABNORMAL LOW (ref 22–32)
CREATININE: 0.82 mg/dL (ref 0.44–1.00)
Chloride: 105 mmol/L (ref 101–111)
GFR calc Af Amer: >60 mL/min (ref >60)
GFR, EST NON AFRICAN AMERICAN: 60 mL/min — AB (ref >60)
GLUCOSE: 155 mg/dL — AB (ref 65–99)
Potassium: 3.9 mmol/L (ref 3.5–5.1)
Sodium: 136 mmol/L (ref 135–145)

## 2016-03-07 LAB — LIPID PANEL
CHOL/HDL RATIO: 4.2 ratio
Cholesterol: 191 mg/dL (ref 0–200)
HDL: 46 mg/dL (ref >40)
LDL Cholesterol: 128 mg/dL — ABNORMAL HIGH (ref 0–99)
TRIGLYCERIDES: 84 mg/dL (ref <150)
VLDL: 17 mg/dL (ref 0–40)

## 2016-03-07 LAB — CBC
HCT: 42.1 % (ref 36.0–46.0)
Hemoglobin: 14.1 g/dL (ref 12.0–15.0)
MCH: 30.5 pg (ref 26.0–34.0)
MCHC: 33.5 g/dL (ref 30.0–36.0)
MCV: 91.1 fL (ref 78.0–100.0)
PLATELETS: 184 10*3/uL (ref 150–400)
RBC: 4.62 MIL/uL (ref 3.87–5.11)
RDW: 13.9 % (ref 11.5–15.5)
WBC: 7.8 10*3/uL (ref 4.0–10.5)

## 2016-03-07 LAB — ECHOCARDIOGRAM COMPLETE
Height: 60 in
WEIGHTICAEL: 2218.71 [oz_av]

## 2016-03-07 LAB — GLUCOSE, CAPILLARY: Glucose-Capillary: 166 mg/dL — ABNORMAL HIGH (ref 65–99)

## 2016-03-07 MED ORDER — ATORVASTATIN CALCIUM 40 MG PO TABS
40.0000 mg | ORAL_TABLET | Freq: Every day | ORAL | Status: DC
  Administered 2016-03-07 – 2016-03-10 (×4): 40 mg via ORAL
  Filled 2016-03-07 (×4): qty 1

## 2016-03-07 MED ORDER — INSULIN ASPART 100 UNIT/ML ~~LOC~~ SOLN
0.0000 [IU] | Freq: Three times a day (TID) | SUBCUTANEOUS | Status: DC
  Administered 2016-03-08: 2 [IU] via SUBCUTANEOUS
  Administered 2016-03-08: 3 [IU] via SUBCUTANEOUS
  Administered 2016-03-08: 2 [IU] via SUBCUTANEOUS
  Administered 2016-03-09: 5 [IU] via SUBCUTANEOUS
  Administered 2016-03-09: 2 [IU] via SUBCUTANEOUS
  Administered 2016-03-09 – 2016-03-10 (×3): 3 [IU] via SUBCUTANEOUS
  Administered 2016-03-10: 2 [IU] via SUBCUTANEOUS

## 2016-03-07 MED ORDER — HEPARIN SODIUM (PORCINE) 5000 UNIT/ML IJ SOLN
5000.0000 [IU] | Freq: Two times a day (BID) | INTRAMUSCULAR | Status: DC
  Administered 2016-03-07 – 2016-03-10 (×6): 5000 [IU] via SUBCUTANEOUS
  Filled 2016-03-07 (×6): qty 1

## 2016-03-07 MED ORDER — PANTOPRAZOLE SODIUM 40 MG PO TBEC
40.0000 mg | DELAYED_RELEASE_TABLET | Freq: Every day | ORAL | Status: DC
  Administered 2016-03-07 – 2016-03-09 (×3): 40 mg via ORAL
  Filled 2016-03-07 (×3): qty 1

## 2016-03-07 MED ORDER — ASPIRIN 300 MG RE SUPP
300.0000 mg | Freq: Every day | RECTAL | Status: DC
  Administered 2016-03-07 – 2016-03-09 (×3): 300 mg via RECTAL
  Filled 2016-03-07 (×3): qty 1

## 2016-03-07 NOTE — Progress Notes (Signed)
PT Cancellation Note  Patient Details Name: [name] Drapelizabeth M Fatzinger MRN: [id number] DOB: 09/30/1920   Cancelled Treatment:    Reason Eval/Treat Not Completed: Medical issues which prohibited therapy; RN reports pt guarding L hip and not yet cleared radiologically.  Will await to complete PT eval once hip is cleared.   Elray McgregorCynthia Cayenne Breault 03/07/2016, 8:45 AM Sheran Lawlessyndi Kerrion Kemppainen, PT [phone] 03/07/2016

## 2016-03-07 NOTE — Progress Notes (Signed)
  Echocardiogram 2D Echocardiogram has been performed.  Janalyn HarderWest, Saraya Tirey R 03/07/2016, 10:10 AM

## 2016-03-07 NOTE — Progress Notes (Signed)
*  PRELIMINARY RESULTS* Vascular Ultrasound Lower extremity venous duplex has been completed.  Preliminary findings: No evidence of DVT or baker's cyst.   Farrel DemarkJill Eunice, RDMS, RVT  03/07/2016, 1:22 PM

## 2016-03-07 NOTE — Progress Notes (Signed)
STROKE TEAM PROGRESS NOTE   HISTORY OF PRESENT ILLNESS  [name] Colon is a 82 y.o. female who fell down today and hit her head on the right side and came to the hospital for that reason. The CT in the ED was read as normal, and CT neck also normal. She was in the ED and felt to be completely normal at 10pm. Later she was found to have plegic right arm and leg. Code stroke called and I came to see pt. Confirmed plegia. NIHSS = 9. CT head showed an unusual looking hyperdensity both in the first and the second scans of the head. I called to discuss the case with radiologist and a request for a 5 second delay exam after a CTA which I also ordered as part of the evaluation. This of course delayed decision for tPA because given the head trauma, even if minor, makes this hyperdensity likely to be a SDH.  Addendum - after long discussion with neuroradiologist it appears that there are blood products in the CT scan that preclude the use of iv TPA in this patient. Her CTA also does not show an acute large vessel occlusion.   LKW: 10pm tpa given?: no   SUBJECTIVE (INTERVAL HISTORY) Her daughter is at the bedside.  Overall she feels her condition is stable. She still complains of b/l hip pain and right shoulder pain. Will do X-ray before PT working with her. Still on nicardipine, will taper off today. Increase po meds if needed.    OBJECTIVE Temp:  [97.5 F (36.4 C)-99 F (37.2 C)] 98.2 F (36.8 C) (04/22 0400) Pulse Rate:  [71-89] 77 (04/22 0700) Cardiac Rhythm:  [-] Normal sinus rhythm (04/21 2000) Resp:  [19-24] 21 (04/22 0700) BP: (117-170)/(45-76) 168/52 mmHg (04/22 0700) SpO2:  [91 %-100 %] 99 % (04/22 0700)  CBC:  Recent Labs Lab 03/06/16 0032 03/06/16 0034 03/07/16 0215  WBC 7.2  --  7.8  NEUTROABS 5.6  --   --   HGB 14.0 15.6* 14.1  HCT 41.3 46.0 42.1  MCV 89.4  --  91.1  PLT 179  --  184    Basic Metabolic Panel:  Recent Labs Lab 03/06/16 0032 03/06/16 0034  03/07/16 0215  NA 136 139 136  K 3.6 3.6 3.9  CL 105 104 105  CO2 19*  --  20*  GLUCOSE 180* 174* 155*  BUN 16 19 10   CREATININE 0.83 0.70 0.82  CALCIUM 9.3  --  8.8*    Lipid Panel:    Component Value Date/Time   CHOL 191 03/07/2016 0215   TRIG 84 03/07/2016 0215   HDL 46 03/07/2016 0215   CHOLHDL 4.2 03/07/2016 0215   VLDL 17 03/07/2016 0215   LDLCALC 128* 03/07/2016 0215   HgbA1c:  Lab Results  Component Value Date   HGBA1C * 03/05/2011    8.0 (NOTE)                                                                       According to the ADA Clinical Practice Recommendations for 2011, when HbA1c is used as a screening test:   >=6.5%   Diagnostic of Diabetes Mellitus           (  if abnormal result  is confirmed)  5.7-6.4%   Increased risk of developing Diabetes Mellitus  References:Diagnosis and Classification of Diabetes Mellitus,Diabetes Care,2011,34(Suppl 1):S62-S69 and Standards of Medical Care in         Diabetes - 2011,Diabetes Care,2011,34  (Suppl 1):S11-S61.   Urine Drug Screen:    Component Value Date/Time   LABOPIA NONE DETECTED 03/06/2016 0038   COCAINSCRNUR NONE DETECTED 03/06/2016 0038   LABBENZ NONE DETECTED 03/06/2016 0038   AMPHETMU NONE DETECTED 03/06/2016 0038   THCU NONE DETECTED 03/06/2016 0038   LABBARB POSITIVE* 03/06/2016 0038      IMAGING I have personally reviewed the radiological images below and agree with the radiology interpretations.  Ct Angio Head W/cm &/or Wo Cm 03/06/2016    CT HEAD: Small acute subdural hematoma along LEFT cerebellar tentorium and LEFT posterior temporal lobe.  Moderate RIGHT scalp hematoma.  No skull fracture. Stable involutional changes and moderate to severe chronic small vessel ischemic disease.   CTA NECK: 80- 90% focal stenosis LEFT internal carotid artery origin. Less than 50% stenosis RIGHT internal carotid artery origin.   CTA HEAD:  No emergent large vessel occlusion. Moderate to severe stenosis bilateral  cavernous internal carotid arteries due to calcific atherosclerosis.   Ct Head Wo Contrast 03/06/2016   Areas of subdural hematoma, stable from earlier in the day. No new foci of hemorrhage. No significant mass effect and no midline shift apparent. Atrophy with small vessel disease is stable. No acute infarct evident.   03/05/2016   No acute intracranial abnormalities. Chronic atrophy and small vessel ischemic changes.  Degenerative changes in the cervical spine.  Normal alignment.  Mr Brain Wo Contrast 03/06/2016   1. Acute cortically based infarcts in the posterior left MCA territory affecting the peri-Rolandic cortex. Gyral edema without associated mass effect or parenchymal hemorrhage here.  2. Small left subdural hematoma most apparent along the medial left occipital lobe. Superimposed small left posterior temporal lobe hemorrhagic contusion is favored over additional subdural blood.  3. Superimposed small acute lacunar infarct in the lateral right thalamus (right thalamus striate/PCA territory). And there might also be a small left superior cerebellar artery territory infarct versus artifact from adjacent blood products. Favor synchronous small vessel disease for these rather than sequelae of recent embolic event.   Dg Knee Complete 4 Views Left 03/05/2016   Sclerosis in the distal left femur consistent with enchondroma or bone infarct. No acute fracture or dislocation.   Dg Shoulder Left Port  03/07/2016  IMPRESSION: No fracture or dislocation.    Dg Shoulder Right Port  03/07/2016  IMPRESSION: No fracture or dislocation.  Dg Hip Port Unilat With Pelvis 1v Left  03/07/2016  IMPRESSION: No fracture dislocation  Dg Hip Port Unilat With Pelvis 1v Right  03/07/2016  IMPRESSION: No pelvic fracture or hip fracture.    LE venous doppler - No evidence of DVT or baker's cyst.  2D echo - - Left ventricle: The cavity size was normal. There was moderate  concentric hypertrophy. Systolic  function was normal. The  estimated ejection fraction was in the range of 60% to 65%. Wall  motion was normal; there were no regional wall motion  abnormalities. Features are consistent with a pseudonormal left  ventricular filling pattern, with concomitant abnormal relaxation  and increased filling pressure (grade 2 diastolic dysfunction).  Doppler parameters are consistent with high ventricular filling  pressure. - Aortic valve: Moderately to severely calcified annulus. Severe  diffuse thickening and calcification. Valve mobility was  restricted. There was mild stenosis. There was trivial  regurgitation. Valve area (VTI): 1.99 cm^2. Valve area (Vmax):  1.82 cm^2. Valve area (Vmean): 1.69 cm^2. - Mitral valve: Calcified annulus. Moderate diffuse thickening and  calcification. Valve area by pressure half-time: 2.39 cm^2. - Left atrium: The atrium was severely dilated. - Atrial septum: No defect or patent foramen ovale was identified. - Pulmonary arteries: PA peak pressure: 67 mm Hg (S). Impressions: - The right ventricular systolic pressure was increased consistent  with moderate pulmonary hypertension.  PHYSICAL EXAM  Temp:  [97.5 F (36.4 C)-99 F (37.2 C)] 98.7 F (37.1 C) (04/22 0800) Pulse Rate:  [71-89] 79 (04/22 0800) Resp:  [19-24] 21 (04/22 0800) BP: (117-170)/(45-76) 155/50 mmHg (04/22 0800) SpO2:  [91 %-100 %] 97 % (04/22 0800)  General - Well nourished, well developed, mild distress due to hip pain and shoulder pain.  Ophthalmologic - Fundi not visualized due to noncooperation.  Cardiovascular - Regular rate and rhythm.  Mental Status -  Awake, alert, orientated to people and self, but not orientated to place, time or age. Language including expression, naming 2/2, repetition, comprehension was assessed and found intact.  Cranial Nerves II - XII - II - Visual field intact OU. III, IV, VI - Extraocular movements intact. V - Facial sensation intact  bilaterally. VII - Facial movement intact bilaterally. VIII - Hearing & vestibular intact bilaterally. X - Palate elevates symmetrically. XI - Chin turning & shoulder shrug intact bilaterally. XII - Tongue protrusion intact.  Motor Strength - The patient's strength was 0/5 RUE and 3-/5 RLE, 5/5 LUE and 4+/5 LLE due to hip pain.  Bulk was normal and fasciculations were absent.   Motor Tone - Muscle tone was assessed at the neck and appendages and was decreased at RUE.  Reflexes - The patient's reflexes were 1+ in all extremities and she had no pathological reflexes.  Sensory - not cooperative.    Coordination - not cooperative.  Tremor was absent.  Gait and Station - not tested.   ASSESSMENT/PLAN Ms. [name] Colon is a 82 y.o. female with history of hypertension, diabetes mellitus, thyroid disease, and claudication presenting with a recent fall with head injury. While in ED, developed right hemiplegia. CT showed small left tentorial SDH. She did not receive IV t-PA due to a subdural hematoma.  Stroke: Left MCA territory infarcts, but also left MCA/PCA, right thalamus and left SCA small infarcts, etiology not clear, but may be due to rapid BP lowering in the setting of high grade stenosis of left ICA and multifocal moderate to severe atherosclerosis. Afib still in DDx  Resultant  right hemiplegia  MRI - Acute cortically based infarcts in the posterior left MCA territory affecting the peri-Rolandic cortex, as well as punctate infarcts in left MCA/PCA, right thalamus and left SCA..  CTA of the neck - 80- 90% focal stenosis LEFT ICA origin, and moderate to severe stenosis bilateral cavernous ICA due to calcific atherosclerosis.  2D Echo - EF 60-65%  Bilateral lower extremity venous duplex - negative for DVT  Recommend outpt 30 day cardiac event monitoring to rule out afib  LDL - 128  HgbA1c pending  VTE prophylaxis - SCDs  Diet regular Room service appropriate?: Yes; Fluid  consistency:: Thin  No antithrombotic prior to admission. Repeat CT showed stable SDH, will start ASA supporsitory.  Ongoing aggressive stroke risk factor management  Therapy recommendations: Pending  Disposition: Pending  Small left temporoparietal SDH - traumatic  Due to fall  Stable on repeat CT  Ok to start ASA for stroke prevention  Carotid stenosis, left  CTA showed left ICA 80-90%  Pt has left MCA infarct, likely due to rapid lowering BP  Will have vascular surgery consult  May consider left CEA after stabilization  Hypertension  Stable now  On cardene, will taper off  On admission BP as high as 269/97, and down to 144/47 in 3 hours  Permissive hypertension (OK if < 220/120) but gradually normalize in 5-7 days  On coreg and clonidine  Increase po BP meds if needed.  Hyperlipidemia  Home meds: No lipid lowering medications prior to admission  LDL 128, goal < 70  Add Lipitor 40 mg daily  Continue statin at discharge  Diabetes  HgbA1c pending, goal < 7.0  SSI  CBG monitoring  Other Stroke Risk Factors  Advanced age  Carotid artery disease  Other Active Problems  Scalp laceration  Hospital day # 1  This patient is critically ill due to left ICA high grade stenosis and left MCA infarct and SDH after fall and at significant risk of neurological worsening, death form recurrent stroke, enlargement of SDH, hypertensive emergency. This patient's care requires constant monitoring of vital signs, hemodynamics, respiratory and cardiac monitoring, review of multiple databases, neurological assessment, discussion with family, other specialists and medical decision making of high complexity. I spent 40 minutes of neurocritical care time in the care of this patient.  Marvel PlanJindong Vieva Brummitt, MD PhD Stroke Neurology 03/07/2016 5:08 PM    To contact Stroke Continuity provider, please refer to [email or web address]. After hours, contact General Neurology

## 2016-03-08 DIAGNOSIS — I6522 Occlusion and stenosis of left carotid artery: Secondary | ICD-10-CM

## 2016-03-08 DIAGNOSIS — I6350 Cerebral infarction due to unspecified occlusion or stenosis of unspecified cerebral artery: Secondary | ICD-10-CM

## 2016-03-08 DIAGNOSIS — I6523 Occlusion and stenosis of bilateral carotid arteries: Secondary | ICD-10-CM

## 2016-03-08 LAB — CBC
HEMATOCRIT: 41.4 % (ref 36.0–46.0)
HEMOGLOBIN: 13.5 g/dL (ref 12.0–15.0)
MCH: 29.6 pg (ref 26.0–34.0)
MCHC: 32.6 g/dL (ref 30.0–36.0)
MCV: 90.8 fL (ref 78.0–100.0)
Platelets: 171 10*3/uL (ref 150–400)
RBC: 4.56 MIL/uL (ref 3.87–5.11)
RDW: 13.6 % (ref 11.5–15.5)
WBC: 6.5 10*3/uL (ref 4.0–10.5)

## 2016-03-08 LAB — GLUCOSE, CAPILLARY
GLUCOSE-CAPILLARY: 162 mg/dL — AB (ref 65–99)
Glucose-Capillary: 171 mg/dL — ABNORMAL HIGH (ref 65–99)
Glucose-Capillary: 132 mg/dL — ABNORMAL HIGH (ref 65–99)
Glucose-Capillary: 124 mg/dL — ABNORMAL HIGH (ref 65–99)

## 2016-03-08 LAB — BASIC METABOLIC PANEL
Anion gap: 12 (ref 5–15)
BUN: 13 mg/dL (ref 6–20)
CALCIUM: 8.6 mg/dL — AB (ref 8.9–10.3)
CHLORIDE: 102 mmol/L (ref 101–111)
CO2: 21 mmol/L — AB (ref 22–32)
CREATININE: 0.76 mg/dL (ref 0.44–1.00)
GFR calc non Af Amer: >60 mL/min (ref >60)
GLUCOSE: 135 mg/dL — AB (ref 65–99)
Potassium: 4 mmol/L (ref 3.5–5.1)
Sodium: 135 mmol/L (ref 135–145)

## 2016-03-08 MED ORDER — HYDRALAZINE HCL 20 MG/ML IJ SOLN
10.0000 mg | INTRAMUSCULAR | Status: DC | PRN
  Administered 2016-03-08 – 2016-03-10 (×2): 10 mg via INTRAVENOUS
  Filled 2016-03-08 (×2): qty 1

## 2016-03-08 MED ORDER — WHITE PETROLATUM GEL
Status: AC
  Administered 2016-03-08: 0.2
  Filled 2016-03-08: qty 1

## 2016-03-08 MED ORDER — CLONIDINE HCL 0.2 MG PO TABS
0.2000 mg | ORAL_TABLET | Freq: Three times a day (TID) | ORAL | Status: DC
  Administered 2016-03-08: 0.1 mg via ORAL
  Administered 2016-03-08 – 2016-03-10 (×7): 0.2 mg via ORAL
  Filled 2016-03-08: qty 2
  Filled 2016-03-08 (×3): qty 1
  Filled 2016-03-08: qty 2
  Filled 2016-03-08 (×3): qty 1

## 2016-03-08 NOTE — Progress Notes (Signed)
STROKE TEAM PROGRESS NOTE   SUBJECTIVE (INTERVAL HISTORY) Her RN is at the bedside.  She is sitting in chair without distress. Still complains of right shoulder and right hip pain on moving arm and leg. X-ray showed no fracture or dislocation. off nicardipine this am, BP better conrolled. Increase clonidine dose. Will have [facility] consult for left ICA stenosis.   OBJECTIVE Temp:  [98 F (36.7 C)-99.2 F (37.3 C)] 99.2 F (37.3 C) (04/23 0800) Pulse Rate:  [58-83] 65 (04/23 1200) Cardiac Rhythm:  [-] Normal sinus rhythm (04/23 0800) Resp:  [14-24] 18 (04/23 1200) BP: (108-208)/(40-98) 132/67 mmHg (04/23 1200) SpO2:  [95 %-100 %] 99 % (04/23 1200)  CBC:  Recent Labs Lab 03/06/16 0032  03/07/16 0215 03/08/16 0356  WBC 7.2  --  7.8 6.5  NEUTROABS 5.6  --   --   --   HGB 14.0  < > 14.1 13.5  HCT 41.3  < > 42.1 41.4  MCV 89.4  --  91.1 90.8  PLT 179  --  184 171  < > = values in this interval not displayed.  Basic Metabolic Panel:   Recent Labs Lab 03/07/16 0215 03/08/16 0356  NA 136 135  K 3.9 4.0  CL 105 102  CO2 20* 21*  GLUCOSE 155* 135*  BUN 10 13  CREATININE 0.82 0.76  CALCIUM 8.8* 8.6*    Lipid Panel:     Component Value Date/Time   CHOL 191 03/07/2016 0215   TRIG 84 03/07/2016 0215   HDL 46 03/07/2016 0215   CHOLHDL 4.2 03/07/2016 0215   VLDL 17 03/07/2016 0215   LDLCALC 128* 03/07/2016 0215   HgbA1c:  Lab Results  Component Value Date   HGBA1C * 03/05/2011    8.0 (NOTE)                                                                       According to the ADA Clinical Practice Recommendations for 2011, when HbA1c is used as a screening test:   >=6.5%   Diagnostic of Diabetes Mellitus           (if abnormal result  is confirmed)  5.7-6.4%   Increased risk of developing Diabetes Mellitus  References:Diagnosis and Classification of Diabetes Mellitus,Diabetes Care,2011,34(Suppl 1):S62-S69 and Standards of Medical Care in         Diabetes - 2011,Diabetes  Care,2011,34  (Suppl 1):S11-S61.   Urine Drug Screen:     Component Value Date/Time   LABOPIA NONE DETECTED 03/06/2016 0038   COCAINSCRNUR NONE DETECTED 03/06/2016 0038   LABBENZ NONE DETECTED 03/06/2016 0038   AMPHETMU NONE DETECTED 03/06/2016 0038   THCU NONE DETECTED 03/06/2016 0038   LABBARB POSITIVE* 03/06/2016 0038      IMAGING I have personally reviewed the radiological images below and agree with the radiology interpretations.  Ct Angio Head W/cm &/or Wo Cm 03/06/2016    CT HEAD: Small acute subdural hematoma along LEFT cerebellar tentorium and LEFT posterior temporal lobe.  Moderate RIGHT scalp hematoma.  No skull fracture. Stable involutional changes and moderate to severe chronic small vessel ischemic disease.   CTA NECK: 80- 90% focal stenosis LEFT internal carotid artery origin. Less than 50% stenosis RIGHT internal carotid artery origin.  CTA HEAD:  No emergent large vessel occlusion. Moderate to severe stenosis bilateral cavernous internal carotid arteries due to calcific atherosclerosis.   Ct Head Wo Contrast 03/06/2016   Areas of subdural hematoma, stable from earlier in the day. No new foci of hemorrhage. No significant mass effect and no midline shift apparent. Atrophy with small vessel disease is stable. No acute infarct evident.   03/05/2016   No acute intracranial abnormalities. Chronic atrophy and small vessel ischemic changes.  Degenerative changes in the cervical spine.  Normal alignment.  Mr Brain Wo Contrast 03/06/2016   1. Acute cortically based infarcts in the posterior left MCA territory affecting the peri-Rolandic cortex. Gyral edema without associated mass effect or parenchymal hemorrhage here.  2. Small left subdural hematoma most apparent along the medial left occipital lobe. Superimposed small left posterior temporal lobe hemorrhagic contusion is favored over additional subdural blood.  3. Superimposed small acute lacunar infarct in the lateral  right thalamus (right thalamus striate/PCA territory). And there might also be a small left superior cerebellar artery territory infarct versus artifact from adjacent blood products. Favor synchronous small vessel disease for these rather than sequelae of recent embolic event.   Dg Knee Complete 4 Views Left 03/05/2016   Sclerosis in the distal left femur consistent with enchondroma or bone infarct. No acute fracture or dislocation.   Dg Shoulder Left Port  03/07/2016  IMPRESSION: No fracture or dislocation.    Dg Shoulder Right Port  03/07/2016  IMPRESSION: No fracture or dislocation.  Dg Hip Port Unilat With Pelvis 1v Left  03/07/2016  IMPRESSION: No fracture dislocation  Dg Hip Port Unilat With Pelvis 1v Right  03/07/2016  IMPRESSION: No pelvic fracture or hip fracture.    LE venous doppler - No evidence of DVT or baker's cyst.  2D echo - - Left ventricle: The cavity size was normal. There was moderate  concentric hypertrophy. Systolic function was normal. The  estimated ejection fraction was in the range of 60% to 65%. Wall  motion was normal; there were no regional wall motion  abnormalities. Features are consistent with a pseudonormal left  ventricular filling pattern, with concomitant abnormal relaxation  and increased filling pressure (grade 2 diastolic dysfunction).  Doppler parameters are consistent with high ventricular filling  pressure. - Aortic valve: Moderately to severely calcified annulus. Severe  diffuse thickening and calcification. Valve mobility was  restricted. There was mild stenosis. There was trivial  regurgitation. Valve area (VTI): 1.99 cm^2. Valve area (Vmax):  1.82 cm^2. Valve area (Vmean): 1.69 cm^2. - Mitral valve: Calcified annulus. Moderate diffuse thickening and  calcification. Valve area by pressure half-time: 2.39 cm^2. - Left atrium: The atrium was severely dilated. - Atrial septum: No defect or patent foramen ovale was  identified. - Pulmonary arteries: PA peak pressure: 67 mm Hg (S). Impressions: - The right ventricular systolic pressure was increased consistent  with moderate pulmonary hypertension.  PHYSICAL EXAM  Temp:  [98 F (36.7 C)-99.2 F (37.3 C)] 99.2 F (37.3 C) (04/23 0800) Pulse Rate:  [58-83] 65 (04/23 1200) Resp:  [14-24] 18 (04/23 1200) BP: (108-208)/(40-98) 132/67 mmHg (04/23 1200) SpO2:  [95 %-100 %] 99 % (04/23 1200)  General - Well nourished, well developed, mild distress due to hip pain and shoulder pain.  Ophthalmologic - Fundi not visualized due to noncooperation.  Cardiovascular - Regular rate and rhythm.  Mental Status -  Awake, alert, orientated to people and self, but not orientated to place, time or age. Language including expression,  naming 2/2, repetition, comprehension was assessed and found intact.  Cranial Nerves II - XII - II - Visual field intact OU. III, IV, VI - Extraocular movements intact. V - Facial sensation intact bilaterally. VII - Facial movement intact bilaterally. VIII - Hearing & vestibular intact bilaterally. X - Palate elevates symmetrically. XI - Chin turning & shoulder shrug intact bilaterally. XII - Tongue protrusion intact.  Motor Strength - The patient's strength was 0/5 RUE and 3-/5 RLE in the setting of right shoulder and hip pain on moving, 5/5 LUE and 4+/5 LLE due to left hip pain.  Bulk was normal and fasciculations were absent.   Motor Tone - Muscle tone was assessed at the neck and appendages and was decreased at RUE.  Reflexes - The patient's reflexes were 1+ in all extremities and she had no pathological reflexes.  Sensory - symmetrical bilaterally but pain on b/l hip and right shoulder.    Coordination - no ataxia or dysmetria on the left hand.  Tremor was absent.  Gait and Station - not tested due to safety concerns.   ASSESSMENT/PLAN Ms. [name] Colon is a 82 y.o. female with history of hypertension, diabetes  mellitus, thyroid disease, and claudication presenting with a recent fall with head injury. While in ED, developed right hemiplegia. CT showed small left tentorial SDH. She did not receive IV t-PA due to a subdural hematoma.  Stroke: Left MCA territory infarcts, but also left MCA/PCA, right thalamus and left SCA small infarcts, etiology not clear, but may be due to rapid BP lowering in the setting of high grade stenosis of left ICA and multifocal moderate to severe atherosclerosis. Afib still in DDx  Resultant  right hemiplegia  MRI - Acute cortically based infarcts in the posterior left MCA territory affecting the peri-Rolandic cortex, as well as punctate infarcts in left MCA/PCA, right thalamus and left SCA..  CTA of the neck - 80- 90% focal stenosis LEFT ICA origin, and moderate to severe stenosis bilateral cavernous ICA due to calcific atherosclerosis.  2D Echo - EF 60-65%  Bilateral lower extremity venous duplex - negative for DVT  Recommend outpt 30 day cardiac event monitoring to rule out afib  LDL - 128  HgbA1c pending  VTE prophylaxis - SCDs Diet regular Room service appropriate?: Yes; Fluid consistency:: Thin  No antithrombotic prior to admission. Repeat CT showed stable SDH, will start ASA supporsitory. Pt refused to take ASA po as ASA causing her upset stomach.   Ongoing aggressive stroke risk factor management  Therapy recommendations: Pending  Disposition: Pending  Small left temporoparietal SDH - traumatic  Due to fall  Stable on repeat CT  Ok to start ASA for stroke prevention  Carotid stenosis, left  CTA showed left ICA 80-90%  Pt has left MCA infarct, likely due to rapid lowering BP  vascular surgery consult pending  May consider left CEA after stabilization  Hypertension  Better controlled but still intermittently high  Off cardene  On admission BP as high as 269/97, and down to 144/47 in 3 hours Permissive hypertension (OK if < 220/120) but  gradually normalize in 5-7 days On coreg and clonidine, increase clonidine to 0.2 tid  Hyperlipidemia  Home meds: No lipid lowering medications prior to admission  LDL 128, goal < 70  Add Lipitor 40 mg daily  Continue statin at discharge  Diabetes  HgbA1c pending, goal < 7.0  SSI  CBG monitoring  Other Stroke Risk Factors  Advanced age  Carotid artery disease  Other Active Problems  Scalp laceration  Hospital day # 2  This patient is critically ill due to left ICA high grade stenosis and left MCA infarct and SDH after fall and at significant risk of neurological worsening, death form recurrent stroke, enlargement of SDH, hypertensive emergency. This patient's care requires constant monitoring of vital signs, hemodynamics, respiratory and cardiac monitoring, review of multiple databases, neurological assessment, discussion with family, other specialists and medical decision making of high complexity. I spent 40 minutes of neurocritical care time in the care of this patient.  Marvel Plan, MD PhD Stroke Neurology 03/08/2016 1:38 PM    To contact Stroke Continuity provider, please refer to [email or web address]. After hours, contact General Neurology

## 2016-03-08 NOTE — Consult Note (Signed)
Referring Physician: Dr Roda Shutters Patient name: [name] Colon MRN: [id number] DOB: 06-Dec-1919 Sex: female  REASON FOR CONSULT: symptomatic carotid stenosis  HPI: [name] Colon is a 82 y.o. female, s/p left brain stroke 3 days ago.  She was noted on CT to have subdural hematoma and scalp hematoma from a fall as well.  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  She is able to talk and according to her daughter was functioning independently prior to this event. Other medical problems include hypertension and diabetes.  Her blood pressure was poorly controlled on admission but is better now.  Past Medical History  Diagnosis Date  . Hypertension   . Diabetes mellitus   . Thyroid disease   . Claudication [facility])    Past Surgical History  Procedure Laterality Date  . Appendectomy    . Tonsillectomy    . Cataract extraction      Family History  Problem Relation Age of Onset  . Heart failure Mother   . Lung disease Father     Black Lung disease    SOCIAL HISTORY: Social History   Social History  . Marital Status: Widowed    Spouse Name: N/A  . Number of Children: N/A  . Years of Education: N/A   Occupational History  . Not on file.   Social History Main Topics  . Smoking status: Never Smoker   . Smokeless tobacco: Not on file  . Alcohol Use: No  . Drug Use: No  . Sexual Activity: Not on file   Other Topics Concern  . Not on file   Social History Narrative    Allergies  Allergen Reactions  . Doxycycline Anaphylaxis  . Alendronate Sodium Rash  . Losartan Potassium Rash  . Sulfa Antibiotics Rash  . Aspirin Nausea And Vomiting  . Penicillins Rash    Current Facility-Administered Medications  Medication Dose Route Frequency Provider Last Rate Last Dose  . acetaminophen (TYLENOL) tablet 650 mg  650 mg Oral Q4H PRN Heron Sabins, MD       Or  . acetaminophen (TYLENOL) suppository 650 mg  650 mg Rectal Q4H PRN Heron Sabins, MD      . aspirin suppository 300 mg  300 mg  Rectal Daily Marvel Plan, MD   300 mg at 03/08/16 1045  . atorvastatin (LIPITOR) tablet 40 mg  40 mg Oral q1800 David L Rinehuls, PA-C   40 mg at 03/07/16 1843  . carvedilol (COREG) tablet 6.25 mg  6.25 mg Oral BID WC Heron Sabins, MD   6.25 mg at 03/08/16 0804  . cloNIDine (CATAPRES) tablet 0.2 mg  0.2 mg Oral TID Marvel Plan, MD   0.1 mg at 03/08/16 1045  . heparin injection 5,000 Units  5,000 Units Subcutaneous Q12H Marvel Plan, MD   5,000 Units at 03/08/16 1044  . hydrALAZINE (APRESOLINE) injection 10 mg  10 mg Intravenous Q4H PRN Heron Sabins, MD   10 mg at 03/08/16 0520  . insulin aspart (novoLOG) injection 0-15 Units  0-15 Units Subcutaneous TID WC Marvel Plan, MD   3 Units at 03/08/16 0831  . levothyroxine (SYNTHROID, LEVOTHROID) tablet 75 mcg  75 mcg Oral QAC breakfast Heron Sabins, MD   75 mcg at 03/08/16 0804  . nicardipine (CARDENE)  in 0.86% saline IV infusion (0.1 mg/ml)  3-15 mg/hr Intravenous Continuous Tomasita Crumble, MD   Stopped at 03/08/16 0800  . ondansetron (ZOFRAN) injection 4 mg  4 mg Intravenous Q6H PRN Briscoe Deutscher, MD  4 mg at 03/08/16 0607  . pantoprazole (PROTONIX) EC tablet 40 mg  40 mg Oral QHS Bertram MillardMichael A Maccia, RPH   40 mg at 03/07/16 2126  . primidone (MYSOLINE) tablet 250 mg  250 mg Oral QHS Heron SabinsJose Vega, MD   250 mg at 03/07/16 2127  . senna-docusate (Senokot-S) tablet 1 tablet  1 tablet Oral BID Heron SabinsJose Vega, MD   1 tablet at 03/08/16 1045  . white petrolatum (VASELINE) gel             ROS:   General:  No weight loss, Fever, chills  HEENT: No recent headaches, no nasal bleeding, no visual changes, no sore throat  Neurologic: No dizziness, blackouts, seizures. No recent symptoms of stroke or mini- stroke. No recent episodes of slurred speech, or temporary blindness.  Cardiac: No recent episodes of chest pain/pressure, no shortness of breath at rest.  No shortness of breath with exertion.  Denies history of atrial fibrillation or irregular heartbeat  Vascular: No  history of rest pain in feet.  No history of claudication.  No history of non-healing ulcer, No history of DVT   Pulmonary: No home oxygen, no productive cough, no hemoptysis,  No asthma or wheezing  Musculoskeletal:  [ ]  Arthritis, [ ]  Low back pain,  [ ]  Joint pain  Hematologic:No history of hypercoagulable state.  No history of easy bleeding.  No history of anemia  Gastrointestinal: No hematochezia or melena,  No gastroesophageal reflux, no trouble swallowing  Urinary: [ ]  chronic Kidney disease, [ ]  on HD - [ ]  MWF or [ ]  TTHS, [ ]  Burning with urination, [ ]  Frequent urination, [ ]  Difficulty urinating;   Skin: No rashes  Psychological: No history of anxiety,  No history of depression   Physical Examination  Filed Vitals:   03/08/16 0900 03/08/16 1000 03/08/16 1100 03/08/16 1200  BP: 142/50 153/55 153/98 132/67  Pulse: 76 77 73 65  Temp:      TempSrc:      Resp: 21 24 19 18   Height:      Weight:      SpO2: 98% 99% 99% 99%    Body mass index is 27.08 kg/(m^2).  General:  Alert and oriented, no acute distress HEENT: Normal Neck: No JVD Pulmonary: Clear to auscultation bilaterally Cardiac: Regular Rate and Rhythm  Abdomen: Soft, non-tender, non-distended, no mass Skin: No rash Extremity Pulses:  2+ radial, brachial pulses bilaterally Musculoskeletal: No deformity or edema  Neurologic: Upper and lower extremity motor 0/5 right side with right facial droop left side 5/5 upper and lower extremity strength  DATA:  CT head subdural and acute left brain infarct.  80% left ICA, 50% right ICA MRI brain subdural, large area of parietal and frontal lobe infract  ASSESSMENT:  Pt with right hemiplegia significant large deficit post stroke.  Although she does have a left carotid stenosis, she also has several reasons currently precluding carotid endarterectomy.    1. Her current functional status is quite poor with complete hemiplegia.  In a pt 82 years old this may not allow  much recovery.  2. She also has combined subdural hematoma which will limit the use of antiplatelet and other anticoagulants necessary for a carotid intervention.  Pt can see me as an outpt in 4-6 weeks to assess her functional status and quality of life to consider whether risk of CEA is worth benefits.  Plan discussed with pt and her daughter.  Fabienne Brunsharles Fields, MD [facility] [place] Office: [phone] Pager: [phone]    PLAN:  See above   Fabienne Bruns, MD [facility] [place] Office: [phone] Pager: [phone]

## 2016-03-09 ENCOUNTER — Other Ambulatory Visit: Payer: Self-pay | Admitting: *Deleted

## 2016-03-09 ENCOUNTER — Telehealth: Payer: Self-pay | Admitting: Vascular Surgery

## 2016-03-09 ENCOUNTER — Inpatient Hospital Stay (HOSPITAL_COMMUNITY): Payer: Medicare Other

## 2016-03-09 DIAGNOSIS — E039 Hypothyroidism, unspecified: Secondary | ICD-10-CM

## 2016-03-09 DIAGNOSIS — E119 Type 2 diabetes mellitus without complications: Secondary | ICD-10-CM | POA: Insufficient documentation

## 2016-03-09 DIAGNOSIS — I639 Cerebral infarction, unspecified: Secondary | ICD-10-CM

## 2016-03-09 DIAGNOSIS — S069XAA Unspecified intracranial injury with loss of consciousness status unknown, initial encounter: Secondary | ICD-10-CM | POA: Insufficient documentation

## 2016-03-09 DIAGNOSIS — S069X9A Unspecified intracranial injury with loss of consciousness of unspecified duration, initial encounter: Secondary | ICD-10-CM | POA: Insufficient documentation

## 2016-03-09 DIAGNOSIS — I6330 Cerebral infarction due to thrombosis of unspecified cerebral artery: Secondary | ICD-10-CM

## 2016-03-09 DIAGNOSIS — F068 Other specified mental disorders due to known physiological condition: Secondary | ICD-10-CM

## 2016-03-09 DIAGNOSIS — S069XAS Unspecified intracranial injury with loss of consciousness status unknown, sequela: Secondary | ICD-10-CM | POA: Insufficient documentation

## 2016-03-09 DIAGNOSIS — I6381 Other cerebral infarction due to occlusion or stenosis of small artery: Secondary | ICD-10-CM | POA: Insufficient documentation

## 2016-03-09 DIAGNOSIS — I6200 Nontraumatic subdural hemorrhage, unspecified: Secondary | ICD-10-CM

## 2016-03-09 DIAGNOSIS — I272 Other secondary pulmonary hypertension: Secondary | ICD-10-CM

## 2016-03-09 DIAGNOSIS — I6523 Occlusion and stenosis of bilateral carotid arteries: Secondary | ICD-10-CM

## 2016-03-09 DIAGNOSIS — I10 Essential (primary) hypertension: Secondary | ICD-10-CM | POA: Insufficient documentation

## 2016-03-09 DIAGNOSIS — I5032 Chronic diastolic (congestive) heart failure: Secondary | ICD-10-CM | POA: Insufficient documentation

## 2016-03-09 DIAGNOSIS — S069X1D Unspecified intracranial injury with loss of consciousness of 30 minutes or less, subsequent encounter: Secondary | ICD-10-CM

## 2016-03-09 DIAGNOSIS — S069X0S Unspecified intracranial injury without loss of consciousness, sequela: Secondary | ICD-10-CM

## 2016-03-09 LAB — CBC
HCT: 41.1 % (ref 36.0–46.0)
HEMOGLOBIN: 13.5 g/dL (ref 12.0–15.0)
MCH: 29.7 pg (ref 26.0–34.0)
MCHC: 32.8 g/dL (ref 30.0–36.0)
MCV: 90.5 fL (ref 78.0–100.0)
PLATELETS: 181 10*3/uL (ref 150–400)
RBC: 4.54 MIL/uL (ref 3.87–5.11)
RDW: 13.6 % (ref 11.5–15.5)
WBC: 5.8 10*3/uL (ref 4.0–10.5)

## 2016-03-09 LAB — GLUCOSE, CAPILLARY
GLUCOSE-CAPILLARY: 160 mg/dL — AB (ref 65–99)
GLUCOSE-CAPILLARY: 108 mg/dL — AB (ref 65–99)
Glucose-Capillary: 130 mg/dL — ABNORMAL HIGH (ref 65–99)
Glucose-Capillary: 140 mg/dL — ABNORMAL HIGH (ref 65–99)
Glucose-Capillary: 212 mg/dL — ABNORMAL HIGH (ref 65–99)

## 2016-03-09 LAB — BASIC METABOLIC PANEL
ANION GAP: 13 (ref 5–15)
BUN: 19 mg/dL (ref 6–20)
CALCIUM: 8.6 mg/dL — AB (ref 8.9–10.3)
CHLORIDE: 103 mmol/L (ref 101–111)
CO2: 20 mmol/L — ABNORMAL LOW (ref 22–32)
CREATININE: 0.78 mg/dL (ref 0.44–1.00)
GFR calc Af Amer: >60 mL/min (ref >60)
GFR calc non Af Amer: >60 mL/min (ref >60)
GLUCOSE: 134 mg/dL — AB (ref 65–99)
POTASSIUM: 4.2 mmol/L (ref 3.5–5.1)
SODIUM: 136 mmol/L (ref 135–145)

## 2016-03-09 LAB — HEMOGLOBIN A1C
Hgb A1c MFr Bld: 7.5 % — ABNORMAL HIGH (ref 4.8–5.6)
MEAN PLASMA GLUCOSE: 169 mg/dL

## 2016-03-09 NOTE — Care Management Note (Addendum)
Case Management Note  Patient Details  Name: [name] Colon MRN: [id number] Date of Birth: 05/07/1920  Subjective/Objective:                 Spoke with patient in the room. CIR rec by PT. Patient states that she expects to go to rehab at discharge, either CIR or SNF. Patient currently lives at home with son. CSW updated to evaluate for SNF backup to CIR.   Action/Plan:  Anticipate DC to CIR or SNF when medically stable. Will follow CSW for plan.  Addendum 03/10/16- Will DC to CIR  Expected Discharge Date:                  Expected Discharge Plan:  Skilled Nursing Facility  In-House Referral:  Clinical Social Work  Discharge planning Services  CM Consult  Post Acute Care Choice:    Choice offered to:     DME Arranged:    DME Agency:     HH Arranged:    HH Agency:     Status of Service:  In process, will continue to follow  Medicare Important Message Given:  Yes Date Medicare IM Given:    Medicare IM give by:    Date Additional Medicare IM Given:    Additional Medicare Important Message give by:     If discussed at Long Length of Stay Meetings, dates discussed:    Additional Comments:  Lawerance SabalDebbie Elly Haffey, RN 03/09/2016, 1:09 PM

## 2016-03-09 NOTE — Progress Notes (Signed)
STROKE TEAM PROGRESS NOTE   SUBJECTIVE (INTERVAL HISTORY) Patient up in chair. No complaints. Stable overnight.   OBJECTIVE Temp:  [97.8 F (36.6 C)-98.7 F (37.1 C)] 98 F (36.7 C) (04/24 3086) Pulse Rate:  [56-73] 67 (04/24 5784) Cardiac Rhythm:  [-] Normal sinus rhythm (04/24 0700) Resp:  [18-23] 18 (04/24 0638) BP: (132-189)/(35-98) 189/49 mmHg (04/24 0638) SpO2:  [92 %-99 %] 96 % (04/24 6962) Weight:  [61.6 kg (135 lb 12.9 oz)] 61.6 kg (135 lb 12.9 oz) (04/23 1711)  CBC:  Recent Labs Lab 03/06/16 0032  03/08/16 0356 03/09/16 0532  WBC 7.2  < > 6.5 5.8  NEUTROABS 5.6  --   --   --   HGB 14.0  < > 13.5 13.5  HCT 41.3  < > 41.4 41.1  MCV 89.4  < > 90.8 90.5  PLT 179  < > 171 181  < > = values in this interval not displayed.  Basic Metabolic Panel:   Recent Labs Lab 03/08/16 0356 03/09/16 0532  NA 135 136  K 4.0 4.2  CL 102 103  CO2 21* 20*  GLUCOSE 135* 134*  BUN 13 19  CREATININE 0.76 0.78  CALCIUM 8.6* 8.6*    Lipid Panel:     Component Value Date/Time   CHOL 191 03/07/2016 0215   TRIG 84 03/07/2016 0215   HDL 46 03/07/2016 0215   CHOLHDL 4.2 03/07/2016 0215   VLDL 17 03/07/2016 0215   LDLCALC 128* 03/07/2016 0215   HgbA1c:  Lab Results  Component Value Date   HGBA1C 7.5* 03/07/2016   Urine Drug Screen:     Component Value Date/Time   LABOPIA NONE DETECTED 03/06/2016 0038   COCAINSCRNUR NONE DETECTED 03/06/2016 0038   LABBENZ NONE DETECTED 03/06/2016 0038   AMPHETMU NONE DETECTED 03/06/2016 0038   THCU NONE DETECTED 03/06/2016 0038   LABBARB POSITIVE* 03/06/2016 0038      IMAGING  Ct Angio Head W/cm &/or Wo Cm 03/06/2016    CT HEAD: Small acute subdural hematoma along LEFT cerebellar tentorium and LEFT posterior temporal lobe.  Moderate RIGHT scalp hematoma.  No skull fracture. Stable involutional changes and moderate to severe chronic small vessel ischemic disease.   CTA NECK: 80- 90% focal stenosis LEFT internal carotid artery  origin. Less than 50% stenosis RIGHT internal carotid artery origin.   CTA HEAD:  No emergent large vessel occlusion. Moderate to severe stenosis bilateral cavernous internal carotid arteries due to calcific atherosclerosis.   Ct Head Wo Contrast 03/06/2016   Areas of subdural hematoma, stable from earlier in the day. No new foci of hemorrhage. No significant mass effect and no midline shift apparent. Atrophy with small vessel disease is stable. No acute infarct evident.   03/05/2016   No acute intracranial abnormalities. Chronic atrophy and small vessel ischemic changes.  Degenerative changes in the cervical spine.  Normal alignment.  Mr Brain Wo Contrast 03/06/2016   1. Acute cortically based infarcts in the posterior left MCA territory affecting the peri-Rolandic cortex. Gyral edema without associated mass effect or parenchymal hemorrhage here.  2. Small left subdural hematoma most apparent along the medial left occipital lobe. Superimposed small left posterior temporal lobe hemorrhagic contusion is favored over additional subdural blood.  3. Superimposed small acute lacunar infarct in the lateral right thalamus (right thalamus striate/PCA territory). And there might also be a small left superior cerebellar artery territory infarct versus artifact from adjacent blood products. Favor synchronous small vessel disease for these rather than sequelae  of recent embolic event.   Dg Knee Complete 4 Views Left 03/05/2016   Sclerosis in the distal left femur consistent with enchondroma or bone infarct. No acute fracture or dislocation.   Dg Shoulder Left Port 03/07/2016  IMPRESSION: No fracture or dislocation.    Dg Shoulder Right Port 03/07/2016  IMPRESSION: No fracture or dislocation.  Dg Hip Port Unilat With Pelvis 1v Left 03/07/2016  IMPRESSION: No fracture dislocation  Dg Hip Port Unilat With Pelvis 1v Right 03/07/2016  IMPRESSION: No pelvic fracture or hip fracture.    LE venous doppler - No  evidence of DVT or baker's cyst.  2D echo  - Left ventricle: The cavity size was normal. There was moderate concentric hypertrophy. Systolic function was normal. The estimated ejection fraction was in the range of 60% to 65%. Wall motion was normal; there were no regional wall motion abnormalities. Features are consistent with a pseudonormal left ventricular filling pattern, with concomitant abnormal relaxation and increased filling pressure (grade 2 diastolic dysfunction). Doppler parameters are consistent with high ventricular filling  pressure. - Aortic valve: Moderately to severely calcified annulus. Severe diffuse thickening and calcification. Valve mobility was restricted. There was mild stenosis. There was trivial regurgitation. Valve area (VTI): 1.99 cm^2. Valve area (Vmax): 1.82 cm^2. Valve area (Vmean): 1.69 cm^2. - Mitral valve: Calcified annulus. Moderate diffuse thickening and calcification. Valve area by pressure half-time: 2.39 cm^2. - Left atrium: The atrium was severely dilated. - Atrial septum: No defect or patent foramen ovale was identified. - Pulmonary arteries: PA peak pressure: 67 mm Hg (S). Impressions:   The right ventricular systolic pressure was increased consistent with moderate pulmonary hypertension.  CUS - 1-39% right ICA plaquing. 40-59% left ICA stenosis, highest end of scale, however, based on plaque formation and ICA/CCA ratio, cannot rule out a higher grade stenosis. Vertebral artery flow is antegrade.    PHYSICAL EXAM Temp:  [97.8 F (36.6 C)-98.7 F (37.1 C)] 98 F (36.7 C) (04/24 1610) Pulse Rate:  [56-73] 67 (04/24 0638) Resp:  [18-23] 18 (04/24 9604) BP: (132-189)/(35-98) 189/49 mmHg (04/24 0638) SpO2:  [92 %-99 %] 96 % (04/24 5409) Weight:  [61.6 kg (135 lb 12.9 oz)] 61.6 kg (135 lb 12.9 oz) (04/23 1711)  General - Well nourished, well developed, not in acute distress.  Ophthalmologic - Fundi not visualized due to  noncooperation.  Cardiovascular - Regular rate and rhythm.  Mental Status -  Awake, alert, orientated to people and self, but not orientated to place, time or age. Language including expression, naming 2/2, repetition, comprehension was assessed and found intact.  Cranial Nerves II - XII - II - Visual field intact OU. III, IV, VI - Extraocular movements intact. V - Facial sensation intact bilaterally. VII - Facial movement intact bilaterally. VIII - Hearing & vestibular intact bilaterally. X - Palate elevates symmetrically. XI - Chin turning & shoulder shrug intact bilaterally. XII - Tongue protrusion intact.  Motor Strength - The patient's strength was 0/5 RUE and 2/5 RLE in the setting of right shoulder and hip pain on moving, 5/5 LUE and 4+/5 LLE due to left hip pain.  Bulk was normal and fasciculations were absent.   Motor Tone - Muscle tone was assessed at the neck and appendages and was decreased at RUE.  Reflexes - The patient's reflexes were 1+ in all extremities and she had no pathological reflexes.  Sensory - symmetrical bilaterally but pain on b/l hip and right shoulder.    Coordination - no ataxia or  dysmetria on the left hand.  Tremor was absent.  Gait and Station - not tested due to safety concerns.   ASSESSMENT/PLAN Ms. [name] Colon is a 82 y.o. female with history of hypertension, diabetes mellitus, thyroid disease, and claudication presenting with a recent fall with head injury. While in ED, developed right hemiplegia. CT showed small left tentorial SDH. She did not receive IV t-PA due to a subdural hematoma.  Stroke: Left MCA territory infarcts, but also left MCA/PCA, right thalamus and left SCA small infarcts, etiology not clear, but may be due to rapid BP lowering in the setting of high grade stenosis of left ICA and multifocal moderate to severe atherosclerosis. Afib still in DDx  Resultant  right hemiplegia  MRI - Acute cortically based infarcts in the  posterior left MCA territory affecting the peri-Rolandic cortex, as well as punctate infarcts in left MCA/PCA, right thalamus and left SCA..  CTA of the neck - 80- 90% focal stenosis LEFT ICA origin, and moderate to severe stenosis bilateral cavernous ICA due to calcific atherosclerosis.  CUS - 40-59% left ICA stenosis, highest end of scale, however, based on plaque formation and ICA/CCA ratio, cannot rule out a higher grade stenosis.    2D Echo - EF 60-65%  Bilateral lower extremity venous duplex - negative for DVT  Repeat CT in am for SDH follow up   Recommend outpt 30 day cardiac event monitoring to rule out afib  LDL - 128  HgbA1c 7.5  VTE prophylaxis - SCDs Diet regular Room service appropriate?: Yes; Fluid consistency:: Thin  No antithrombotic prior to admission. Now on aspirin suppository 300 mg daily (Pt refused to take ASA po as ASA causing her upset stomach). Depends on the SDH follow up in CT, will decide on plavix.  Ongoing aggressive stroke risk factor management  Therapy recommendations: SNF  Disposition: Pending (Lives with son)  Small left temporoparietal SDH - traumatic  Due to fall  Stable on repeat CT  Ok to start ASA for stroke prevention  Will repeat CT in am - Depends on the SDH follow up in CT, will decide on plavix.  Carotid stenosis, left  CTA showed left ICA 80-90%  CUS showed 40-59% left ICA stenosis, highest end of scale, however, based on plaque formation and ICA/CCA ratio, cannot rule out a higher grade stenosis.  Pt has left MCA infarct, likely due to rapid lowering BP  vascular surgery consulted. plan outpatient follow-up in 4-6 weeks.  No CEA this time (functional status 4 with complete hemiplegia, 82 years old, subdural hematoma, which may limit use of antiplatelets/anticoagulants)  Hypertension  On admission BP as high as 269/97, and down to 144/47 in 3 hours  Better controlled but still intermittently high  BP goal 130-160,  avoid hypotension  On coreg and clonidine, increased clonidine to 0.2 tid  Hyperlipidemia  Home meds: No lipid lowering medications prior to admission  LDL 128, goal < 70  Add Lipitor 40 mg daily  Continue statin at discharge  Diabetes  HgbA1c 7.5, goal < 7.0  SSI  CBG monitoring  Close follow up with PCP  Other Stroke Risk Factors  Advanced age  Other Active Problems  Scalp laceration  Hospital day # 3  Marvel PlanJindong Damariz Paganelli, MD PhD Stroke Neurology 03/09/2016 6:18 PM    To contact Stroke Continuity provider, please refer to [email or web address]. After hours, contact General Neurology5

## 2016-03-09 NOTE — Consult Note (Signed)
Physical Medicine and Rehabilitation Consult  Reason for Consult: Right sided weakness and difficulty speaking.  Referring Physician: Dr. Roda Shutters.    HPI: [name] Colon is a 82 y.o. female with history of HTN, DM who fell and struck her head with LOC about 10 seconds and was noted to have MS changes on 03/06/16.  She was evaluated in ED and CT head negative but BP elevated with SBP > 250. She was started on IV labetalol and was found to flaccid right hemiparesis. Repeat CT head showed small acute Left cerebellar and left posterior temporal lobe SDH with moderate right scalp hematoma and moderate to severe small vessel disease. CTA head/neck showed < 50% right ICA, 80-90% L-ICA stenosis and severe centrilobar emphysema with apical bullous changes. 2D echo with EF 60-65%, moderate LVH, moderate pulmonary HTN and grade 2 diastolic dysfunction.  Bilateral hip films negative for fracture. MRI brain showed acute cortical infarcts posterior L-MCA peri-rolandic cortex superimposed small acute lacunar infarcts right thalamus and small Left occipital and posterior hemorraghic contusions.  Dr. Pearlean Brownie consulted and felt that patient with Left MCA and L_PCA infarcts due to rapid BP lowering in setting of severe L-ICA stenosis but A fib still in DDX.  Repeat CCT stable and ASA recommended but patient has declined due to history of GI SE. VVS consulted for input on ICA stenosis. Patient's daughter at bedside, who does the majority of the talking.  Review of Systems  HENT: Positive for hearing loss.   Eyes: Negative for blurred vision and double vision.  Respiratory: Negative for cough and shortness of breath.   Cardiovascular: Negative for chest pain and palpitations.  Gastrointestinal: Negative for heartburn, abdominal pain and diarrhea.       Incontinent of B/B  Musculoskeletal: Positive for myalgias, back pain and joint pain (right hip and right shoulder pain).  Neurological: Positive for speech change  and focal weakness. Negative for headaches.  Psychiatric/Behavioral: The patient has insomnia.   All other systems reviewed and are negative.    Past Medical History  Diagnosis Date  . Hypertension   . Diabetes mellitus   . Thyroid disease   . Claudication [facility])     Past Surgical History  Procedure Laterality Date  . Appendectomy    . Tonsillectomy    . Cataract extraction      Family History  Problem Relation Age of Onset  . Heart failure Mother   . Lung disease Father     Black Lung disease    Social History:  Widowed. 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 Lives with son. Independent and driving PTA. She   reports that she has never smoked. She does not have any smokeless tobacco history on file. She reports that she does not drink alcohol or use illicit drugs.    Allergies  Allergen Reactions  . Doxycycline Anaphylaxis  . Alendronate Sodium Rash  . Losartan Potassium Rash  . Sulfa Antibiotics Rash  . Aspirin Nausea And Vomiting  . Penicillins Rash    Facility-administered medications prior to admission  Medication Dose Route Frequency Provider Last Rate Last Dose  . triamcinolone acetonide (KENALOG) 10 MG/ML injection 10 mg  10 mg Other Once Lenn Sink, DPM       Medications Prior to Admission  Medication Sig Dispense Refill  . carvedilol (COREG) 6.25 MG tablet Take 6.25 mg by mouth 2 (two) times daily.    . Cholecalciferol 2000 UNITS CAPS Take 2,000  Units by mouth daily.    . cloNIDine (CATAPRES) 0.1 MG tablet Take 0.1 mg by mouth 3 (three) times daily.  5  . levothyroxine (SYNTHROID, LEVOTHROID) 75 MCG tablet Take 75 mcg by mouth daily before breakfast.    . loratadine (CLARITIN) 10 MG tablet Take 10 mg by mouth daily.    . primidone (MYSOLINE) 250 MG tablet Take 250 mg by mouth at bedtime.     . Naftifine HCl (NAFTIN) 2 % CREA Apply 1 drop topically 1 day or 1 dose. (Patient not taking: Reported on 03/05/2016) 60 g 2     Home: Home Living Family/patient expects to be discharged to:: Private residence Living Arrangements: Children Available Help at Discharge:  (son) Type of Home: House Home Access: Stairs to enter (2 in rise) Secretary/administrator of Steps: 1 Home Layout: One level Bathroom Shower/Tub: Hydrographic surveyor, Health visitor: Standard Home Equipment: Cane - single point, Banker History: Prior Function Level of Independence: Independent Comments: was driving Functional Status:  Mobility: Bed Mobility Overal bed mobility: Needs Assistance, +2 for physical assistance Bed Mobility: Rolling, Sidelying to Sit Rolling: Mod assist Sidelying to sit: Mod assist, +2 for physical assistance, Max assist General bed mobility comments: max directional cues, unable to use R UE/LE functionally, limited ability to push up with L UE Transfers Overall transfer level: Needs assistance Equipment used:  (2 person lift with gait belt) Transfers: Sit to/from Stand, Stand Pivot Transfers Sit to Stand: Max assist, +2 physical assistance Stand pivot transfers: Max assist, +2 physical assistance General transfer comment: max directional v/c's, unable to use R UE/LE functionally Ambulation/Gait General Gait Details: unable at this time    ADL:    Cognition: Cognition Overall Cognitive Status: Within Functional Limits for tasks assessed Arousal/Alertness: Lethargic Orientation Level: Oriented X4 Attention: Focused, Sustained Focused Attention: Impaired Focused Attention Impairment: Verbal basic, Functional basic Sustained Attention: Impaired Sustained Attention Impairment: Verbal basic, Functional basic Memory:  (will assess) Awareness:  (will assess) Problem Solving: Impaired Cognition Arousal/Alertness: Awake/alert Behavior During Therapy: WFL for tasks assessed/performed Overall Cognitive Status: Within Functional Limits for tasks assessed   Blood pressure  189/49, pulse 67, temperature 98 F (36.7 C), temperature source Oral, resp. rate 18, height  (1.651 m), weight 61.6 kg (135 lb 12.9 oz), SpO2 96 %. Physical Exam  Nursing note and vitals reviewed. Constitutional: She is oriented to person, place, and time. She appears well-developed and well-nourished.  HENT:  Head: Normocephalic and atraumatic.  Eyes: Conjunctivae are normal. Pupils are equal, round, and reactive to light.  Neck: Normal range of motion. Neck supple.  Cardiovascular: Normal rate and regular rhythm.   No murmur heard. Respiratory: Effort normal and breath sounds normal. No respiratory distress. She has no wheezes.  GI: Soft. Bowel sounds are normal. She exhibits no distension. There is no tenderness.  Musculoskeletal: She exhibits tenderness. She exhibits no edema.  Neurological: She is alert and oriented to person, place, and time.  DTRs symmetric Motor: Left upper extremity: 4/5 proximal to distal Left lower extremity: Hip flexion, knee extension 4 -/5, ankle dorsi/plantarflexion 5/5  Skin: Skin is warm and dry.  Psychiatric: She has a normal mood and affect. Her behavior is normal.    Results for orders placed or performed during the hospital encounter of 03/05/16 (from the past 24 hour(s))  Glucose, capillary     Status: Abnormal   Collection Time: 03/08/16 12:59 PM  Result Value Ref Range   Glucose-Capillary 132 (H)  65 - 99 mg/dL  Glucose, capillary     Status: Abnormal   Collection Time: 03/08/16  7:08 PM  Result Value Ref Range   Glucose-Capillary 162 (H) 65 - 99 mg/dL  Glucose, capillary     Status: Abnormal   Collection Time: 03/08/16 10:56 PM  Result Value Ref Range   Glucose-Capillary 124 (H) 65 - 99 mg/dL  Glucose, capillary     Status: Abnormal   Collection Time: 03/09/16 12:17 AM  Result Value Ref Range   Glucose-Capillary 130 (H) 65 - 99 mg/dL  CBC     Status: None   Collection Time: 03/09/16  5:32 AM  Result Value Ref Range   WBC 5.8 4.0  - 10.5 K/uL   RBC 4.54 3.87 - 5.11 MIL/uL   Hemoglobin 13.5 12.0 - 15.0 g/dL   HCT 78.241.1 95.636.0 - 21.346.0 %   MCV 90.5 78.0 - 100.0 fL   MCH 29.7 26.0 - 34.0 pg   MCHC 32.8 30.0 - 36.0 g/dL   RDW 08.613.6 57.811.5 - 46.915.5 %   Platelets 181 150 - 400 K/uL  Basic metabolic panel     Status: Abnormal   Collection Time: 03/09/16  5:32 AM  Result Value Ref Range   Sodium 136 135 - 145 mmol/L   Potassium 4.2 3.5 - 5.1 mmol/L   Chloride 103 101 - 111 mmol/L   CO2 20 (L) 22 - 32 mmol/L   Glucose, Bld 134 (H) 65 - 99 mg/dL   BUN 19 6 - 20 mg/dL   Creatinine, Ser 6.290.78 0.44 - 1.00 mg/dL   Calcium 8.6 (L) 8.9 - 10.3 mg/dL   GFR calc non Af Amer >60 >60 mL/min   GFR calc Af Amer >60 >60 mL/min   Anion gap 13 5 - 15  Glucose, capillary     Status: Abnormal   Collection Time: 03/09/16  8:19 AM  Result Value Ref Range   Glucose-Capillary 140 (H) 65 - 99 mg/dL   Dg Shoulder Left Port  03/07/2016  CLINICAL DATA:  Pt had a fall from a stroke on Thursday. Pt having continued bilateral hip pain and bilateral shoulder pain, daughter reports that pt believes it might be from being transferred and moved around so much. EXAM: LEFT SHOULDER - 1 VIEW COMPARISON:  None. FINDINGS: Glenohumeral joint is intact. No evidence of scapular fracture or humeral fracture. The acromioclavicular joint is intact. IMPRESSION: No fracture or dislocation. Electronically Signed   By: Genevive BiStewart  Edmunds M.D.   On: 03/07/2016 13:26   Dg Shoulder Right Port  03/07/2016  CLINICAL DATA:  Pt had a fall from a stroke on Thursday. Pt having continued bilateral hip pain and bilateral shoulder pain, daughter reports that pt believes it might be from being transferred and moved around so much. EXAM: PORTABLE RIGHT SHOULDER - 2+ VIEW COMPARISON:  09/27/2013 FINDINGS: Glenohumeral joint is intact. No evidence of scapular fracture or humeral fracture. The acromioclavicular joint is intact. IMPRESSION: No fracture or dislocation. Electronically Signed   By:  Genevive BiStewart  Edmunds M.D.   On: 03/07/2016 13:30   Dg Hip Port Unilat With Pelvis 1v Left  03/07/2016  CLINICAL DATA:  Fall from a stroke on Thursday EXAM: DG HIP (WITH OR WITHOUT PELVIS) 1V PORT LEFT COMPARISON:  None. FINDINGS: Single view of the LEFT hip demonstrates no fracture dislocation. IMPRESSION: No fracture dislocation Electronically Signed   By: Genevive BiStewart  Edmunds M.D.   On: 03/07/2016 13:17   Dg Hip Port Unilat With Pelvis 1v  Right  03/07/2016  CLINICAL DATA:  Pt had a fall from a stroke on Thursday. Pt having continued bilateral hip pain and bilateral shoulder pain, daughter reports that pt believes it might be from being transferred and moved around so much. EXAM: DG HIP (WITH OR WITHOUT PELVIS) 1V PORT RIGHT COMPARISON:  None. FINDINGS: Hips are located. No pelvic fracture sacral fracture. Dedicated view of the RIGHT hip demonstrates no femoral neck fracture. IMPRESSION: No pelvic fracture or hip fracture. Electronically Signed   By: Genevive Bi M.D.   On: 03/07/2016 13:15    Assessment/Plan: Diagnosis: TBI/SDH, acute cortical infarcts posterior L-MCA, lacunar infarcts right thalamus, and Left occipital and posterior hemorraghic contusions Labs and images independently reviewed.  Records reviewed and summated above. Stroke: Continue secondary stroke prophylaxis and Risk Factor Modification listed below:   Antiplatelet therapy:   Blood Pressure Management:  Continue current medication with prn's with permisive HTN per primary team Statin Agent:   Diabetes management:   Right sided hemiparesis: fit for orthotics to prevent contractures (resting hand splint for day, wrist cock up splint at night, PRAFO, etc) Motor recovery: Fluoxetine  Speech to evaluate for Post traumatic amnesia and interval GOAT scores to assess progress.  Address behavioral concerns include providing structured environments and daily routines.  Cognitive therapy to direct modular abilities in order to maintain  goals including problem solving, self regulation/monitoring, self management, attention, and memory.  Fall precautions; pt at risk for second impact syndrome  Prevention of secondary injury: monitor for hypotension, hypoxia, seizures or signs of increased ICP  Prophylactic AED:   Avoid medications that could impair cognitive abilities, such as anticholinergics, antihistaminic, benzodiazapines, narcotics, etc when possible  1. Does the need for close, 24 hr/day medical supervision in concert with the patient's rehab needs make it unreasonable for this patient to be served in a less intensive setting? Yes  2. Co-Morbidities requiring supervision/potential complications: HTN (monitor and provide prns in accordance with increased physical exertion and pain), DM  (Monitor in accordance with exercise and adjust meds as necessary), moderate pulmonary HTN (Monitor in accordance with increased physical activity and avoid UE resistance excercises), diastolic dysfunction (monitor weights), hypothyroidism (cont meds, ensure mood and energy do not limit functional progress) traumatic brain injury 3. Due to bladder management, bowel management, safety, disease management, medication administration and patient education, does the patient require 24 hr/day rehab nursing? Yes 4. Does the patient require coordinated care of a physician, rehab nurse, PT (1-2 hrs/day, 5 days/week), OT (1-2 hrs/day, 5 days/week) and SLP (1-2 hrs/day, 5 days/week) to address physical and functional deficits in the context of the above medical diagnosis(es)? Yes Addressing deficits in the following areas: balance, endurance, locomotion, strength, transferring, bowel/bladder control, dressing, grooming, toileting, cognition and psychosocial support 5. Can the patient actively participate in an intensive therapy program of at least 3 hrs of therapy per day at least 5 days per week? Yes 6. The potential for patient to make measurable gains while  on inpatient rehab is excellent 7. Anticipated functional outcomes upon discharge from inpatient rehab are min assist and mod assist  with PT, min assist and mod assist with OT, supervision and min assist with SLP. 8. Estimated rehab length of stay to reach the above functional goals is: 20-24 days. 9. Does the patient have adequate social supports and living environment to accommodate these discharge functional goals? Potentially 10. Anticipated D/C setting: Home 11. Anticipated post D/C treatments: HH therapy and Home excercise program 12. Overall Rehab/Functional  Prognosis: good and fair  RECOMMENDATIONS: This patient's condition is appropriate for continued rehabilitative care in the following setting: Patient's discharged disposition unclear at this time. Patient's daughter would like to think patient back to [place] and is in the process of acquiring information.  If this is not possible, it does not appear that the patient has 24/7 support at discharge in [place]. If this is the case, would recommend SNF after medical workup and plans from vascular. Patient has agreed to participate in recommended program. Potentially Note that insurance prior authorization may be required for reimbursement for recommended care.  Comment: Rehab Admissions Coordinator to follow up.  Maryla Morrow, MD 03/09/2016

## 2016-03-09 NOTE — Progress Notes (Signed)
I will follow up with family to assist with planning most appropriate rehab venue. [phone]

## 2016-03-09 NOTE — Progress Notes (Addendum)
VASCULAR LAB PRELIMINARY  PRELIMINARY  PRELIMINARY  PRELIMINARY  Carotid duplex completed.    Preliminary report:  1-39% right ICA plaquing.  40-59% left ICA stenosis, highest end of scale, however, based on plaque formation and ICA/CCA ratio, cannot rule out a higher grade stenosis.  Vertebral artery flow is antegrade.   Namiah Dunnavant, RVT 03/09/2016, 2:42 PM

## 2016-03-09 NOTE — NC FL2 (Signed)
[place] MEDICAID FL2 LEVEL OF CARE SCREENING TOOL     IDENTIFICATION  Patient Name: [name] Colon Birthdate: 01/28/1920 Sex: female Admission Date (Current Location): 03/05/2016  [facility] [place]   Facility and Address:  [facility], [address], [place], [place] [address]      Provider Number: [id number]  Attending Physician Name and Address:  Marvel PlanJindong Xu, MD  Relative Name and Phone Number:       Current Level of Care: Hospital Recommended Level of Care: Skilled Nursing Facility Prior Approval Number:    Date Approved/Denied:   PASRR Number: [id number]  Discharge Plan: SNF    Current Diagnoses: Patient Active Problem List   Diagnosis Date Noted  . Hypertensive crisis 03/06/2016  . SDH (subdural hematoma) (HCC) 03/06/2016  . Cerebral thrombosis with cerebral infarction 03/06/2016  . Bowel obstruction (HCC) 09/28/2013  . SIRS (systemic inflammatory response syndrome) (HCC) 09/25/2013  . Thrombocytopenia (HCC) 09/25/2013  . Hypothyroidism 09/25/2013  . Leukopenia 09/25/2013  . Poor venous access 09/25/2013  . HTN (hypertension) 09/25/2013  . ?? Wide-complex tachycardia 09/25/2013  . Dehydration with hyponatremia 09/25/2013  . Claudication (HCC) 08/21/2013  . Type 2 diabetes mellitus (HCC) 08/21/2013    Orientation RESPIRATION BLADDER Height & Weight     Self, Time, Situation, Place  Normal Incontinent Weight: 135 lb 12.9 oz (61.6 kg) Height:  5\' 5"  (165.1 cm)  BEHAVIORAL SYMPTOMS/MOOD NEUROLOGICAL BOWEL NUTRITION STATUS      Continent Diet (regular)  AMBULATORY STATUS COMMUNICATION OF NEEDS Skin   Extensive Assist Verbally Normal                       Personal Care Assistance Level of Assistance  Bathing, Dressing Bathing Assistance: Maximum assistance   Dressing Assistance: Maximum assistance     Functional Limitations Info             SPECIAL CARE FACTORS FREQUENCY  PT (By licensed  PT), OT (By licensed OT)     PT Frequency: 5/wk OT Frequency: 5/wk            Contractures      Additional Factors Info  Code Status, Allergies, Insulin Sliding Scale Code Status Info: FULL Allergies Info: Doxycycline, Alendronate Sodium, Losartan Potassium, Sulfa Antibiotics, Aspirin, Penicillins   Insulin Sliding Scale Info: 3/day       Current Medications (03/09/2016):  This is the current hospital active medication list Current Facility-Administered Medications  Medication Dose Route Frequency Provider Last Rate Last Dose  . acetaminophen (TYLENOL) tablet 650 mg  650 mg Oral Q4H PRN Heron SabinsJose Vega, MD      . aspirin suppository 300 mg  300 mg Rectal Daily Marvel PlanJindong Xu, MD   300 mg at 03/09/16 0950  . atorvastatin (LIPITOR) tablet 40 mg  40 mg Oral q1800 David L Rinehuls, PA-C   40 mg at 03/08/16 1817  . carvedilol (COREG) tablet 6.25 mg  6.25 mg Oral BID WC Heron SabinsJose Vega, MD   6.25 mg at 03/09/16 0951  . cloNIDine (CATAPRES) tablet 0.2 mg  0.2 mg Oral TID Marvel PlanJindong Xu, MD   0.2 mg at 03/09/16 0951  . heparin injection 5,000 Units  5,000 Units Subcutaneous Q12H Marvel PlanJindong Xu, MD   5,000 Units at 03/09/16 [phone] [id number]  . hydrALAZINE (APRESOLINE) injection 10 mg  10 mg Intravenous Q4H PRN Heron SabinsJose Vega, MD   10 mg at 03/08/16 0520  . insulin aspart (novoLOG) injection 0-15 Units  0-15  Units Subcutaneous TID WC Marvel Plan, MD   2 Units at 03/09/16 0950  . levothyroxine (SYNTHROID, LEVOTHROID) tablet 75 mcg  75 mcg Oral QAC breakfast Heron Sabins, MD   75 mcg at 03/09/16 0951  . ondansetron (ZOFRAN) injection 4 mg  4 mg Intravenous Q6H PRN Briscoe Deutscher, MD   4 mg at 03/08/16 0607  . pantoprazole (PROTONIX) EC tablet 40 mg  40 mg Oral QHS Bertram Millard, RPH   40 mg at 03/08/16 2300  . primidone (MYSOLINE) tablet 250 mg  250 mg Oral QHS Heron Sabins, MD   250 mg at 03/09/16 0216  . senna-docusate (Senokot-S) tablet 1 tablet  1 tablet Oral BID Heron Sabins, MD   1 tablet at 03/09/16 1914     Discharge  Medications: Please see discharge summary for a list of discharge medications.  Relevant Imaging Results:  Relevant Lab Results:   Additional Information SS#: [id number]  Izora Ribas, [place]

## 2016-03-09 NOTE — Clinical Social Work Note (Signed)
Clinical Social Work Assessment  Patient Details  Name: [name] Colon MRN: [id number] Date of Birth: 12/26/1919  Date of referral:  03/09/16               Reason for consult:  Facility Placement                Permission sought to share information with:  Family Supports, Oceanographeracility Contact Representative Permission granted to share information::  Yes, Verbal Permission Granted  Name::     Education officer, environmentalLauren  [facility]  Relationship::  dtr  Contact Information:     Housing/Transportation Living arrangements for the past 2 months:  Single Family Home Source of Information:  Patient Patient Interpreter Needed:  None Criminal Activity/Legal Involvement Pertinent to Current Situation/Hospitalization:  No - Comment as needed Significant Relationships:  Adult Children Lives with:  Adult Children Do you feel safe going back to the place where you live?    Need for family participation in patient care:  Yes (Comment) (help with decision making)  Care giving concerns:  Pt lives at home with son but unsure if this is safe with current level of impairment   Office managerocial Worker assessment / plan:  CSW spoke with pt and pt dtr, [name] Colon, at bedside concerning PT recommendation for CIR vs SNF.  Pt was alert and oriented during assessment but slow in responding though all responses were appropriate.    CSW explained differences between CIR and SNF and discussed SNF as alternate plan to CIR if pt is not accepted to their program.  Pt expresses understanding and that she has been to rehab in the past.  Employment status:  Retired Health and safety inspectornsurance information:  Medicare PT Recommendations:  Inpatient Rehab Consult Information / Referral to community resources:  Skilled Nursing Facility  Patient/Family's Response to care:  Pt is agreeable to CIR or SNF but currently thinks SNF is likely to be her more realistic option after CSW described rigorous therapy schedule in CIR.  Pt and dtr are unsure of which  facility and would like to tour facilities prior to pt transfer.  Patient/Family's Understanding of and Emotional Response to Diagnosis, Current Treatment, and Prognosis:  Pt and dtr very realistic about pt needs and prognosis at this time- hopeful that pt can return home soon following therapy.  Emotional Assessment Appearance:  Appears younger than stated age Attitude/Demeanor/Rapport:  Sedated Affect (typically observed):  Appropriate Orientation:  Oriented to Situation, Oriented to  Time, Oriented to Place, Oriented to Self Alcohol / Substance use:  Not Applicable Psych involvement (Current and /or in the community):  No (Comment)  Discharge Needs  Concerns to be addressed:  Care Coordination, Discharge Planning Concerns Readmission within the last 30 days:  No Current discharge risk:  Physical Impairment Barriers to Discharge:  Continued Medical Work up   Lehman BrothersHoloman, PhiloJenna M, LCSW 03/09/2016, 1:20 PM

## 2016-03-09 NOTE — Progress Notes (Signed)
Pt's BP was 189/49. Coreg will be administered. MD notified. Will continue to monitor.

## 2016-03-09 NOTE — Care Management Important Message (Signed)
Important Message  Patient Details  Name: [name] Colon MRN: [id number] Date of Birth: 11/25/1919   Medicare Important Message Given:  Yes    Kyla BalzarineShealy, Daleah Coulson Abena 03/09/2016, 12:25 PM

## 2016-03-09 NOTE — Evaluation (Signed)
Physical Therapy Evaluation Patient Details Name: Ivar Drapelizabeth M Pyles MRN: [id number] DOB: 03/08/1920 Today's Date: 03/09/2016   History of Present Illness  Ivar Drapelizabeth M Duling is a 82 y.o. female, s/p left brain stroke 3 days ago. She was noted on CT to have subdural hematoma and scalp hematoma from a fall as well. 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 She is able to talk and according to her daughter was functioning independently prior to this event. Other medical problems include hypertension and diabetes. Her blood pressure was poorly controlled on admission but is better now.  Clinical Impression  Pt admitted with above. Pt indep PTA, using cane occasionally and con't to drive and run errands. Pt now presenting with R hemiplegia, maxA for all transfers, and inability to amb or use R UE/LE functionally. Pt is an excellent candidate for CIR upon d/c for maximal functional recovery for safe transition home with son.    Follow Up Recommendations CIR;Supervision/Assistance - 24 hour    Equipment Recommendations   (TBD by next venue)    Recommendations for Other Services Rehab consult     Precautions / Restrictions Precautions Precautions: Fall Restrictions Weight Bearing Restrictions: No      Mobility  Bed Mobility Overal bed mobility: Needs Assistance;+2 for physical assistance Bed Mobility: Rolling;Sidelying to Sit Rolling: Mod assist Sidelying to sit: Mod assist;+2 for physical assistance;Max assist       General bed mobility comments: max directional cues, unable to use R UE/LE functionally, limited ability to push up with L UE  Transfers Overall transfer level: Needs assistance Equipment used:  (2 person lift with gait belt) Transfers: Sit to/from Stand;Stand Pivot Transfers Sit to Stand: Max assist;+2 physical assistance Stand pivot transfers: Max assist;+2 physical assistance       General transfer comment: max directional v/c's, unable to use R UE/LE  functionally  Ambulation/Gait             General Gait Details: unable at this time  Stairs            Wheelchair Mobility    Modified Rankin (Stroke Patients Only) Modified Rankin (Stroke Patients Only) Pre-Morbid Rankin Score: No significant disability Modified Rankin: Severe disability     Balance Overall balance assessment: Needs assistance Sitting-balance support: Single extremity supported;Feet supported Sitting balance-Leahy Scale: Poor Sitting balance - Comments: unable to use R UE to assist, lean to the L, requiring modA to maintain midline and upright posture Postural control: Left lateral lean   Standing balance-Leahy Scale: Zero Standing balance comment: uanble to maintain standing                             Pertinent Vitals/Pain Pain Assessment: Faces Faces Pain Scale: Hurts little more Pain Location: R hip with mvmt, and R UE with mvmt Pain Descriptors / Indicators: Grimacing Pain Intervention(s): Monitored during session    Home Living Family/patient expects to be discharged to:: Private residence Living Arrangements: Children Available Help at Discharge:  (son) Type of Home: House Home Access: Stairs to enter (2 in rise)   Secretary/administratorntrance Stairs-Number of Steps: 1 Home Layout: One level Home Equipment: Cane - single point;Shower seat      Prior Function Level of Independence: Independent         Comments: was driving     Hand Dominance        Extremity/Trunk Assessment   Upper Extremity Assessment: RUE deficits/detail;LUE deficits/detail RUE Deficits / Details: grossly flaccid,  impaired sensation   RUE Sensation: decreased light touch;decreased proprioception LUE Deficits / Details: generalized weakness   Lower Extremity Assessment: RLE deficits/detail;LLE deficits/detail RLE Deficits / Details: grossly flaccid, impaired senstation LLE Deficits / Details: generalized weakness  Cervical / Trunk Assessment: Normal   Communication   Communication: No difficulties  Cognition Arousal/Alertness: Awake/alert Behavior During Therapy: WFL for tasks assessed/performed Overall Cognitive Status: Within Functional Limits for tasks assessed                      General Comments General comments (skin integrity, edema, etc.): pt found soiled in urine. pt unaware. pt dependent for hygiene    Exercises        Assessment/Plan    PT Assessment Patient needs continued PT services  PT Diagnosis Difficulty walking;Generalized weakness;Hemiplegia dominant side   PT Problem List Decreased strength;Decreased activity tolerance;Decreased balance;Decreased mobility;Decreased coordination;Decreased knowledge of use of DME;Decreased safety awareness;Decreased knowledge of precautions;Impaired sensation;Pain  PT Treatment Interventions DME instruction;Gait training;Stair training;Functional mobility training;Therapeutic activities;Therapeutic exercise;Balance training;Neuromuscular re-education;Cognitive remediation   PT Goals (Current goals can be found in the Care Plan section) Acute Rehab PT Goals Patient Stated Goal: none stated PT Goal Formulation: With patient/family Time For Goal Achievement: 03/23/16 Potential to Achieve Goals: Good    Frequency Min 4X/week   Barriers to discharge        Co-evaluation               End of Session Equipment Utilized During Treatment: Gait belt Activity Tolerance: Patient tolerated treatment well Patient left: in chair;with call bell/phone within reach;with chair alarm set;with nursing/sitter in room Nurse Communication: Mobility status         Time: 1610-9604 PT Time Calculation (min) (ACUTE ONLY): 28 min   Charges:   PT Evaluation $PT Eval Moderate Complexity: 1 Procedure PT Treatments $Therapeutic Activity: 8-22 mins   PT G CodesMarcene Brawn 03/09/2016, 10:46 AM   Lewis Shock, PT, DPT Pager #: [phone] Office #:  [phone]

## 2016-03-09 NOTE — Evaluation (Addendum)
Occupational Therapy Evaluation Patient Details Name: [name] Colon MRN: [id number] DOB: 09/27/1920 Today's Date: 03/09/2016    History of Present Illness [name] Colon is a 82 y.o. female admitted s/p fall and code stroke called while pt in ED.  MRI revealed infarcts in posterior Lt MCA territory and superimposed small acute lacunar infarct in the Rt thalamas. Noted on CT to have subdural hematoma and scalp hematoma from a fall as well. PMH includes thyroid disease, claudication, hypertension and diabetes. Her blood pressure was poorly controlled on admission.   Clinical Impression   Pt s/p above. Pt independent with ADLs, PTA. Feel pt will benefit from acute OT to increase independence prior to d/c. Recommending CIR for rehab consult.     Follow Up Recommendations  CIR    Equipment Recommendations  Other (comment) (defer to next venue)    Recommendations for Other Services       Precautions / Restrictions Precautions Precautions: Fall Restrictions Weight Bearing Restrictions: No      Mobility Bed Mobility Overal bed mobility: Needs Assistance;+2 for physical assistance Bed Mobility: Supine to Sit;Sit to Supine     Supine to sit: Max assist Sit to supine: Max assist   General bed mobility comments: +2 assist and trendelenburg position used to scoot pt HOB. Cues given for supine to sit.  Transfers                 General transfer comment: not assessed    Balance        Unsteady sitting EOB-OT assisted in getting pt to sit upright.                                     ADL Overall ADL's : Needs assistance/impaired Eating/Feeding: Set up;Supervision/ safety;Bed level               Upper Body Dressing : Maximal assistance;Bed level   Lower Body Dressing: Total assistance;Bed level                 General ADL Comments: sat EOB in session-became dizzy. Discussed positioning of Rt UE and positioning it on pillows at end of  session.      Vision     Perception     Praxis      Pertinent Vitals/Pain Pain Assessment: 0-10 Pain Score: 8  Pain Location: Rt LE and Rt UE Pain Descriptors / Indicators: Grimacing Pain Intervention(s): Monitored during session;Repositioned     Hand Dominance Right   Extremity/Trunk Assessment Upper Extremity Assessment Upper Extremity Assessment: RUE deficits/detail (reports shoulder surgery on bilateral shoulders) RUE Deficits / Details: flaccid; edema in digits RUE Sensation: decreased light touch RUE Coordination: decreased fine motor;decreased gross motor   Lower Extremity Assessment Lower Extremity Assessment: Defer to PT evaluation       Communication Communication Communication: No difficulties   Cognition Arousal/Alertness: Awake/alert Behavior During Therapy: WFL for tasks assessed/performed Overall Cognitive Status: Within Functional Limits for tasks assessed                     General Comments       Exercises Exercises: Other exercises Other Exercises Other Exercises: educated on PROM of right digits and demonstrated on pt   Shoulder Instructions      Home Living Family/patient expects to be discharged to:: Unsure Living Arrangements: Children Available Help at Discharge: Family (son) Type of Home: House  Home Access: Stairs to enter (2 in rise) Entrance Stairs-Number of Steps: 1   Home Layout: One level     Bathroom Shower/Tub: Tub/shower unit;Walk-in shower   Bathroom Toilet: Standard     Home Equipment: Cane - single point;Shower seat          Prior Functioning/Environment Level of Independence: Independent        Comments: was driving    OT Diagnosis: Hemiplegia dominant side   OT Problem List: Decreased strength;Decreased range of motion;Increased edema;Pain;Impaired UE functional use;Decreased knowledge of precautions;Impaired balance (sitting and/or standing);Decreased activity tolerance;Decreased  coordination;Decreased knowledge of use of DME or AE;Impaired sensation   OT Treatment/Interventions: DME and/or AE instruction;Patient/family education;Balance training;Neuromuscular education;Therapeutic activities;Self-care/ADL training;Therapeutic exercise    OT Goals(Current goals can be found in the care plan section) Acute Rehab OT Goals Patient Stated Goal: none stated OT Goal Formulation: With patient Time For Goal Achievement: 03/23/16 Potential to Achieve Goals: Good ADL Goals Pt Will Perform Grooming: sitting;with min guard assist (sitting EOB) Pt Will Perform Upper Body Bathing: with min guard assist;sitting (sitting EOB) Pt Will Perform Upper Body Dressing: sitting;with min assist (sitting EOB) Pt Will Transfer to Toilet: with mod assist;stand pivot transfer;bedside commode Additional ADL Goal #1: Pt/caregiver will be independent in positioning Rt UE and performing PROM to digits.  OT Frequency: Min 2X/week   Barriers to D/C:            Co-evaluation              End of Session Nurse Communication: Other (comment) (pt needs cleaning up and bed needs changing)  Activity Tolerance: Other (comment) (dizziness) Patient left: in bed;with call bell/phone within reach;with bed alarm set;with family/visitor present   Time: [id number] OT Time Calculation (min): 19 min Charges:  OT General Charges $OT Visit: 1 Procedure OT Evaluation $OT Eval Moderate Complexity: 1 Procedure G-CodesEarlie Raveling:    Ramadan Couey L OTR/L [phone] 03/09/2016, 4:55 PM

## 2016-03-09 NOTE — Telephone Encounter (Signed)
Pt had carotid u/c at [facility] 4/24. sched md f/u 6/8 at 10:45. Lm on hm# to inform pt of appt.

## 2016-03-09 NOTE — Progress Notes (Signed)
Rehab Admissions Coordinator Note:  Patient was screened by Clois DupesBoyette, Marcelline Temkin Godwin for appropriateness for an Inpatient Acute Rehab Consult per PT recommendation.   At this time, we are recommending Inpatient Rehab consult.  Clois DupesBoyette, Camela Wich Godwin 03/09/2016, 11:15 AM  I can be reached at [phone].

## 2016-03-09 NOTE — Telephone Encounter (Signed)
-----   Message from Sharee PimpleMarilyn K McChesney, RN sent at 03/09/2016  9:59 AM EDT ----- Regarding: schedule   ----- Message -----    From: Sherren Kernsharles E Fields, MD    Sent: 03/08/2016   1:48 PM      To: [facility] Charge Pool  Level 4 consult for symptomatic carotid Dr Roda ShuttersXu She needs follow up appt in 6 weeks with me and bilateral carotid duplex at that time  Fabienne Brunsharles Fields

## 2016-03-10 ENCOUNTER — Inpatient Hospital Stay (HOSPITAL_COMMUNITY): Payer: Medicare Other

## 2016-03-10 ENCOUNTER — Inpatient Hospital Stay (HOSPITAL_COMMUNITY)
Admission: RE | Admit: 2016-03-10 | Discharge: 2016-03-26 | DRG: 057 | Disposition: A | Discharge status: Skilled Nursing Facility | Payer: Medicare Other | Source: Intra-hospital | Attending: Physical Medicine & Rehabilitation | Admitting: Physical Medicine & Rehabilitation

## 2016-03-10 DIAGNOSIS — S065X1S Traumatic subdural hemorrhage with loss of consciousness of 30 minutes or less, sequela: Secondary | ICD-10-CM

## 2016-03-10 DIAGNOSIS — I6330 Cerebral infarction due to thrombosis of unspecified cerebral artery: Secondary | ICD-10-CM

## 2016-03-10 DIAGNOSIS — N390 Urinary tract infection, site not specified: Secondary | ICD-10-CM | POA: Diagnosis not present

## 2016-03-10 DIAGNOSIS — Z87891 Personal history of nicotine dependence: Secondary | ICD-10-CM

## 2016-03-10 DIAGNOSIS — R4189 Other symptoms and signs involving cognitive functions and awareness: Secondary | ICD-10-CM | POA: Diagnosis present

## 2016-03-10 DIAGNOSIS — K5900 Constipation, unspecified: Secondary | ICD-10-CM | POA: Diagnosis not present

## 2016-03-10 DIAGNOSIS — S069X9A Unspecified intracranial injury with loss of consciousness of unspecified duration, initial encounter: Secondary | ICD-10-CM | POA: Diagnosis present

## 2016-03-10 DIAGNOSIS — I5032 Chronic diastolic (congestive) heart failure: Secondary | ICD-10-CM | POA: Diagnosis present

## 2016-03-10 DIAGNOSIS — I110 Hypertensive heart disease with heart failure: Secondary | ICD-10-CM | POA: Diagnosis present

## 2016-03-10 DIAGNOSIS — S069X1S Unspecified intracranial injury with loss of consciousness of 30 minutes or less, sequela: Secondary | ICD-10-CM

## 2016-03-10 DIAGNOSIS — Z0000 Encounter for general adult medical examination without abnormal findings: Secondary | ICD-10-CM

## 2016-03-10 DIAGNOSIS — M7581 Other shoulder lesions, right shoulder: Secondary | ICD-10-CM | POA: Diagnosis present

## 2016-03-10 DIAGNOSIS — B9620 Unspecified Escherichia coli [E. coli] as the cause of diseases classified elsewhere: Secondary | ICD-10-CM | POA: Diagnosis not present

## 2016-03-10 DIAGNOSIS — F068 Other specified mental disorders due to known physiological condition: Secondary | ICD-10-CM | POA: Diagnosis not present

## 2016-03-10 DIAGNOSIS — E119 Type 2 diabetes mellitus without complications: Secondary | ICD-10-CM | POA: Diagnosis present

## 2016-03-10 DIAGNOSIS — W19XXXS Unspecified fall, sequela: Secondary | ICD-10-CM | POA: Diagnosis present

## 2016-03-10 DIAGNOSIS — I10 Essential (primary) hypertension: Secondary | ICD-10-CM | POA: Diagnosis present

## 2016-03-10 DIAGNOSIS — G8101 Flaccid hemiplegia affecting right dominant side: Secondary | ICD-10-CM | POA: Diagnosis present

## 2016-03-10 DIAGNOSIS — S069X2S Unspecified intracranial injury with loss of consciousness of 31 minutes to 59 minutes, sequela: Secondary | ICD-10-CM | POA: Diagnosis not present

## 2016-03-10 DIAGNOSIS — S069XAA Unspecified intracranial injury with loss of consciousness status unknown, initial encounter: Secondary | ICD-10-CM | POA: Diagnosis present

## 2016-03-10 DIAGNOSIS — I272 Other secondary pulmonary hypertension: Secondary | ICD-10-CM | POA: Diagnosis present

## 2016-03-10 DIAGNOSIS — G4700 Insomnia, unspecified: Secondary | ICD-10-CM | POA: Diagnosis present

## 2016-03-10 DIAGNOSIS — R5383 Other fatigue: Secondary | ICD-10-CM | POA: Insufficient documentation

## 2016-03-10 DIAGNOSIS — S069X0S Unspecified intracranial injury without loss of consciousness, sequela: Secondary | ICD-10-CM

## 2016-03-10 DIAGNOSIS — I63132 Cerebral infarction due to embolism of left carotid artery: Secondary | ICD-10-CM | POA: Diagnosis present

## 2016-03-10 LAB — BASIC METABOLIC PANEL
ANION GAP: 12 (ref 5–15)
BUN: 15 mg/dL (ref 6–20)
CO2: 19 mmol/L — AB (ref 22–32)
CREATININE: 0.72 mg/dL (ref 0.44–1.00)
Calcium: 8.7 mg/dL — ABNORMAL LOW (ref 8.9–10.3)
Chloride: 104 mmol/L (ref 101–111)
GFR calc Af Amer: >60 mL/min (ref >60)
GLUCOSE: 149 mg/dL — AB (ref 65–99)
Potassium: 3.7 mmol/L (ref 3.5–5.1)
Sodium: 135 mmol/L (ref 135–145)

## 2016-03-10 LAB — CBC
HCT: 41.4 % (ref 36.0–46.0)
Hemoglobin: 13.4 g/dL (ref 12.0–15.0)
MCH: 29.1 pg (ref 26.0–34.0)
MCHC: 32.4 g/dL (ref 30.0–36.0)
MCV: 90 fL (ref 78.0–100.0)
PLATELETS: 175 10*3/uL (ref 150–400)
RBC: 4.6 MIL/uL (ref 3.87–5.11)
RDW: 13.4 % (ref 11.5–15.5)
WBC: 4.5 10*3/uL (ref 4.0–10.5)

## 2016-03-10 LAB — URINALYSIS, ROUTINE W REFLEX MICROSCOPIC
Bilirubin Urine: NEGATIVE
Glucose, UA: 100 mg/dL — AB
Hgb urine dipstick: NEGATIVE
KETONES UR: NEGATIVE mg/dL
LEUKOCYTES UA: NEGATIVE
NITRITE: NEGATIVE
PROTEIN: NEGATIVE mg/dL
Specific Gravity, Urine: 1.013 (ref 1.005–1.030)
pH: 6.5 (ref 5.0–8.0)

## 2016-03-10 LAB — GLUCOSE, CAPILLARY
GLUCOSE-CAPILLARY: 183 mg/dL — AB (ref 65–99)
GLUCOSE-CAPILLARY: 140 mg/dL — AB (ref 65–99)
Glucose-Capillary: 131 mg/dL — ABNORMAL HIGH (ref 65–99)
Glucose-Capillary: 133 mg/dL — ABNORMAL HIGH (ref 65–99)
Glucose-Capillary: 162 mg/dL — ABNORMAL HIGH (ref 65–99)
Glucose-Capillary: 171 mg/dL — ABNORMAL HIGH (ref 65–99)

## 2016-03-10 MED ORDER — PRIMIDONE 250 MG PO TABS
250.0000 mg | ORAL_TABLET | Freq: Every day | ORAL | Status: DC
  Administered 2016-03-10 – 2016-03-25 (×16): 250 mg via ORAL
  Filled 2016-03-10 (×17): qty 1

## 2016-03-10 MED ORDER — ALUM & MAG HYDROXIDE-SIMETH 200-200-20 MG/5ML PO SUSP
30.0000 mL | ORAL | Status: DC | PRN
  Administered 2016-03-26: 30 mL via ORAL
  Filled 2016-03-10: qty 30

## 2016-03-10 MED ORDER — CLONIDINE HCL 0.2 MG PO TABS
0.2000 mg | ORAL_TABLET | Freq: Three times a day (TID) | ORAL | Status: DC
  Administered 2016-03-10 – 2016-03-22 (×35): 0.2 mg via ORAL
  Filled 2016-03-10 (×36): qty 1

## 2016-03-10 MED ORDER — FLEET ENEMA 7-19 GM/118ML RE ENEM
1.0000 | ENEMA | Freq: Once | RECTAL | Status: AC | PRN
  Administered 2016-03-26: 1 via RECTAL
  Filled 2016-03-10: qty 1

## 2016-03-10 MED ORDER — HYDRALAZINE HCL 20 MG/ML IJ SOLN
10.0000 mg | Freq: Once | INTRAMUSCULAR | Status: AC
  Administered 2016-03-10: 10 mg via INTRAVENOUS
  Filled 2016-03-10: qty 1

## 2016-03-10 MED ORDER — LEVOTHYROXINE SODIUM 75 MCG PO TABS
75.0000 ug | ORAL_TABLET | Freq: Every day | ORAL | Status: DC
  Administered 2016-03-11 – 2016-03-26 (×16): 75 ug via ORAL
  Filled 2016-03-10 (×16): qty 1

## 2016-03-10 MED ORDER — ATORVASTATIN CALCIUM 40 MG PO TABS
40.0000 mg | ORAL_TABLET | Freq: Every day | ORAL | Status: DC
  Administered 2016-03-11 – 2016-03-25 (×15): 40 mg via ORAL
  Filled 2016-03-10 (×16): qty 1

## 2016-03-10 MED ORDER — ACETAMINOPHEN 325 MG PO TABS
325.0000 mg | ORAL_TABLET | ORAL | Status: DC | PRN
  Administered 2016-03-12 – 2016-03-18 (×3): 650 mg via ORAL
  Filled 2016-03-10 (×3): qty 2
  Filled 2016-03-10: qty 1

## 2016-03-10 MED ORDER — TRAZODONE HCL 50 MG PO TABS: 25.0000 mg | ORAL_TABLET | Freq: Every evening | ORAL | Status: DC | PRN

## 2016-03-10 MED ORDER — INSULIN ASPART 100 UNIT/ML ~~LOC~~ SOLN
0.0000 [IU] | Freq: Three times a day (TID) | SUBCUTANEOUS | Status: DC
  Administered 2016-03-11: 2 [IU] via SUBCUTANEOUS
  Administered 2016-03-11: 3 [IU] via SUBCUTANEOUS
  Administered 2016-03-12: 2 [IU] via SUBCUTANEOUS
  Administered 2016-03-12: 3 [IU] via SUBCUTANEOUS
  Administered 2016-03-13 (×2): 2 [IU] via SUBCUTANEOUS
  Administered 2016-03-13: 3 [IU] via SUBCUTANEOUS
  Administered 2016-03-14: 5 [IU] via SUBCUTANEOUS
  Administered 2016-03-14: 3 [IU] via SUBCUTANEOUS
  Administered 2016-03-14 – 2016-03-15 (×2): 2 [IU] via SUBCUTANEOUS
  Administered 2016-03-15 – 2016-03-16 (×3): 3 [IU] via SUBCUTANEOUS
  Administered 2016-03-16 – 2016-03-17 (×5): 2 [IU] via SUBCUTANEOUS
  Administered 2016-03-18 (×2): 3 [IU] via SUBCUTANEOUS
  Administered 2016-03-19: 2 [IU] via SUBCUTANEOUS
  Administered 2016-03-19: 3 [IU] via SUBCUTANEOUS
  Administered 2016-03-20: 2 [IU] via SUBCUTANEOUS
  Administered 2016-03-20: 5 [IU] via SUBCUTANEOUS
  Administered 2016-03-21 (×2): 2 [IU] via SUBCUTANEOUS
  Administered 2016-03-21 – 2016-03-22 (×2): 3 [IU] via SUBCUTANEOUS
  Administered 2016-03-22: 2 [IU] via SUBCUTANEOUS
  Administered 2016-03-22: 3 [IU] via SUBCUTANEOUS
  Administered 2016-03-23: 2 [IU] via SUBCUTANEOUS
  Administered 2016-03-23: 8 [IU] via SUBCUTANEOUS
  Administered 2016-03-24 (×3): 2 [IU] via SUBCUTANEOUS
  Administered 2016-03-25 (×2): 3 [IU] via SUBCUTANEOUS
  Administered 2016-03-25: 5 [IU] via SUBCUTANEOUS
  Administered 2016-03-26 (×2): 2 [IU] via SUBCUTANEOUS

## 2016-03-10 MED ORDER — PROCHLORPERAZINE MALEATE 5 MG PO TABS: 5.0000 mg | ORAL_TABLET | Freq: Four times a day (QID) | ORAL | Status: DC | PRN

## 2016-03-10 MED ORDER — LORATADINE 10 MG PO TABS
10.0000 mg | ORAL_TABLET | Freq: Every day | ORAL | Status: DC
  Administered 2016-03-10 – 2016-03-26 (×17): 10 mg via ORAL
  Filled 2016-03-10 (×17): qty 1

## 2016-03-10 MED ORDER — PROCHLORPERAZINE 25 MG RE SUPP: 12.5000 mg | Freq: Four times a day (QID) | RECTAL | Status: DC | PRN

## 2016-03-10 MED ORDER — DIPHENHYDRAMINE HCL 12.5 MG/5ML PO ELIX: 12.5000 mg | ORAL_SOLUTION | Freq: Four times a day (QID) | ORAL | Status: DC | PRN

## 2016-03-10 MED ORDER — BISACODYL 10 MG RE SUPP
10.0000 mg | Freq: Every day | RECTAL | Status: DC | PRN
  Administered 2016-03-17: 10 mg via RECTAL
  Filled 2016-03-10 (×2): qty 1

## 2016-03-10 MED ORDER — CLOPIDOGREL BISULFATE 75 MG PO TABS
75.0000 mg | ORAL_TABLET | Freq: Every day | ORAL | Status: DC
  Administered 2016-03-10 – 2016-03-26 (×17): 75 mg via ORAL
  Filled 2016-03-10 (×17): qty 1

## 2016-03-10 MED ORDER — CARVEDILOL 6.25 MG PO TABS
6.2500 mg | ORAL_TABLET | Freq: Two times a day (BID) | ORAL | Status: DC
  Administered 2016-03-11 – 2016-03-26 (×31): 6.25 mg via ORAL
  Filled 2016-03-10 (×33): qty 1

## 2016-03-10 MED ORDER — PANTOPRAZOLE SODIUM 40 MG PO TBEC
40.0000 mg | DELAYED_RELEASE_TABLET | Freq: Every day | ORAL | Status: DC
  Administered 2016-03-10 – 2016-03-25 (×16): 40 mg via ORAL
  Filled 2016-03-10 (×16): qty 1

## 2016-03-10 MED ORDER — PROCHLORPERAZINE EDISYLATE 5 MG/ML IJ SOLN: 5.0000 mg | Freq: Four times a day (QID) | INTRAMUSCULAR | Status: DC | PRN

## 2016-03-10 MED ORDER — SENNOSIDES-DOCUSATE SODIUM 8.6-50 MG PO TABS
1.0000 | ORAL_TABLET | Freq: Two times a day (BID) | ORAL | Status: DC
  Administered 2016-03-12 – 2016-03-25 (×25): 1 via ORAL
  Filled 2016-03-10 (×29): qty 1

## 2016-03-10 MED ORDER — GUAIFENESIN-DM 100-10 MG/5ML PO SYRP: 5.0000 mL | ORAL_SOLUTION | Freq: Four times a day (QID) | ORAL | Status: DC | PRN

## 2016-03-10 MED ORDER — ENOXAPARIN SODIUM 40 MG/0.4ML ~~LOC~~ SOLN
40.0000 mg | SUBCUTANEOUS | Status: DC
  Administered 2016-03-10 – 2016-03-25 (×16): 40 mg via SUBCUTANEOUS
  Filled 2016-03-10 (×16): qty 0.4

## 2016-03-10 MED ORDER — ASPIRIN 325 MG PO TABS
325.0000 mg | ORAL_TABLET | Freq: Every day | ORAL | Status: DC
  Administered 2016-03-10: 325 mg via ORAL
  Filled 2016-03-10: qty 1

## 2016-03-10 NOTE — Progress Notes (Signed)
Physical Therapy Treatment Patient Details Name: [name] Colon MRN: [id number] DOB: Aug 23, 1920 Today's Date: 03/10/2016    History of Present Illness [name] Colon is a 82 y.o. female admitted s/p fall and code stroke called while pt in ED.  MRI revealed infarcts in posterior Lt MCA territory and superimposed small acute lacunar infarct in the Rt thalamas. Noted on CT to have subdural hematoma and scalp hematoma from a fall as well. PMH includes thyroid disease, claudication, hypertension and diabetes. Her blood pressure was poorly controlled on admission.    PT Comments    Pt con't to have dense R hemi plegia, appears to be getting depressed as pt stated "I wish they would've let me die." Pt frustrated she can't use R side but reports she wants to cont therapy. Pt remains to require maxAx2 for OOB mobility. con't to recommend CIR upon d/c for maximal functional recovery.  Follow Up Recommendations  CIR;Supervision/Assistance - 24 hour     Equipment Recommendations       Recommendations for Other Services Rehab consult     Precautions / Restrictions Precautions Precautions: Fall Restrictions Weight Bearing Restrictions: No    Mobility  Bed Mobility Overal bed mobility: Needs Assistance Bed Mobility: Rolling;Sidelying to Sit Rolling: Max assist Sidelying to sit: Mod assist;Max assist;+2 for physical assistance       General bed mobility comments: max A for trunk elevation and max directional v/c's, attempted to teach pt how to hook L LE under R LE to move off bed however unsuccesful  Transfers Overall transfer level: Needs assistance Equipment used:  (2 person lift with gait belt) Transfers: Sit to/from [employer] Transfers Sit to Stand: Max assist;+2 physical assistance Stand pivot transfers: Max assist;+2 physical assistance       General transfer comment: completed 3 std pvts to chair and BSC, pt assisted more with L UE and LE this date but unable to  step and R side remains flaccid.   Ambulation/Gait             General Gait Details: unable   Stairs            Wheelchair Mobility    Modified Rankin (Stroke Patients Only) Modified Rankin (Stroke Patients Only) Pre-Morbid Rankin Score: No significant disability Modified Rankin: Severe disability     Balance Overall balance assessment: Needs assistance Sitting-balance support: Single extremity supported Sitting balance-Leahy Scale: Poor Sitting balance - Comments: unable to use R UE to assist, lean to the L, requiring modA to maintain midline and upright posture Postural control: Left lateral lean   Standing balance-Leahy Scale: Zero Standing balance comment: attempted to pull up into standing using bed rail. pt required maxA at posterior pelvis to achieve hip extension and required R knee blocking to prevent buckling, pt unable to use R UE to assist due to flaccidity                    Cognition Arousal/Alertness: Awake/alert Behavior During Therapy: WFL for tasks assessed/performed Overall Cognitive Status: Within Functional Limits for tasks assessed                      Exercises      General Comments General comments (skin integrity, edema, etc.): pt with episode of urinary incontinence, pt dependent for hygiene      Pertinent Vitals/Pain Pain Assessment: Faces Faces Pain Scale: Hurts even more Pain Location: R UE and LE with mvmt Pain Descriptors / Indicators: Grimacing  Pain Intervention(s): Monitored during session    Home Living                      Prior Function            PT Goals (current goals can now be found in the care plan section) Acute Rehab PT Goals Patient Stated Goal: none stated Progress towards PT goals: Progressing toward goals    Frequency  Min 4X/week    PT Plan Current plan remains appropriate    Co-evaluation             End of Session Equipment Utilized During Treatment: Gait  belt Activity Tolerance: Patient tolerated treatment well Patient left: in chair;with call bell/phone within reach;with chair alarm set     Time: [id number] PT Time Calculation (min) (ACUTE ONLY): 35 min  Charges:  $Therapeutic Activity: 8-22 mins $Neuromuscular Re-education: 8-22 mins                    G Codes:      Marcene BrawnChadwell, Ruhee Enck Marie 03/10/2016, 2:22 PM   Lewis ShockAshly Emmaleigh Longo, PT, DPT Pager #: [phone] Office #: [phone]

## 2016-03-10 NOTE — H&P (View-Only) (Signed)
Physical Medicine and Rehabilitation Admission H&P    Chief Complaint  Patient presents with  .    HPI:   [name] Colon is a 82 y.o. female with history of HTN, DM who fell and struck her head with LOC about 10 seconds and was noted to have MS changes on 03/05/16. She was evaluated in ED and CT head negative but BP elevated with SBP > 250. She was started on IV labetalol and was found to flaccid right hemiparesis. Repeat CT head showed small acute Left cerebellar and left posterior temporal lobe SDH with moderate right scalp hematoma and moderate to severe small vessel disease. CTA head/neck showed < 50% right ICA, 80-90% L-ICA stenosis and severe centrilobar emphysema with apical bullous changes. 2D echo with EF 60-65%, moderate LVH, moderate pulmonary HTN and grade 2 diastolic dysfunction.  Bilateral hip films negative for fracture. MRI brain showed acute cortical infarcts posterior L-MCA peri-rolandic cortex superimposed small acute lacunar infarcts right thalamus and small Left occipital and posterior hemorraghic contusions. Dr. Leonie Man consulted and felt that patient with Left MCA and L-PCA infarcts due to rapid BP lowering in setting of severe L-ICA stenosis but A fib still in DDX.   Follow up CCT 4/21was stable and ASA recommended but patient has declined this due to history of GI SE. Changed to plavix today. Repeat CT head today with acute non-hemorrhagic small to moderate posterior left frontal - parietal infarct and no evidence of hemorrhagic conversion and small SDH  left occipital lobe without change. VVS consulted for input on ICA stenosis and patient to follow up in office. Patient with resultant dense right hemiplegia and dizziness affecting mobility and ability to carry out ADLs.  Therapy ongoing and CIR recommended for follow up therapy.    Review of Systems  HENT: Positive for hearing loss.   Eyes: Negative for blurred vision and double vision.  Respiratory: Negative for  cough and shortness of breath.   Cardiovascular: Negative for chest pain and palpitations.  Gastrointestinal: Negative for heartburn, abdominal pain and constipation.  Genitourinary: Negative for dysuria and frequency.  Musculoskeletal: Positive for myalgias and joint pain.  Skin: Negative for rash.  Neurological: Positive for tingling, sensory change, focal weakness and weakness. Negative for dizziness and headaches.  Psychiatric/Behavioral: Positive for depression. The patient has insomnia (hard to sleep in the hospital ).       Past Medical History  Diagnosis Date  . Hypertension   . Diabetes mellitus   . Thyroid disease   . Claudication [facility])     Past Surgical History  Procedure Laterality Date  . Appendectomy    . Tonsillectomy    . Cataract extraction      Family History  Problem Relation Age of Onset  . Heart failure Mother   . Lung disease Father     Black Lung disease    Social History:  Widowed. 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 Lives with son who works. She was independent and driving PTA. Son helps with housework and she eats out frequently. Plans on d/c to daughter in [place]?  She smoked for 20+ years and was exposed to second hand smoke (husbandP. She does not have any smokeless tobacco history on file. She reports that she does not drink alcohol or use illicit drugs.    Allergies  Allergen Reactions  . Doxycycline Anaphylaxis  . Alendronate Sodium Rash  . Losartan Potassium Rash  . Sulfa Antibiotics Rash  .  Aspirin Nausea And Vomiting  . Penicillins Rash    Facility-administered medications prior to admission  Medication Dose Route Frequency Provider Last Rate Last Dose  . triamcinolone acetonide (KENALOG) 10 MG/ML injection 10 mg  10 mg Other Once Wallene Huh, DPM       Medications Prior to Admission  Medication Sig Dispense Refill  . carvedilol (COREG) 6.25 MG tablet Take 6.25 mg by mouth 2 (two) times daily.      . Cholecalciferol 2000 UNITS CAPS Take 2,000 Units by mouth daily.    . cloNIDine (CATAPRES) 0.1 MG tablet Take 0.1 mg by mouth 3 (three) times daily.  5  . levothyroxine (SYNTHROID, LEVOTHROID) 75 MCG tablet Take 75 mcg by mouth daily before breakfast.    . loratadine (CLARITIN) 10 MG tablet Take 10 mg by mouth daily.    . primidone (MYSOLINE) 250 MG tablet Take 250 mg by mouth at bedtime.     . Naftifine HCl (NAFTIN) 2 % CREA Apply 1 drop topically 1 day or 1 dose. (Patient not taking: Reported on 03/05/2016) 60 g 2    Home: Home Living Family/patient expects to be discharged to:: Unsure Living Arrangements: Children Available Help at Discharge: Family (son) Type of Home: House Home Access: Stairs to enter (2 in rise) Technical brewer of Steps: 1 Home Layout: One level Bathroom Shower/Tub: Public librarian, Multimedia programmer: Standard Home Equipment: Radio producer - single point, Industrial/product designer History: Prior Function Level of Independence: Independent Comments: was driving  Functional Status:  Mobility: Bed Mobility Overal bed mobility: Needs Assistance Bed Mobility: Rolling, Sidelying to Sit Rolling: Max assist Sidelying to sit: Mod assist, Max assist, +2 for physical assistance Supine to sit: Max assist Sit to supine: Max assist General bed mobility comments: max A for trunk elevation and max directional v/c's, attempted to teach pt how to hook L LE under R LE to move off bed however unsuccesful Transfers Overall transfer level: Needs assistance Equipment used:  (2 person lift with gait belt) Transfers: Sit to/from Stand, Stand Pivot Transfers Sit to Stand: Max assist, +2 physical assistance Stand pivot transfers: Max assist, +2 physical assistance General transfer comment: completed 3 std pvts to chair and BSC, pt assisted more with L UE and LE this date but unable to step and R side remains flaccid.  Ambulation/Gait General Gait Details: unable     ADL: ADL Overall ADL's : Needs assistance/impaired Eating/Feeding: Set up, Supervision/ safety, Bed level Upper Body Dressing : Maximal assistance, Bed level Lower Body Dressing: Total assistance, Bed level General ADL Comments: sat EOB in session-became dizzy. Discussed positioning of Rt UE and positioning it on pillows at end of session.   Cognition: Cognition Overall Cognitive Status: Within Functional Limits for tasks assessed Arousal/Alertness: Lethargic Orientation Level: Oriented X4 Attention: Focused, Sustained Focused Attention: Impaired Focused Attention Impairment: Verbal basic, Functional basic Sustained Attention: Impaired Sustained Attention Impairment: Verbal basic, Functional basic Memory:  (will assess) Awareness:  (will assess) Problem Solving: Impaired Cognition Arousal/Alertness: Awake/alert Behavior During Therapy: WFL for tasks assessed/performed Overall Cognitive Status: Within Functional Limits for tasks assessed   Blood pressure 135/46, pulse 65, temperature 97.9 F (36.6 C), temperature source Oral, resp. rate 19, height 5' 5" (1.651 m), weight 61.6 kg (135 lb 12.9 oz), SpO2 100 %. Physical Exam  Nursing note and vitals reviewed. Constitutional: She is oriented to person, place, and time. She appears well-developed and well-nourished.  HENT:  Head: Normocephalic.  Mouth/Throat: Oropharyngeal exudate present.  Eyes: Conjunctivae are normal. Pupils are equal, round, and reactive to light.  Neck: Normal range of motion. Neck supple.  Cardiovascular: Normal rate and regular rhythm.   Murmur heard. Respiratory: Effort normal and breath sounds normal. No respiratory distress. She exhibits no tenderness.  GI: Soft. Bowel sounds are normal. She exhibits no distension. There is no tenderness.  Musculoskeletal: She exhibits edema and tenderness (Right hip with attempts at ROM).  Right heel with blister medially with central dark area.  Callus left heel  medially. Right shoulder very tender with palpation and PROM.   Neurological: She is alert and oriented to person, place, and time. A cranial nerve deficit is present.  Soft voice with delayed verbal output. Does have fair insight and awareness. HOH?. Right inattention with right field cut. Right central 7.  Dense right hemiparesis 0/5 in the right upper extremity to lower extremity proximal to distal. Right hamstring with early tone while in supine.   Skin: Skin is warm and dry.  Psychiatric: Her speech is delayed. She is slowed and withdrawn. She is inattentive.    Results for orders placed or performed during the hospital encounter of 03/05/16 (from the past 48 hour(s))  Glucose, capillary     Status: Abnormal   Collection Time: 03/08/16  7:08 PM  Result Value Ref Range   Glucose-Capillary 162 (H) 65 - 99 mg/dL  Glucose, capillary     Status: Abnormal   Collection Time: 03/08/16 10:56 PM  Result Value Ref Range   Glucose-Capillary 124 (H) 65 - 99 mg/dL  Glucose, capillary     Status: Abnormal   Collection Time: 03/09/16 12:17 AM  Result Value Ref Range   Glucose-Capillary 130 (H) 65 - 99 mg/dL  CBC     Status: None   Collection Time: 03/09/16  5:32 AM  Result Value Ref Range   WBC 5.8 4.0 - 10.5 K/uL   RBC 4.54 3.87 - 5.11 MIL/uL   Hemoglobin 13.5 12.0 - 15.0 g/dL   HCT 41.1 36.0 - 46.0 %   MCV 90.5 78.0 - 100.0 fL   MCH 29.7 26.0 - 34.0 pg   MCHC 32.8 30.0 - 36.0 g/dL   RDW 13.6 11.5 - 15.5 %   Platelets 181 150 - 400 K/uL  Basic metabolic panel     Status: Abnormal   Collection Time: 03/09/16  5:32 AM  Result Value Ref Range   Sodium 136 135 - 145 mmol/L   Potassium 4.2 3.5 - 5.1 mmol/L   Chloride 103 101 - 111 mmol/L   CO2 20 (L) 22 - 32 mmol/L   Glucose, Bld 134 (H) 65 - 99 mg/dL   BUN 19 6 - 20 mg/dL   Creatinine, Ser 0.78 0.44 - 1.00 mg/dL   Calcium 8.6 (L) 8.9 - 10.3 mg/dL   GFR calc non Af Amer >60 >60 mL/min   GFR calc Af Amer >60 >60 mL/min    Comment:  (NOTE) The eGFR has been calculated using the CKD EPI equation. This calculation has not been validated in all clinical situations. eGFR's persistently <60 mL/min signify possible Chronic Kidney Disease.    Anion gap 13 5 - 15  Glucose, capillary     Status: Abnormal   Collection Time: 03/09/16  8:19 AM  Result Value Ref Range   Glucose-Capillary 140 (H) 65 - 99 mg/dL  Glucose, capillary     Status: Abnormal   Collection Time: 03/09/16 12:13 PM  Result Value Ref Range   Glucose-Capillary 212 (  H) 65 - 99 mg/dL  Glucose, capillary     Status: Abnormal   Collection Time: 03/09/16  5:16 PM  Result Value Ref Range   Glucose-Capillary 160 (H) 65 - 99 mg/dL  Glucose, capillary     Status: Abnormal   Collection Time: 03/09/16  7:48 PM  Result Value Ref Range   Glucose-Capillary 108 (H) 65 - 99 mg/dL  Glucose, capillary     Status: Abnormal   Collection Time: 03/09/16 11:57 PM  Result Value Ref Range   Glucose-Capillary 133 (H) 65 - 99 mg/dL  CBC     Status: None   Collection Time: 03/10/16  7:36 AM  Result Value Ref Range   WBC 4.5 4.0 - 10.5 K/uL   RBC 4.60 3.87 - 5.11 MIL/uL   Hemoglobin 13.4 12.0 - 15.0 g/dL   HCT 41.4 36.0 - 46.0 %   MCV 90.0 78.0 - 100.0 fL   MCH 29.1 26.0 - 34.0 pg   MCHC 32.4 30.0 - 36.0 g/dL   RDW 13.4 11.5 - 15.5 %   Platelets 175 150 - 400 K/uL  Basic metabolic panel     Status: Abnormal   Collection Time: 03/10/16  7:36 AM  Result Value Ref Range   Sodium 135 135 - 145 mmol/L   Potassium 3.7 3.5 - 5.1 mmol/L   Chloride 104 101 - 111 mmol/L   CO2 19 (L) 22 - 32 mmol/L   Glucose, Bld 149 (H) 65 - 99 mg/dL   BUN 15 6 - 20 mg/dL   Creatinine, Ser 0.72 0.44 - 1.00 mg/dL   Calcium 8.7 (L) 8.9 - 10.3 mg/dL   GFR calc non Af Amer >60 >60 mL/min   GFR calc Af Amer >60 >60 mL/min    Comment: (NOTE) The eGFR has been calculated using the CKD EPI equation. This calculation has not been validated in all clinical situations. eGFR's persistently <60 mL/min  signify possible Chronic Kidney Disease.    Anion gap 12 5 - 15  Glucose, capillary     Status: Abnormal   Collection Time: 03/10/16  7:55 AM  Result Value Ref Range   Glucose-Capillary 131 (H) 65 - 99 mg/dL  Glucose, capillary     Status: Abnormal   Collection Time: 03/10/16 12:09 PM  Result Value Ref Range   Glucose-Capillary 183 (H) 65 - 99 mg/dL   Ct Head Wo Contrast  03/10/2016  CLINICAL DATA:  89-year-old hypertensive diabetic female post fall with right-sided weakness. Subsequent encounter. EXAM: CT HEAD WITHOUT CONTRAST TECHNIQUE: Contiguous axial images were obtained from the base of the skull through the vertex without intravenous contrast. COMPARISON:  03/06/2016 CT and MR. FINDINGS: Acute nonhemorrhagic small to moderate-size posterior left frontal-parietal infarct without CT evidence of hemorrhagic conversion. MR detected tiny left occipital lobe, right thalamic and anterior left cerebellar infarcts better delineated on recent MR. Prominent chronic micro vascular white matter changes. Small subdural hematoma left occipital lobe extending along the left tentorium without change. Small posterior lateral left temporal lobe hemorrhagic contusion measures up to 1.2 cm without significant change. Slight increased in amount of adjacent parenchymal edema. Global atrophy. Ventricular prominence probably related to atrophy rather than hydrocephalus and without change. Post lens replacement.  Exophthalmos. No skull fracture detected. Lucency right parietal calvarium unchanged. Mastoid air cells, middle ear cavities and visualized paranasal sinuses are clear. IMPRESSION: Acute nonhemorrhagic small to moderate-size posterior left frontal-parietal infarct. No CT evidence of hemorrhagic conversion. MR detected tiny left occipital lobe, right thalamic   and anterior left cerebellar infarcts better delineated on recent MR. Small subdural hematoma left occipital lobe extending along the left tentorium without  change. Small posterior lateral left temporal lobe hemorrhagic contusion measures up to 1.2 cm without significant change. Slight increased in amount of adjacent parenchymal edema. Small vessel disease and global atrophy stable. Electronically Signed   By: Genia Del M.D.   On: 03/10/2016 05:58       Medical Problem List and Plan: 1.  Right hemiparesis and cognitive deficits secondary to TBI and L-MCA infarcts.  2.  DVT Prophylaxis/Anticoagulation: Pharmaceutical: Lovenox 3. Pain Management: Tylenol prn  -right shoulder pain. Suspicious for injury related to fall  -will check xrays of right shoulder   -support RUE/shoulder as possible 4. Mood: Team to provide ego support.  LCSW to follow for evaluation and support.  5. Neuropsych: This patient is not fully  capable of making decisions on her own behalf. 6. Skin/Wound Care: Routine pressure relief measures.  Maintain adequate nutrition and hydration status. Will order prevaon boots.  7. Fluids/Electrolytes/Nutrition:  Monitor I/O. Check lytes in am.  8. HTN: Monitor BP tid - continue coreg bid and Catapres tid. 9. DM type 2: Hgb A1c- 7.5. Check blood sugars ac/hs. Use SSI for elevated BS--will likely need oral agent as BS variable. Marland Kitchen       Post Admission Physician Evaluation: 1. Functional deficits secondary  to TBI, and left mca infarct. 2. Patient is admitted to receive collaborative, interdisciplinary care between the physiatrist, rehab nursing staff, and therapy team. 3. Patient's level of medical complexity and substantial therapy needs in context of that medical necessity cannot be provided at a lesser intensity of care such as a SNF. 4. Patient has experienced substantial functional loss from his/her baseline which was documented above under the "Functional History" and "Functional Status" headings.  Judging by the patient's diagnosis, physical exam, and functional history, the patient has potential for functional progress which  will result in measurable gains while on inpatient rehab.  These gains will be of substantial and practical use upon discharge  in facilitating mobility and self-care at the household level. 5. Physiatrist will provide 24 hour management of medical needs as well as oversight of the therapy plan/treatment and provide guidance as appropriate regarding the interaction of the two. 6. 24 hour rehab nursing will assist with bladder management, bowel management, safety, skin/wound care, disease management, medication administration, pain management and patient education  and help integrate therapy concepts, techniques,education, etc. 7. PT will assess and treat for/with: Lower extremity strength, range of motion, stamina, balance, functional mobility, safety, adaptive techniques and equipment, NMR, pain mgt, splinting, w/c asessment, family ed, ego support.   Goals are:  mod assist. 8. OT will assess and treat for/with: ADL's, functional mobility, safety, upper extremity strength, adaptive techniques and equipment, NMR, splinting, pain control, ego support, family ed.   Goals are: mod to max assist. Therapy may not yet proceed with showering this patient. 9. SLP will assess and treat for/with: cognition, language, communication, swallowing, education.  Goals are: supervision to min assist. 10. Case Management and Social Worker will assess and treat for psychological issues and discharge planning. 11. Team conference will be held weekly to assess progress toward goals and to determine barriers to discharge. 12. Patient will receive at least 3 hours of therapy per day at least 5 days per week. 13. ELOS: 20-25 days       14. Prognosis:  excellent     Alroy Dust T.  Talena Neira, MD, FAAPMR [place] 03/10/2016   03/10/2016 

## 2016-03-10 NOTE — Discharge Summary (Signed)
Stroke Discharge Summary  Patient ID: [name] Colon   MRN: [id number]      DOB: 1920-01-13  Date of Admission: 03/05/2016 Date of Discharge: 03/10/2016  Attending Physician:  Marvel Plan, MD, Stroke MD Consulting Physician(s):  Fabienne Bruns, MD (vascular surgery ), Maryla Morrow, MD (Physical Medicine & Rehabtilitation)  Patient's PCP:  Willow Ora, MD  Discharge Diagnoses:  1. Stroke: Left MCA territory infarcts, but also left MCA/PCA, right thalamus and left SCA small infarcts 2. moderate to severe intracranial atherosclerosis 3. right hemiplegia 4. Small left temporoparietal SDH - traumatic 5. Carotid stenosis, left 6. Hypertensive Emergency 7. Hyperlipidemia 8. Diabetes 9. Scalp laceration  Past Medical History  Diagnosis Date  . Hypertension   . Diabetes mellitus   . Thyroid disease   . Claudication [facility])    Past Surgical History  Procedure Laterality Date  . Appendectomy    . Tonsillectomy    . Cataract extraction      Medications to be continued on Rehab . aspirin  325 mg Oral Daily  . atorvastatin  40 mg Oral q1800  . carvedilol  6.25 mg Oral BID WC  . cloNIDine  0.2 mg Oral TID  . heparin subcutaneous  5,000 Units Subcutaneous Q12H  . insulin aspart  0-15 Units Subcutaneous TID WC  . levothyroxine  75 mcg Oral QAC breakfast  . pantoprazole  40 mg Oral QHS  . primidone  250 mg Oral QHS  . senna-docusate  1 tablet Oral BID    LABORATORY STUDIES CBC    Component Value Date/Time   WBC 4.5 03/10/2016 0736   RBC 4.60 03/10/2016 0736   RBC 4.00 09/27/2013 0800   HGB 13.4 03/10/2016 0736   HCT 41.4 03/10/2016 0736   PLT 175 03/10/2016 0736   MCV 90.0 03/10/2016 0736   MCH 29.1 03/10/2016 0736   MCHC 32.4 03/10/2016 0736   RDW 13.4 03/10/2016 0736   LYMPHSABS 1.1 03/06/2016 0032   MONOABS 0.4 03/06/2016 0032   EOSABS 0.1 03/06/2016 0032   BASOSABS 0.0 03/06/2016 0032   CMP    Component Value Date/Time   NA 135 03/10/2016 0736   K 3.7  03/10/2016 0736   CL 104 03/10/2016 0736   CO2 19* 03/10/2016 0736   GLUCOSE 149* 03/10/2016 0736   BUN 15 03/10/2016 0736   CREATININE 0.72 03/10/2016 0736   CALCIUM 8.7* 03/10/2016 0736   PROT 7.6 03/06/2016 0032   ALBUMIN 3.7 03/06/2016 0032   AST 24 03/06/2016 0032   ALT 17 03/06/2016 0032   ALKPHOS 77 03/06/2016 0032   BILITOT 0.8 03/06/2016 0032   GFRNONAA >60 03/10/2016 0736   GFRAA >60 03/10/2016 0736   COAGS Lab Results  Component Value Date   INR 1.21 03/06/2016   Lipid Panel    Component Value Date/Time   CHOL 191 03/07/2016 0215   TRIG 84 03/07/2016 0215   HDL 46 03/07/2016 0215   CHOLHDL 4.2 03/07/2016 0215   VLDL 17 03/07/2016 0215   LDLCALC 128* 03/07/2016 0215   HgbA1C  Lab Results  Component Value Date   HGBA1C 7.5* 03/07/2016   Urinalysis    Component Value Date/Time   COLORURINE YELLOW 03/06/2016 0039   APPEARANCEUR CLOUDY* 03/06/2016 0039   LABSPEC 1.018 03/06/2016 0039   PHURINE 6.0 03/06/2016 0039   GLUCOSEU 250* 03/06/2016 0039   HGBUR TRACE* 03/06/2016 0039   BILIRUBINUR NEGATIVE 03/06/2016 0039   KETONESUR NEGATIVE 03/06/2016 0039   PROTEINUR 100* 03/06/2016 0039  UROBILINOGEN 0.2 03/12/2014 1427   NITRITE NEGATIVE 03/06/2016 0039   LEUKOCYTESUR MODERATE* 03/06/2016 0039   Urine Drug Screen     Component Value Date/Time   LABOPIA NONE DETECTED 03/06/2016 0038   COCAINSCRNUR NONE DETECTED 03/06/2016 0038   LABBENZ NONE DETECTED 03/06/2016 0038   AMPHETMU NONE DETECTED 03/06/2016 0038   THCU NONE DETECTED 03/06/2016 0038   LABBARB POSITIVE* 03/06/2016 0038    Alcohol Level    Component Value Date/Time   ETH <5 03/06/2016 0032     SIGNIFICANT DIAGNOSTIC STUDIES  Ct Head Wo Contrast 03/10/2016  Acute nonhemorrhagic small to moderate-size posterior left frontal-parietal infarct. No CT evidence of hemorrhagic conversion.  MR detected tiny left occipital lobe, right thalamic and anterior left cerebellar infarcts better  delineated on recent MR. Small subdural hematoma left occipital lobe extending along the left tentorium without change. Small posterior lateral left temporal lobe hemorrhagic contusion measures up to 1.2 cm without significant change. Slight increased in amount of adjacent parenchymal edema. Small vessel disease and global atrophy stable.  03/06/2016  Areas of subdural hematoma, stable from earlier in the day. No new foci of hemorrhage. No significant mass effect and no midline shift apparent. Atrophy with small vessel disease is stable. No acute infarct evident.   03/06/2016  Small acute subdural hematoma along LEFT cerebellar tentorium and LEFT posterior temporal lobe. Moderate RIGHT scalp hematoma. No skull fracture. Stable involutional changes and moderate to severe chronic small vessel ischemic disease.   03/05/2016  No acute intracranial abnormalities. Chronic atrophy and small vessel ischemic changes.  Degenerative changes in the cervical spine. Normal alignment.  CTA NECK 03/06/2016  80- 90% focal stenosis LEFT internal carotid artery origin. Less than 50% stenosis RIGHT internal carotid artery origin.   CTA HEAD 03/06/2016   No emergent large vessel occlusion. Moderate to severe stenosis bilateral cavernous internal carotid arteries due to calcific atherosclerosis.   Mr Brain Wo Contrast 03/06/2016  1. Acute cortically based infarcts in the posterior left MCA territory affecting the peri-Rolandic cortex. Gyral edema without associated mass effect or parenchymal hemorrhage here.  2. Small left subdural hematoma most apparent along the medial left occipital lobe. Superimposed small left posterior temporal lobe hemorrhagic contusion is favored over additional subdural blood.  3. Superimposed small acute lacunar infarct in the lateral right thalamus (right thalamus striate/PCA territory). And there might also be a small left superior cerebellar artery territory infarct  versus artifact from adjacent blood products. Favor synchronous small vessel disease for these rather than sequelae of recent embolic event.   Dg Knee Complete 4 Views Left 03/05/2016  Sclerosis in the distal left femur consistent with enchondroma or bone infarct. No acute fracture or dislocation.   Dg Shoulder Left Port 03/07/2016 IMPRESSION: No fracture or dislocation.   Dg Shoulder Right Port 03/07/2016 IMPRESSION: No fracture or dislocation.  Dg Hip Port Unilat With Pelvis 1v Left 03/07/2016 IMPRESSION: No fracture dislocation  Dg Hip Port Unilat With Pelvis 1v Right 03/07/2016 IMPRESSION: No pelvic fracture or hip fracture.   LE venous doppler - No evidence of DVT or baker's cyst.  2D echo  - Left ventricle: The cavity size was normal. There was moderate concentric hypertrophy. Systolic function was normal. The estimated ejection fraction was in the range of 60% to 65%. Wall motion was normal; there were no regional wall motion abnormalities. Features are consistent with a pseudonormal left ventricular filling pattern, with concomitant abnormal relaxation and increased filling pressure (grade 2 diastolic dysfunction). Doppler parameters are  consistent with high ventricular filling pressure. - Aortic valve: Moderately to severely calcified annulus. Severe diffuse thickening and calcification. Valve mobility was restricted. There was mild stenosis. There was trivial regurgitation. Valve area (VTI): 1.99 cm^2. Valve area (Vmax): 1.82 cm^2. Valve area (Vmean): 1.69 cm^2. - Mitral valve: Calcified annulus. Moderate diffuse thickening and calcification. Valve area by pressure half-time: 2.39 cm^2. - Left atrium: The atrium was severely dilated. - Atrial septum: No defect or patent foramen ovale was identified. - Pulmonary arteries: PA peak pressure: 67 mm Hg (S). Impressions: The right ventricular systolic pressure was increased consistent with moderate pulmonary hypertension.  CUS -  1-39% right ICA plaquing. 40-59% left ICA stenosis, highest end of scale, however, based on plaque formation and ICA/CCA ratio, cannot rule out a higher grade stenosis. Vertebral artery flow is antegrade.     HISTORY OF PRESENT ILLNES [name] Colon is a 82 y.o. female who fell down today and hit her head on the right side and came to the hospital for that reason. The CT in the ED was read as normal, and CT neck also normal. She was in the ED and felt to be completely normal at 10 pm 03/05/2016 ([facility]). Later she was found to have plegic right arm and leg. Code stroke called and Dr. Cena Benton came to see pt. Confirmed plegia. NIHSS = 9. CT head showed an unusual looking hyperdensity both in the first and the second scans of the head. He called to discuss the case with radiologist and a request for a 5 second delay exam after a CTA which he also ordered as part of the evaluation. This of course delayed decision for tPA because given the head trauma, even if minor, makes this hyperdensity likely to be a SDH.   Addendum - after long discussion with neuroradiologist it appears that there are blood products in the CT scan (subdural and scalp hematoma) that preclude the use of iv TPA in this patient. Her CTA also does not show an acute large vessel occlusion.   She was admitted to the neuro ICU for further evaluation and treatment.   HOSPITAL COURSE Ms. [name] Colon is a 82 y.o. female with history of hypertension, diabetes mellitus, thyroid disease, and claudication presenting with a recent fall with head injury. While in ED, developed right hemiplegia. CT showed small left tentorial SDH. She did not receive IV t-PA due to a subdural hematoma.  Stroke: Left MCA territory infarcts, but also left MCA/PCA, right thalamus and left SCA small infarcts, etiology not clear, but may be due to rapid BP lowering in the setting of high grade stenosis of left ICA and multifocal moderate to severe  atherosclerosis. Afib still in DDx  Resultant right hemiplegia  MRI - Acute cortically based infarcts in the posterior left MCA territory affecting the peri-Rolandic cortex, as well as punctate infarcts in left MCA/PCA, right thalamus and left SCA..  CTA of the neck - 80- 90% focal stenosis LEFT ICA origin, and moderate to severe stenosis bilateral cavernous ICA due to calcific atherosclerosis.  CUS - 40-59% left ICA stenosis, highest end of scale, however, based on plaque formation and ICA/CCA ratio, cannot rule out a higher grade stenosis.  2D Echo - EF 60-65%  Bilateral lower extremity venous duplex - negative for DVT  Repeat CT this am stable, no increase in size  LDL - 128  HgbA1c 7.5  No antithrombotic prior to admission. Treated with aspirin suppository 300 mg daily in the hospital as  Pt refused to take ASA po as ASA causing her upset stomach. Changed to plavix at discharge.  Ongoing aggressive stroke risk factor management  Therapy recommendations: SNF vs CIR   Disposition: CIR (Lives with son)  Outpt 30 day cardiac event monitoring to rule out afib requested from cardiology  Small left temporoparietal SDH - traumatic  Due to fall  Stable on repeat CT  Treated with ASA for stroke prevention in hospital  Repeat CT day of discharge stable - changed to plavix at discharge  Carotid stenosis, left  CTA showed left ICA 80-90%  CUS showed 40-59% left ICA stenosis, highest end of scale, however, based on plaque formation and ICA/CCA ratio, cannot rule out a higher grade stenosis.  Pt has left MCA infarct, likely due to rapid lowering BP  vascular surgery consulted. plan outpatient follow-up in 4-6 weeks.  No CEA this time (functional status 4 with complete hemiplegia, 82 years old, subdural hematoma, which may limit use of antiplatelets/anticoagulants)  Hypertensive Emergency  On admission BP as high as 269/97, and down to 144/47 in 3 hours  Remains  elevated, 170s-200s this am  BP goal 130-160, avoid hypotension  On coreg and clonidine 0.2 tid   Continue to monitor and adjust medication on rehab  Hyperlipidemia  Home meds: No lipid lowering medications prior to admission  LDL 128, goal < 70  Add Lipitor 40 mg daily  Continue statin at discharge  Diabetes  HgbA1c 7.5, goal < 7.0  SSI  CBG monitoring  Close follow up on rehab and with PCP  Other Stroke Risk Factors  Advanced age  Other Active Problems  Scalp laceration   DISCHARGE EXAM Blood pressure 135/46, pulse 65, temperature 97.9 F (36.6 C), temperature source Oral, resp. rate 19, height 5\' 5"  (1.651 m), weight 61.6 kg (135 lb 12.9 oz), SpO2 100 %. General - Well nourished, well developed, not in acute distress.  Ophthalmologic - Fundi not visualized due to noncooperation.  Cardiovascular - Regular rate and rhythm.  Mental Status -  Awake, alert, orientated to people and self, but not orientated to place, time or age. Language including expression, naming 2/2, repetition, comprehension was assessed and found intact.  Cranial Nerves II - XII - II - Visual field intact OU. III, IV, VI - Extraocular movements intact. V - Facial sensation intact bilaterally. VII - Facial movement intact bilaterally. VIII - Hearing & vestibular intact bilaterally. X - Palate elevates symmetrically. XI - Chin turning & shoulder shrug intact bilaterally. XII - Tongue protrusion intact.  Motor Strength - The patient's strength was 0/5 RUE and 2/5 RLE in the setting of right shoulder and hip pain on moving, 5/5 LUE and 4+/5 LLE due to left hip pain. Bulk was normal and fasciculations were absent.  Motor Tone - Muscle tone was assessed at the neck and appendages and was decreased at RUE.  Reflexes - The patient's reflexes were 1+ in all extremities and she had no pathological reflexes.  Sensory - symmetrical bilaterally but pain on b/l hip and right shoulder.    Coordination - no ataxia or dysmetria on the left hand. Tremor was absent.  Gait and Station - not tested due to safety concerns.    Discharge Diet  Diet regular Room service appropriate?: Yes; Fluid consistency:: Thin liquids  DISCHARGE PLAN  Disposition:  Transfer to [facility] for ongoing PT, OT and ST  clopidogrel 75 mg daily for secondary stroke prevention.  OP 30 d monitor requested from  cardiology  Recommend ongoing risk factor control by Primary Care Physician at time of discharge from inpatient rehabilitation.  Follow-up ANDY,CAMILLE L, MD in 2 weeks following discharge from rehab.  Follow-up with Dr. Marvel Plan, Stroke Clinic in 2 months, office to schedule an appointment  - or with neurologist in your area  45 minutes were spent preparing discharge.  Rhoderick Moody [facility] See Amion for Pager information 03/10/2016 4:43 PM   I, the attending vascular neurologist, have personally obtained a history, examined the patient, evaluated laboratory data, individually viewed imaging studies and agree with radiology interpretations.  Together with the NP/PA, we formulated the assessment and plan of care which reflects our mutual decision.  I have made any additions or clarifications directly to the above note and agree with the findings and plan as currently documented.   SNF today. Follow up in clinic in 2 months. BP goal 130-160.   Marvel Plan, MD PhD Stroke Neurology 03/11/2016 6:34 AM

## 2016-03-10 NOTE — PMR Pre-admission (Signed)
PMR Admission Coordinator Pre-Admission Assessment  Patient: [name] Colon is an 82 y.o., female MRN: [id number] DOB: 10/31/1920 Height: 5\' 5"  (165.1 cm) Weight: 61.6 kg (135 lb 12.9 oz)              Insurance Information HMO:     PPO:      PCP:      IPA:      80/20: yes     OTHER:  No HMO PRIMARY: Medicare A and B      Policy#: [id number] d      Subscriber: pt Benefits:  Phone #: passport one online     Name: 03/10/16 Eff. Date: 03/17/87     Deduct: $1316      Out of Pocket Max: none      Life Max: none CIR: 100%      SNF: 20 full days Outpatient: 80%   Co-Pay: 20% Home Health: 100%      Co-Pay: none DME: 80%     Co-Pay: 20% Providers: pt choice  SECONDARY: BCBS      Policy#: [id number]      Subscriber: pt  Medicaid Appliccation Date:       Case Manager:  Disability Application Date:       Case Worker:   Emergency Contact Information Contact Information    Name Relation Home Work [employer]   Corbo,Ralph Son [phone]  [phone]   Venita LickSanders,Laura Daughter [phone]       Current Medical History  Patient Admitting Diagnosis: TBI?SDH, acute cortical infarcts posteior L MCA, lacunar infarcts right thalamus, and left occipital and posterior hemorrhagic contusions  History of Present Illness: [name] Drapelizabeth M Floyd is a 82 y.o. female with history of HTN, DM who fell and struck her head with LOC about 10 seconds and was noted to have MS changes on 03/05/16. She was evaluated in ED and CT head negative but BP elevated with SBP > 250. She was started on IV labetalol and was found to flaccid right hemiparesis. Repeat CT head showed small acute Left cerebellar and left posterior temporal lobe SDH with moderate right scalp hematoma and moderate to severe small vessel disease. CTA head/neck showed < 50% right ICA, 80-90% L-ICA stenosis and severe centrilobar emphysema with apical bullous changes. 2D echo with EF 60-65%, moderate LVH, moderate pulmonary HTN and grade 2 diastolic dysfunction.  Bilateral hip films negative for fracture. MRI brain showed acute cortical infarcts posterior L-MCA peri-rolandic cortex superimposed small acute lacunar infarcts right thalamus and small Left occipital and posterior hemorraghic contusions. Dr. Pearlean BrownieSethi consulted and felt that patient with Left MCA and L-PCA infarcts due to rapid BP lowering in setting of severe L-ICA stenosis but A fib still in DDX.   Follow up CCT 4/21was stable and ASA recommended but patient has declined this due to history of GI SE. Repeat CT head today with acute non-hemorrhagic small to moderate posterior left frontal - parietal infarct and no evidence of hemorrhagic conversion and small SDH left occipital lobe without change. VVS consulted for input on ICA stenosis and patient to follow up in office. Patient with resultant dense right hemiplegia and dizziness affecting mobility and ability to carry out ADLs.    Total: 10 NIH    Past Medical History  Past Medical History  Diagnosis Date  . Hypertension   . Diabetes mellitus   . Thyroid disease   . Claudication [facility](HCC)     Family History  family history includes Heart failure in her mother; Lung disease in her  father.  Prior Rehab/Hospitalizations:  Has the patient had major surgery during 100 days prior to admission? No  Current Medications   Current facility-administered medications:  .  acetaminophen (TYLENOL) tablet 650 mg, 650 mg, Oral, Q4H PRN, 650 mg at 03/10/16 0840 **OR** [DISCONTINUED] acetaminophen (TYLENOL) suppository 650 mg, 650 mg, Rectal, Q4H PRN, Heron Sabins, MD .  aspirin tablet 325 mg, 325 mg, Oral, Daily, Marvel Plan, MD, 325 mg at 03/10/16 1119 .  atorvastatin (LIPITOR) tablet 40 mg, 40 mg, Oral, q1800, David L Rinehuls, PA-C, 40 mg at 03/09/16 1737 .  carvedilol (COREG) tablet 6.25 mg, 6.25 mg, Oral, BID WC, Heron Sabins, MD, 6.25 mg at 03/10/16 0803 .  cloNIDine (CATAPRES) tablet 0.2 mg, 0.2 mg, Oral, TID, Marvel Plan, MD, 0.2 mg at 03/10/16 1119 .   heparin injection 5,000 Units, 5,000 Units, Subcutaneous, Q12H, Marvel Plan, MD, 5,000 Units at 03/10/16 1119 .  hydrALAZINE (APRESOLINE) injection 10 mg, 10 mg, Intravenous, Q4H PRN, Heron Sabins, MD, 10 mg at 03/10/16 0502 .  insulin aspart (novoLOG) injection 0-15 Units, 0-15 Units, Subcutaneous, TID WC, Marvel Plan, MD, 3 Units at 03/10/16 1248 .  levothyroxine (SYNTHROID, LEVOTHROID) tablet 75 mcg, 75 mcg, Oral, QAC breakfast, Heron Sabins, MD, 75 mcg at 03/10/16 0803 .  ondansetron (ZOFRAN) injection 4 mg, 4 mg, Intravenous, Q6H PRN, Briscoe Deutscher, MD, 4 mg at 03/10/16 0840 .  pantoprazole (PROTONIX) EC tablet 40 mg, 40 mg, Oral, QHS, Bertram Millard, RPH, 40 mg at 03/09/16 2108 .  primidone (MYSOLINE) tablet 250 mg, 250 mg, Oral, QHS, Heron Sabins, MD, 250 mg at 03/09/16 2107 .  senna-docusate (Senokot-S) tablet 1 tablet, 1 tablet, Oral, BID, Heron Sabins, MD, 1 tablet at 03/10/16 1119  Patients Current Diet: Diet regular Room service appropriate?: Yes; Fluid consistency:: Thin  Precautions / Restrictions Precautions Precautions: Fall Restrictions Weight Bearing Restrictions: No   Has the patient had 2 or more falls or a fall with injury in the past year?No  Prior Activity Level Limited Community (1-2x/wk): Pt independent pta, driving and managing her own financila matters. Used Cane in the community. Very independent. 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 cane in the community  Prior Functional Level Prior Function Level of Independence: Independent Comments: was driving  Self Care: Did the patient need help bathing, dressing, using the toilet or eating?  Independent  Indoor Mobility: Did the patient need assistance with walking from room to room (with or without  device)? Independent  Stairs: Did the patient need assistance with internal or external stairs (with or without device)? Independent  Functional Cognition: Did the patient need help planning regular tasks such as shopping or remembering to take medications? Independent  Current Functional Level Cognition  Arousal/Alertness: Lethargic Overall Cognitive Status: Within Functional Limits for tasks assessed Orientation Level: Oriented X4 Attention: Focused, Sustained Focused Attention: Impaired Focused Attention Impairment: Verbal basic, Functional basic Sustained Attention: Impaired Sustained Attention Impairment: Verbal basic, Functional basic Memory:  (will assess) Awareness:  (will assess) Problem Solving: Impaired    Extremity Assessment (includes Sensation/Coordination)  Upper Extremity Assessment: RUE deficits/detail (reports shoulder surgery on bilateral shoulders) RUE Deficits / Details: flaccid; edema in digits RUE Sensation: decreased light touch RUE Coordination: decreased fine motor, decreased gross motor LUE Deficits / Details: generalized weakness  Lower Extremity Assessment:  Defer to PT evaluation RLE Deficits / Details: grossly flaccid, impaired senstation RLE Sensation: decreased light touch, decreased proprioception LLE Deficits / Details: generalized weakness    ADLs  Overall ADL's : Needs assistance/impaired Eating/Feeding: Set up, Supervision/ safety, Bed level Upper Body Dressing : Maximal assistance, Bed level Lower Body Dressing: Total assistance, Bed level General ADL Comments: sat EOB in session-became dizzy. Discussed positioning of Rt UE and positioning it on pillows at end of session.     Mobility  Overal bed mobility: Needs Assistance Bed Mobility: Rolling, Sidelying to Sit Rolling: Max assist Sidelying to sit: Mod assist, Max assist, +2 for physical assistance Supine to sit: Max assist Sit to supine: Max assist General bed mobility comments:  max A for trunk elevation and max directional v/c's, attempted to teach pt how to hook L LE under R LE to move off bed however unsuccesful    Transfers  Overall transfer level: Needs assistance Equipment used:  (2 person lift with gait belt) Transfers: Sit to/from Stand, Stand Pivot Transfers Sit to Stand: Max assist, +2 physical assistance Stand pivot transfers: Max assist, +2 physical assistance General transfer comment: completed 3 std pvts to chair and BSC, pt assisted more with L UE and LE this date but unable to step and R side remains flaccid.     Ambulation / Gait / Stairs / Wheelchair Mobility  Ambulation/Gait General Gait Details: unable    Posture / Balance Dynamic Sitting Balance Sitting balance - Comments: unable to use R UE to assist, lean to the L, requiring modA to maintain midline and upright posture Balance Overall balance assessment: Needs assistance Sitting-balance support: Single extremity supported Sitting balance-Leahy Scale: Poor Sitting balance - Comments: unable to use R UE to assist, lean to the L, requiring modA to maintain midline and upright posture Postural control: Left lateral lean Standing balance-Leahy Scale: Zero Standing balance comment: attempted to pull up into standing using bed rail. pt required maxA at posterior pelvis to achieve hip extension and required R knee blocking to prevent buckling, pt unable to use R UE to assist due to flaccidity    Special needs/care consideration  Skin blister noted to right upper buttocks Bowel mgmt: last BM 4/24. incontinent Bladder mgmt: incontinent Diabetic mgmt yes   Previous Home Environment Living Arrangements: Children (son, Rayna Sexton, lives with pt)  Lives With: Son Available Help at Discharge: Family, Other (Comment) (Caregiver, Burna Mortimer, to be arranged at d/c to provide 24/7 assi) Type of Home: House Home Layout: One level Home Access: Stairs to enter [employer] of Steps: 1 Bathroom  Shower/Tub: Hydrographic surveyor, Health visitor: Standard Bathroom Accessibility: Yes How Accessible: Accessible via walker Home Care Services: No  Discharge Living Setting Plans for Discharge Living Setting: Patient's home, Lives with (comment) (son lives with her) Type of Home at Discharge: House Discharge Home Layout: One level Discharge Home Access: Stairs to enter Entrance Stairs-Rails: None Entrance Stairs-Number of Steps: 1 Discharge Bathroom Shower/Tub: Tub/shower unit, Walk-in shower Discharge Bathroom Toilet: Standard Discharge Bathroom Accessibility: Yes How Accessible: Accessible via walker Does the patient have any problems obtaining your medications?: No  Social/Family/Support Systems Patient Roles: Parent Contact Information: Rayna Sexton, son local. Daughter Leotis Shames here now from [place] Anticipated Caregiver: Rayna Sexton and hired caregivers  Anticipated Industrial/product designer Information: see above Ability/Limitations of Caregiver: Hired caregiver is a freind of family who they contacted since admit to help. Caregiver Availability: 24/7 Discharge Plan Discussed with Primary Caregiver: Yes Is Caregiver In Agreement with Plan?: Yes  Does Caregiver/Family have Issues with Lodging/Transportation while Pt is in Rehab?: No  Goals/Additional Needs Patient/Family Goal for Rehab: Mod assist PT and OT, superviison SLP Expected length of stay: ELOS 14 days Equipment Needs: Cushion for when up in chair  Special Service Needs: Patient very worried that therapy will "push her' to extent of hurting her Additional Information: Pt would like to try inpt rehab , but if intensity too much , she will request to d/c to SNF rehab for slower pace program Pt/Family Agrees to Admission and willing to participate: Yes Program Orientation Provided & Reviewed with Pt/Caregiver Including Roles  & Responsibilities: Yes Information Needs to be Provided By: Leotis Shames and Rayna Sexton have list of SNFs  locally that they will tour in case of SNF rehab needed  Decrease burden of Care through IP rehab admission: pt will either d/c home with son, Rayna Sexton, and hired caregiver, Burna Mortimer or d/c to SNF locally in [place].  Possible need for SNF placement upon discharge:Pt is very concerned that she will be pushed in inpt rehab to the point of pain. She would like to try inpt rehab with goal to d/c home with son, Rayna Sexton and hired new caregiver , Burna Mortimer. If she can not do the intensity, she would then like to go to SNF rehab. Lauren and Rayna Sexton have a list of local SNFs that they are looking at in  Case needed. Lauren has offered for pt to move to [place], but I have the impression that pt does not want to leave [place]. Very self independent woman asking very appropriate overall questions.  Patient Condition: This patient's condition remains as documented in the consult dated 03/10/2016, in which the Rehabilitation Physician determined and documented that the patient's condition is appropriate for intensive rehabilitative care in an inpatient rehabilitation facility. Will admit to inpatient rehab today.  Preadmission Screen Completed By:  Clois Dupes, 03/10/2016 3:34 PM ______________________________________________________________________   Discussed status with Dr. Riley Kill on 03/10/2016 at  1533 and received telephone approval for admission today.  Admission Coordinator:  Clois Dupes, time 1610 Date 03/10/2016.

## 2016-03-10 NOTE — Progress Notes (Signed)
BSW intern presented bed offers to patient and patient daughter Leotis ShamesLauren. Patient daughter is apperceptive of the information from BSW intern and will look at facilities as a second options in case CIR does not go through.   Catheryn Baconharlean McNeil  BSW intern  [phone]   CSW will continue to follow  Merlyn LotJenna Holoman, Va San Diego Healthcare SystemCSWA Clinical Social Worker [phone]

## 2016-03-10 NOTE — Progress Notes (Signed)
I met with pt and her daughter for final dispo for rehab . They are in agreement to inpt rehab today. I have notified RN CM, SW and Burnetta Sabin, [facility]. I will make the arramgemtns to admit today. [phone]

## 2016-03-10 NOTE — Progress Notes (Signed)
Ankit Karis JubaAnil Patel, MD Physician Signed Physical Medicine and Rehabilitation Consult Note 03/09/2016 11:51 AM  Related encounter: ED to Hosp-Admission (Current) [facility]    Expand All Collapse All        Physical Medicine and Rehabilitation Consult  Reason for Consult: Right sided weakness and difficulty speaking.  Referring Physician: Dr. Roda ShuttersXu.    HPI: [name] Colon is a 82 y.o. female with history of HTN, DM who fell and struck her head with LOC about 10 seconds and was noted to have MS changes on 03/06/16. She was evaluated in ED and CT head negative but BP elevated with SBP > 250. She was started on IV labetalol and was found to flaccid right hemiparesis. Repeat CT head showed small acute Left cerebellar and left posterior temporal lobe SDH with moderate right scalp hematoma and moderate to severe small vessel disease. CTA head/neck showed < 50% right ICA, 80-90% L-ICA stenosis and severe centrilobar emphysema with apical bullous changes. 2D echo with EF 60-65%, moderate LVH, moderate pulmonary HTN and grade 2 diastolic dysfunction.  Bilateral hip films negative for fracture. MRI brain showed acute cortical infarcts posterior L-MCA peri-rolandic cortex superimposed small acute lacunar infarcts right thalamus and small Left occipital and posterior hemorraghic contusions. Dr. Pearlean BrownieSethi consulted and felt that patient with Left MCA and L_PCA infarcts due to rapid BP lowering in setting of severe L-ICA stenosis but A fib still in DDX. Repeat CCT stable and ASA recommended but patient has declined due to history of GI SE. VVS consulted for input on ICA stenosis. Patient's daughter at bedside, who does the majority of the talking.  Review of Systems  HENT: Positive for hearing loss.  Eyes: Negative for blurred vision and double vision.  Respiratory: Negative for cough and shortness of breath.  Cardiovascular: Negative for chest pain and  palpitations.  Gastrointestinal: Negative for heartburn, abdominal pain and diarrhea.   Incontinent of B/B  Musculoskeletal: Positive for myalgias, back pain and joint pain (right hip and right shoulder pain).  Neurological: Positive for speech change and focal weakness. Negative for headaches.  Psychiatric/Behavioral: The patient has insomnia.  All other systems reviewed and are negative.    Past Medical History  Diagnosis Date  . Hypertension   . Diabetes mellitus   . Thyroid disease   . Claudication [facility](HCC)     Past Surgical History  Procedure Laterality Date  . Appendectomy    . Tonsillectomy    . Cataract extraction      Family History  Problem Relation Age of Onset  . Heart failure Mother   . Lung disease Father     Black Lung disease    Social History: Widowed. 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 Lives with son. Independent and driving PTA. She reports that she has never smoked. She does not have any smokeless tobacco history on file. She reports that she does not drink alcohol or use illicit drugs.    Allergies  Allergen Reactions  . Doxycycline Anaphylaxis  . Alendronate Sodium Rash  . Losartan Potassium Rash  . Sulfa Antibiotics Rash  . Aspirin Nausea And Vomiting  . Penicillins Rash    Facility-administered medications prior to admission  Medication Dose Route Frequency Provider Last Rate Last Dose  . triamcinolone acetonide (KENALOG) 10 MG/ML injection 10 mg 10 mg Other Once Lenn SinkNorman S Regal, DPM     Medications Prior to Admission  Medication Sig Dispense Refill  .  carvedilol (COREG) 6.25 MG tablet Take 6.25 mg by mouth 2 (two) times daily.    . Cholecalciferol 2000 UNITS CAPS Take 2,000 Units by mouth daily.    . cloNIDine (CATAPRES) 0.1 MG tablet Take 0.1 mg by mouth 3 (three) times daily.  5  .  levothyroxine (SYNTHROID, LEVOTHROID) 75 MCG tablet Take 75 mcg by mouth daily before breakfast.    . loratadine (CLARITIN) 10 MG tablet Take 10 mg by mouth daily.    . primidone (MYSOLINE) 250 MG tablet Take 250 mg by mouth at bedtime.     . Naftifine HCl (NAFTIN) 2 % CREA Apply 1 drop topically 1 day or 1 dose. (Patient not taking: Reported on 03/05/2016) 60 g 2    Home: Home Living Family/patient expects to be discharged to:: Private residence Living Arrangements: Children Available Help at Discharge: (son) Type of Home: House Home Access: Stairs to enter (2 in rise) Secretary/administrator of Steps: 1 Home Layout: One level Bathroom Shower/Tub: Hydrographic surveyor, Health visitor: Standard Home Equipment: Cane - single point, Banker History: Prior Function Level of Independence: Independent Comments: was driving Functional Status:  Mobility: Bed Mobility Overal bed mobility: Needs Assistance, +2 for physical assistance Bed Mobility: Rolling, Sidelying to Sit Rolling: Mod assist Sidelying to sit: Mod assist, +2 for physical assistance, Max assist General bed mobility comments: max directional cues, unable to use R UE/LE functionally, limited ability to push up with L UE Transfers Overall transfer level: Needs assistance Equipment used: (2 person lift with gait belt) Transfers: Sit to/from Stand, Stand Pivot Transfers Sit to Stand: Max assist, +2 physical assistance Stand pivot transfers: Max assist, +2 physical assistance General transfer comment: max directional v/c's, unable to use R UE/LE functionally Ambulation/Gait General Gait Details: unable at this time    ADL:    Cognition: Cognition Overall Cognitive Status: Within Functional Limits for tasks assessed Arousal/Alertness: Lethargic Orientation Level: Oriented X4 Attention: Focused, Sustained Focused Attention: Impaired Focused Attention Impairment: Verbal  basic, Functional basic Sustained Attention: Impaired Sustained Attention Impairment: Verbal basic, Functional basic Memory: (will assess) Awareness: (will assess) Problem Solving: Impaired Cognition Arousal/Alertness: Awake/alert Behavior During Therapy: WFL for tasks assessed/performed Overall Cognitive Status: Within Functional Limits for tasks assessed   Blood pressure 189/49, pulse 67, temperature 98 F (36.7 C), temperature source Oral, resp. rate 18, height  (1.651 m), weight 61.6 kg (135 lb 12.9 oz), SpO2 96 %. Physical Exam  Nursing note and vitals reviewed. Constitutional: She is oriented to person, place, and time. She appears well-developed and well-nourished.  HENT:  Head: Normocephalic and atraumatic.  Eyes: Conjunctivae are normal. Pupils are equal, round, and reactive to light.  Neck: Normal range of motion. Neck supple.  Cardiovascular: Normal rate and regular rhythm.  No murmur heard. Respiratory: Effort normal and breath sounds normal. No respiratory distress. She has no wheezes.  GI: Soft. Bowel sounds are normal. She exhibits no distension. There is no tenderness.  Musculoskeletal: She exhibits tenderness. She exhibits no edema.  Neurological: She is alert and oriented to person, place, and time.  DTRs symmetric Motor: Left upper extremity: 4/5 proximal to distal Left lower extremity: Hip flexion, knee extension 4 -/5, ankle dorsi/plantarflexion 5/5  Skin: Skin is warm and dry.  Psychiatric: She has a normal mood and affect. Her behavior is normal.     Lab Results Last 24 Hours    Results for orders placed or performed during the hospital encounter of 03/05/16 (from the past  24 hour(s))  Glucose, capillary Status: Abnormal   Collection Time: 03/08/16 12:59 PM  Result Value Ref Range   Glucose-Capillary 132 (H) 65 - 99 mg/dL  Glucose, capillary Status: Abnormal   Collection Time: 03/08/16 7:08 PM  Result Value Ref  Range   Glucose-Capillary 162 (H) 65 - 99 mg/dL  Glucose, capillary Status: Abnormal   Collection Time: 03/08/16 10:56 PM  Result Value Ref Range   Glucose-Capillary 124 (H) 65 - 99 mg/dL  Glucose, capillary Status: Abnormal   Collection Time: 03/09/16 12:17 AM  Result Value Ref Range   Glucose-Capillary 130 (H) 65 - 99 mg/dL  CBC Status: None   Collection Time: 03/09/16 5:32 AM  Result Value Ref Range   WBC 5.8 4.0 - 10.5 K/uL   RBC 4.54 3.87 - 5.11 MIL/uL   Hemoglobin 13.5 12.0 - 15.0 g/dL   HCT 40.9 81.1 - 91.4 %   MCV 90.5 78.0 - 100.0 fL   MCH 29.7 26.0 - 34.0 pg   MCHC 32.8 30.0 - 36.0 g/dL   RDW 78.2 95.6 - 21.3 %   Platelets 181 150 - 400 K/uL  Basic metabolic panel Status: Abnormal   Collection Time: 03/09/16 5:32 AM  Result Value Ref Range   Sodium 136 135 - 145 mmol/L   Potassium 4.2 3.5 - 5.1 mmol/L   Chloride 103 101 - 111 mmol/L   CO2 20 (L) 22 - 32 mmol/L   Glucose, Bld 134 (H) 65 - 99 mg/dL   BUN 19 6 - 20 mg/dL   Creatinine, Ser 0.86 0.44 - 1.00 mg/dL   Calcium 8.6 (L) 8.9 - 10.3 mg/dL   GFR calc non Af Amer >60 >60 mL/min   GFR calc Af Amer >60 >60 mL/min   Anion gap 13 5 - 15  Glucose, capillary Status: Abnormal   Collection Time: 03/09/16 8:19 AM  Result Value Ref Range   Glucose-Capillary 140 (H) 65 - 99 mg/dL      Imaging Results (Last 48 hours)    Dg Shoulder Left Port  03/07/2016 CLINICAL DATA: Pt had a fall from a stroke on Thursday. Pt having continued bilateral hip pain and bilateral shoulder pain, daughter reports that pt believes it might be from being transferred and moved around so much. EXAM: LEFT SHOULDER - 1 VIEW COMPARISON: None. FINDINGS: Glenohumeral joint is intact. No evidence of scapular fracture or humeral fracture. The acromioclavicular joint is intact. IMPRESSION: No fracture or  dislocation. Electronically Signed By: Genevive Bi M.D. On: 03/07/2016 13:26   Dg Shoulder Right Port  03/07/2016 CLINICAL DATA: Pt had a fall from a stroke on Thursday. Pt having continued bilateral hip pain and bilateral shoulder pain, daughter reports that pt believes it might be from being transferred and moved around so much. EXAM: PORTABLE RIGHT SHOULDER - 2+ VIEW COMPARISON: 09/27/2013 FINDINGS: Glenohumeral joint is intact. No evidence of scapular fracture or humeral fracture. The acromioclavicular joint is intact. IMPRESSION: No fracture or dislocation. Electronically Signed By: Genevive Bi M.D. On: 03/07/2016 13:30   Dg Hip Port Unilat With Pelvis 1v Left  03/07/2016 CLINICAL DATA: Fall from a stroke on Thursday EXAM: DG HIP (WITH OR WITHOUT PELVIS) 1V PORT LEFT COMPARISON: None. FINDINGS: Single view of the LEFT hip demonstrates no fracture dislocation. IMPRESSION: No fracture dislocation Electronically Signed By: Genevive Bi M.D. On: 03/07/2016 13:17   Dg Hip Port Unilat With Pelvis 1v Right  03/07/2016 CLINICAL DATA: Pt had a fall from a stroke on Thursday. Pt having continued  bilateral hip pain and bilateral shoulder pain, daughter reports that pt believes it might be from being transferred and moved around so much. EXAM: DG HIP (WITH OR WITHOUT PELVIS) 1V PORT RIGHT COMPARISON: None. FINDINGS: Hips are located. No pelvic fracture sacral fracture. Dedicated view of the RIGHT hip demonstrates no femoral neck fracture. IMPRESSION: No pelvic fracture or hip fracture. Electronically Signed By: Genevive Bi M.D. On: 03/07/2016 13:15     Assessment/Plan: Diagnosis: TBI/SDH, acute cortical infarcts posterior L-MCA, lacunar infarcts right thalamus, and Left occipital and posterior hemorraghic contusions Labs and images independently reviewed. Records reviewed and summated above. Stroke: Continue secondary stroke prophylaxis and Risk Factor  Modification listed below:  Antiplatelet therapy:  Blood Pressure Management: Continue current medication with prn's with permisive HTN per primary team Statin Agent:  Diabetes management:  Right sided hemiparesis: fit for orthotics to prevent contractures (resting hand splint for day, wrist cock up splint at night, PRAFO, etc) Motor recovery: Fluoxetine Speech to evaluate for Post traumatic amnesia and interval GOAT scores to assess progress. Address behavioral concerns include providing structured environments and daily routines. Cognitive therapy to direct modular abilities in order to maintain goals including problem solving, self regulation/monitoring, self management, attention, and memory. Fall precautions; pt at risk for second impact syndrome Prevention of secondary injury: monitor for hypotension, hypoxia, seizures or signs of increased ICP Prophylactic AED:  Avoid medications that could impair cognitive abilities, such as anticholinergics, antihistaminic, benzodiazapines, narcotics, etc when possible  1. Does the need for close, 24 hr/day medical supervision in concert with the patient's rehab needs make it unreasonable for this patient to be served in a less intensive setting? Yes  2. Co-Morbidities requiring supervision/potential complications: HTN (monitor and provide prns in accordance with increased physical exertion and pain), DM (Monitor in accordance with exercise and adjust meds as necessary), moderate pulmonary HTN (Monitor in accordance with increased physical activity and avoid UE resistance excercises), diastolic dysfunction (monitor weights), hypothyroidism (cont meds, ensure mood and energy do not limit functional progress) traumatic brain injury 3. Due to bladder management, bowel management, safety, disease management, medication administration and patient education, does the  patient require 24 hr/day rehab nursing? Yes 4. Does the patient require coordinated care of a physician, rehab nurse, PT (1-2 hrs/day, 5 days/week), OT (1-2 hrs/day, 5 days/week) and SLP (1-2 hrs/day, 5 days/week) to address physical and functional deficits in the context of the above medical diagnosis(es)? Yes Addressing deficits in the following areas: balance, endurance, locomotion, strength, transferring, bowel/bladder control, dressing, grooming, toileting, cognition and psychosocial support 5. Can the patient actively participate in an intensive therapy program of at least 3 hrs of therapy per day at least 5 days per week? Yes 6. The potential for patient to make measurable gains while on inpatient rehab is excellent 7. Anticipated functional outcomes upon discharge from inpatient rehab are min assist and mod assist with PT, min assist and mod assist with OT, supervision and min assist with SLP. 8. Estimated rehab length of stay to reach the above functional goals is: 20-24 days. 9. Does the patient have adequate social supports and living environment to accommodate these discharge functional goals? Potentially 10. Anticipated D/C setting: Home 11. Anticipated post D/C treatments: HH therapy and Home excercise program 12. Overall Rehab/Functional Prognosis: good and fair  RECOMMENDATIONS: This patient's condition is appropriate for continued rehabilitative care in the following setting: Patient's discharged disposition unclear at this time. Patient's daughter would like to think patient back to [place] and is in  the process of acquiring information. If this is not possible, it does not appear that the patient has 24/7 support at discharge in [place]. If this is the case, would recommend SNF after medical workup and plans from vascular. Patient has agreed to participate in recommended program. Potentially Note that insurance prior authorization may be required for reimbursement for  recommended care.  Comment: Rehab Admissions Coordinator to follow up.  Maryla Morrow, MD 03/09/2016       Revision History     Date/Time User Provider Type Action   03/09/2016 4:24 PM Ankit Karis Juba, MD Physician Sign   03/09/2016 12:28 PM Jacquelynn Cree, PA-C Physician Assistant Pend   View Details Report       Routing History     Date/Time From To Method   03/09/2016 4:24 PM Ankit Karis Juba, MD Willow Ora, MD Fax

## 2016-03-10 NOTE — Progress Notes (Signed)
I met with pt and her daughter, Ander Purpura, at bedside to discuss her options for rehab. Pt was independent using a cane and driving pta. Her son lives with her and is unable to provide 24/7 care for her at home. Pt and daughter questioning long term her needs in [place] vs [place]. They are going to discuss their preferences and I will return at 1330 for final dispo. I have discussed plan with SW. [phone]

## 2016-03-10 NOTE — Progress Notes (Signed)
Transferred down to [facility]. Report called prior to transport. Family at bedside.

## 2016-03-10 NOTE — Interval H&P Note (Signed)
[name] Colon was admitted today to Inpatient Rehabilitation with the diagnosis of TBI/CVA.  The patient's history has been reviewed, patient examined, and there is no change in status.  Patient continues to be appropriate for intensive inpatient rehabilitation.  I have reviewed the patient's chart and labs.  Questions were answered to the patient's satisfaction. The PAPE has been reviewed and assessment remains appropriate.  [name] Colon 03/10/2016, 5:56 PM

## 2016-03-10 NOTE — Progress Notes (Signed)
Ankit Anil Patel, MD Physician Signed Physical Medicine and Rehabilitation Consult Note 03/09/2016 11:51 AM  Related encounter: ED to Hosp-Admission (Current) from 03/05/2016 in [facility]    Expand All Collapse All        Physical Medicine and Rehabilitation Consult  Reason for Consult: Right sided weakness and difficulty speaking.  Referring Physician: Dr. Xu.    HPI: [name] Colon is a 82 y.o. female with history of HTN, DM who fell and struck her head with LOC about 10 seconds and was noted to have MS changes on 03/06/16. She was evaluated in ED and CT head negative but BP elevated with SBP > 250. She was started on IV labetalol and was found to flaccid right hemiparesis. Repeat CT head showed small acute Left cerebellar and left posterior temporal lobe SDH with moderate right scalp hematoma and moderate to severe small vessel disease. CTA head/neck showed < 50% right ICA, 80-90% L-ICA stenosis and severe centrilobar emphysema with apical bullous changes. 2D echo with EF 60-65%, moderate LVH, moderate pulmonary HTN and grade 2 diastolic dysfunction.  Bilateral hip films negative for fracture. MRI brain showed acute cortical infarcts posterior L-MCA peri-rolandic cortex superimposed small acute lacunar infarcts right thalamus and small Left occipital and posterior hemorraghic contusions. Dr. Sethi consulted and felt that patient with Left MCA and L_PCA infarcts due to rapid BP lowering in setting of severe L-ICA stenosis but A fib still in DDX. Repeat CCT stable and ASA recommended but patient has declined due to history of GI SE. VVS consulted for input on ICA stenosis. Patient's daughter at bedside, who does the majority of the talking.  Review of Systems  HENT: Positive for hearing loss.  Eyes: Negative for blurred vision and double vision.  Respiratory: Negative for cough and shortness of breath.  Cardiovascular: Negative for chest pain and  palpitations.  Gastrointestinal: Negative for heartburn, abdominal pain and diarrhea.   Incontinent of B/B  Musculoskeletal: Positive for myalgias, back pain and joint pain (right hip and right shoulder pain).  Neurological: Positive for speech change and focal weakness. Negative for headaches.  Psychiatric/Behavioral: The patient has insomnia.  All other systems reviewed and are negative.    Past Medical History  Diagnosis Date  . Hypertension   . Diabetes mellitus   . Thyroid disease   . Claudication (HCC)     Past Surgical History  Procedure Laterality Date  . Appendectomy    . Tonsillectomy    . Cataract extraction      Family History  Problem Relation Age of Onset  . Heart failure Mother   . Lung disease Father     Black Lung disease    Social History: Widowed. 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 Lives with son. Independent and driving PTA. She reports that she has never smoked. She does not have any smokeless tobacco history on file. She reports that she does not drink alcohol or use illicit drugs.    Allergies  Allergen Reactions  . Doxycycline Anaphylaxis  . Alendronate Sodium Rash  . Losartan Potassium Rash  . Sulfa Antibiotics Rash  . Aspirin Nausea And Vomiting  . Penicillins Rash    Facility-administered medications prior to admission  Medication Dose Route Frequency Provider Last Rate Last Dose  . triamcinolone acetonide (KENALOG) 10 MG/ML injection 10 mg 10 mg Other Once Norman S Regal, DPM     Medications Prior to Admission  Medication Sig Dispense Refill  .   carvedilol (COREG) 6.25 MG tablet Take 6.25 mg by mouth 2 (two) times daily.    . Cholecalciferol 2000 UNITS CAPS Take 2,000 Units by mouth daily.    . cloNIDine (CATAPRES) 0.1 MG tablet Take 0.1 mg by mouth 3 (three) times daily.  5  .  levothyroxine (SYNTHROID, LEVOTHROID) 75 MCG tablet Take 75 mcg by mouth daily before breakfast.    . loratadine (CLARITIN) 10 MG tablet Take 10 mg by mouth daily.    . primidone (MYSOLINE) 250 MG tablet Take 250 mg by mouth at bedtime.     . Naftifine HCl (NAFTIN) 2 % CREA Apply 1 drop topically 1 day or 1 dose. (Patient not taking: Reported on 03/05/2016) 60 g 2    Home: Home Living Family/patient expects to be discharged to:: Private residence Living Arrangements: Children Available Help at Discharge: (son) Type of Home: House Home Access: Stairs to enter (2 in rise) Entrance Stairs-Number of Steps: 1 Home Layout: One level Bathroom Shower/Tub: Tub/shower unit, Walk-in shower Bathroom Toilet: Standard Home Equipment: Cane - single point, Shower seat  Functional History: Prior Function Level of Independence: Independent Comments: was driving Functional Status:  Mobility: Bed Mobility Overal bed mobility: Needs Assistance, +2 for physical assistance Bed Mobility: Rolling, Sidelying to Sit Rolling: Mod assist Sidelying to sit: Mod assist, +2 for physical assistance, Max assist General bed mobility comments: max directional cues, unable to use R UE/LE functionally, limited ability to push up with L UE Transfers Overall transfer level: Needs assistance Equipment used: (2 person lift with gait belt) Transfers: Sit to/from Stand, Stand Pivot Transfers Sit to Stand: Max assist, +2 physical assistance Stand pivot transfers: Max assist, +2 physical assistance General transfer comment: max directional v/c's, unable to use R UE/LE functionally Ambulation/Gait General Gait Details: unable at this time    ADL:    Cognition: Cognition Overall Cognitive Status: Within Functional Limits for tasks assessed Arousal/Alertness: Lethargic Orientation Level: Oriented X4 Attention: Focused, Sustained Focused Attention: Impaired Focused Attention Impairment: Verbal  basic, Functional basic Sustained Attention: Impaired Sustained Attention Impairment: Verbal basic, Functional basic Memory: (will assess) Awareness: (will assess) Problem Solving: Impaired Cognition Arousal/Alertness: Awake/alert Behavior During Therapy: WFL for tasks assessed/performed Overall Cognitive Status: Within Functional Limits for tasks assessed   Blood pressure 189/49, pulse 67, temperature 98 F (36.7 C), temperature source Oral, resp. rate 18, height 5' 5" (1.651 m), weight 61.6 kg (135 lb 12.9 oz), SpO2 96 %. Physical Exam  Nursing note and vitals reviewed. Constitutional: She is oriented to person, place, and time. She appears well-developed and well-nourished.  HENT:  Head: Normocephalic and atraumatic.  Eyes: Conjunctivae are normal. Pupils are equal, round, and reactive to light.  Neck: Normal range of motion. Neck supple.  Cardiovascular: Normal rate and regular rhythm.  No murmur heard. Respiratory: Effort normal and breath sounds normal. No respiratory distress. She has no wheezes.  GI: Soft. Bowel sounds are normal. She exhibits no distension. There is no tenderness.  Musculoskeletal: She exhibits tenderness. She exhibits no edema.  Neurological: She is alert and oriented to person, place, and time.  DTRs symmetric Motor: Left upper extremity: 4/5 proximal to distal Left lower extremity: Hip flexion, knee extension 4 -/5, ankle dorsi/plantarflexion 5/5  Skin: Skin is warm and dry.  Psychiatric: She has a normal mood and affect. Her behavior is normal.     Lab Results Last 24 Hours    Results for orders placed or performed during the hospital encounter of 03/05/16 (from the past   24 hour(s))  Glucose, capillary Status: Abnormal   Collection Time: 03/08/16 12:59 PM  Result Value Ref Range   Glucose-Capillary 132 (H) 65 - 99 mg/dL  Glucose, capillary Status: Abnormal   Collection Time: 03/08/16 7:08 PM  Result Value Ref  Range   Glucose-Capillary 162 (H) 65 - 99 mg/dL  Glucose, capillary Status: Abnormal   Collection Time: 03/08/16 10:56 PM  Result Value Ref Range   Glucose-Capillary 124 (H) 65 - 99 mg/dL  Glucose, capillary Status: Abnormal   Collection Time: 03/09/16 12:17 AM  Result Value Ref Range   Glucose-Capillary 130 (H) 65 - 99 mg/dL  CBC Status: None   Collection Time: 03/09/16 5:32 AM  Result Value Ref Range   WBC 5.8 4.0 - 10.5 K/uL   RBC 4.54 3.87 - 5.11 MIL/uL   Hemoglobin 13.5 12.0 - 15.0 g/dL   HCT 41.1 36.0 - 46.0 %   MCV 90.5 78.0 - 100.0 fL   MCH 29.7 26.0 - 34.0 pg   MCHC 32.8 30.0 - 36.0 g/dL   RDW 13.6 11.5 - 15.5 %   Platelets 181 150 - 400 K/uL  Basic metabolic panel Status: Abnormal   Collection Time: 03/09/16 5:32 AM  Result Value Ref Range   Sodium 136 135 - 145 mmol/L   Potassium 4.2 3.5 - 5.1 mmol/L   Chloride 103 101 - 111 mmol/L   CO2 20 (L) 22 - 32 mmol/L   Glucose, Bld 134 (H) 65 - 99 mg/dL   BUN 19 6 - 20 mg/dL   Creatinine, Ser 0.78 0.44 - 1.00 mg/dL   Calcium 8.6 (L) 8.9 - 10.3 mg/dL   GFR calc non Af Amer >60 >60 mL/min   GFR calc Af Amer >60 >60 mL/min   Anion gap 13 5 - 15  Glucose, capillary Status: Abnormal   Collection Time: 03/09/16 8:19 AM  Result Value Ref Range   Glucose-Capillary 140 (H) 65 - 99 mg/dL      Imaging Results (Last 48 hours)    Dg Shoulder Left Port  03/07/2016 CLINICAL DATA: Pt had a fall from a stroke on Thursday. Pt having continued bilateral hip pain and bilateral shoulder pain, daughter reports that pt believes it might be from being transferred and moved around so much. EXAM: LEFT SHOULDER - 1 VIEW COMPARISON: None. FINDINGS: Glenohumeral joint is intact. No evidence of scapular fracture or humeral fracture. The acromioclavicular joint is intact. IMPRESSION: No fracture or  dislocation. Electronically Signed By: Stewart Edmunds M.D. On: 03/07/2016 13:26   Dg Shoulder Right Port  03/07/2016 CLINICAL DATA: Pt had a fall from a stroke on Thursday. Pt having continued bilateral hip pain and bilateral shoulder pain, daughter reports that pt believes it might be from being transferred and moved around so much. EXAM: PORTABLE RIGHT SHOULDER - 2+ VIEW COMPARISON: 09/27/2013 FINDINGS: Glenohumeral joint is intact. No evidence of scapular fracture or humeral fracture. The acromioclavicular joint is intact. IMPRESSION: No fracture or dislocation. Electronically Signed By: Stewart Edmunds M.D. On: 03/07/2016 13:30   Dg Hip Port Unilat With Pelvis 1v Left  03/07/2016 CLINICAL DATA: Fall from a stroke on Thursday EXAM: DG HIP (WITH OR WITHOUT PELVIS) 1V PORT LEFT COMPARISON: None. FINDINGS: Single view of the LEFT hip demonstrates no fracture dislocation. IMPRESSION: No fracture dislocation Electronically Signed By: Stewart Edmunds M.D. On: 03/07/2016 13:17   Dg Hip Port Unilat With Pelvis 1v Right  03/07/2016 CLINICAL DATA: Pt had a fall from a stroke on Thursday. Pt having continued   bilateral hip pain and bilateral shoulder pain, daughter reports that pt believes it might be from being transferred and moved around so much. EXAM: DG HIP (WITH OR WITHOUT PELVIS) 1V PORT RIGHT COMPARISON: None. FINDINGS: Hips are located. No pelvic fracture sacral fracture. Dedicated view of the RIGHT hip demonstrates no femoral neck fracture. IMPRESSION: No pelvic fracture or hip fracture. Electronically Signed By: Stewart Edmunds M.D. On: 03/07/2016 13:15     Assessment/Plan: Diagnosis: TBI/SDH, acute cortical infarcts posterior L-MCA, lacunar infarcts right thalamus, and Left occipital and posterior hemorraghic contusions Labs and images independently reviewed. Records reviewed and summated above. Stroke: Continue secondary stroke prophylaxis and Risk Factor  Modification listed below:  Antiplatelet therapy:  Blood Pressure Management: Continue current medication with prn's with permisive HTN per primary team Statin Agent:  Diabetes management:  Right sided hemiparesis: fit for orthotics to prevent contractures (resting hand splint for day, wrist cock up splint at night, PRAFO, etc) Motor recovery: Fluoxetine Speech to evaluate for Post traumatic amnesia and interval GOAT scores to assess progress. Address behavioral concerns include providing structured environments and daily routines. Cognitive therapy to direct modular abilities in order to maintain goals including problem solving, self regulation/monitoring, self management, attention, and memory. Fall precautions; pt at risk for second impact syndrome Prevention of secondary injury: monitor for hypotension, hypoxia, seizures or signs of increased ICP Prophylactic AED:  Avoid medications that could impair cognitive abilities, such as anticholinergics, antihistaminic, benzodiazapines, narcotics, etc when possible  1. Does the need for close, 24 hr/day medical supervision in concert with the patient's rehab needs make it unreasonable for this patient to be served in a less intensive setting? Yes  2. Co-Morbidities requiring supervision/potential complications: HTN (monitor and provide prns in accordance with increased physical exertion and pain), DM (Monitor in accordance with exercise and adjust meds as necessary), moderate pulmonary HTN (Monitor in accordance with increased physical activity and avoid UE resistance excercises), diastolic dysfunction (monitor weights), hypothyroidism (cont meds, ensure mood and energy do not limit functional progress) traumatic brain injury 3. Due to bladder management, bowel management, safety, disease management, medication administration and patient education, does the  patient require 24 hr/day rehab nursing? Yes 4. Does the patient require coordinated care of a physician, rehab nurse, PT (1-2 hrs/day, 5 days/week), OT (1-2 hrs/day, 5 days/week) and SLP (1-2 hrs/day, 5 days/week) to address physical and functional deficits in the context of the above medical diagnosis(es)? Yes Addressing deficits in the following areas: balance, endurance, locomotion, strength, transferring, bowel/bladder control, dressing, grooming, toileting, cognition and psychosocial support 5. Can the patient actively participate in an intensive therapy program of at least 3 hrs of therapy per day at least 5 days per week? Yes 6. The potential for patient to make measurable gains while on inpatient rehab is excellent 7. Anticipated functional outcomes upon discharge from inpatient rehab are min assist and mod assist with PT, min assist and mod assist with OT, supervision and min assist with SLP. 8. Estimated rehab length of stay to reach the above functional goals is: 20-24 days. 9. Does the patient have adequate social supports and living environment to accommodate these discharge functional goals? Potentially 10. Anticipated D/C setting: Home 11. Anticipated post D/C treatments: HH therapy and Home excercise program 12. Overall Rehab/Functional Prognosis: good and fair  RECOMMENDATIONS: This patient's condition is appropriate for continued rehabilitative care in the following setting: Patient's discharged disposition unclear at this time. Patient's daughter would like to think patient back to [place] and is in   the process of acquiring information. If this is not possible, it does not appear that the patient has 24/7 support at discharge in [place]. If this is the case, would recommend SNF after medical workup and plans from vascular. Patient has agreed to participate in recommended program. Potentially Note that insurance prior authorization may be required for reimbursement for  recommended care.  Comment: Rehab Admissions Coordinator to follow up.  Ankit Patel, MD 03/09/2016       Revision History     Date/Time User Provider Type Action   03/09/2016 4:24 PM Ankit Anil Patel, MD Physician Sign   03/09/2016 12:28 PM Pamela S Love, PA-C Physician Assistant Pend   View Details Report       Routing History     Date/Time From To Method   03/09/2016 4:24 PM Ankit Anil Patel, MD Camille L Andy, MD Fax           

## 2016-03-10 NOTE — Progress Notes (Signed)
STROKE TEAM PROGRESS NOTE   SUBJECTIVE (INTERVAL HISTORY) Daughter at bedside. Met with rehab this am to discuss options. They are considering their options.    OBJECTIVE Temp:  [97.5 F (36.4 C)-98.1 F (36.7 C)] 97.7 F (36.5 C) (04/25 0445) Pulse Rate:  [57-72] 64 (04/25 0654) Cardiac Rhythm:  [-] Normal sinus rhythm (04/25 0700) Resp:  [16-19] 18 (04/25 0445) BP: (156-201)/(47-77) 201/64 mmHg (04/25 0654) SpO2:  [92 %-99 %] 92 % (04/25 0445)  CBC:  Recent Labs Lab 03/06/16 0032  03/09/16 0532 03/10/16 0736  WBC 7.2  < > 5.8 4.5  NEUTROABS 5.6  --   --   --   HGB 14.0  < > 13.5 13.4  HCT 41.3  < > 41.1 41.4  MCV 89.4  < > 90.5 90.0  PLT 179  < > 181 175  < > = values in this interval not displayed.  Basic Metabolic Panel:   Recent Labs Lab 03/09/16 0532 03/10/16 0736  NA 136 135  K 4.2 3.7  CL 103 104  CO2 20* 19*  GLUCOSE 134* 149*  BUN 19 15  CREATININE 0.78 0.72  CALCIUM 8.6* 8.7*    Lipid Panel:     Component Value Date/Time   CHOL 191 03/07/2016 0215   TRIG 84 03/07/2016 0215   HDL 46 03/07/2016 0215   CHOLHDL 4.2 03/07/2016 0215   VLDL 17 03/07/2016 0215   LDLCALC 128* 03/07/2016 0215   HgbA1c:  Lab Results  Component Value Date   HGBA1C 7.5* 03/07/2016   Urine Drug Screen:     Component Value Date/Time   LABOPIA NONE DETECTED 03/06/2016 0038   COCAINSCRNUR NONE DETECTED 03/06/2016 0038   LABBENZ NONE DETECTED 03/06/2016 0038   AMPHETMU NONE DETECTED 03/06/2016 0038   THCU NONE DETECTED 03/06/2016 0038   LABBARB POSITIVE* 03/06/2016 0038      IMAGING  Ct Angio Head W/cm &/or Wo Cm 03/06/2016    CT HEAD: Small acute subdural hematoma along LEFT cerebellar tentorium and LEFT posterior temporal lobe.  Moderate RIGHT scalp hematoma.  No skull fracture. Stable involutional changes and moderate to severe chronic small vessel ischemic disease.   CTA NECK: 80- 90% focal stenosis LEFT internal carotid artery origin. Less than 50%  stenosis RIGHT internal carotid artery origin.   CTA HEAD:  No emergent large vessel occlusion. Moderate to severe stenosis bilateral cavernous internal carotid arteries due to calcific atherosclerosis.   Ct Head Wo Contrast 03/10/2016   Acute nonhemorrhagic small to moderate-size posterior left frontal-parietal infarct. No CT evidence of hemorrhagic conversion.   MR detected tiny left occipital lobe, right thalamic and anterior left cerebellar infarcts better delineated on recent MR.  Small subdural hematoma left occipital lobe extending along the left tentorium without change.  Small posterior lateral left temporal lobe hemorrhagic contusion measures up to 1.2 cm without significant change. Slight increased in amount of adjacent parenchymal edema. Small vessel disease and global atrophy stable.  03/06/2016   Areas of subdural hematoma, stable from earlier in the day. No new foci of hemorrhage. No significant mass effect and no midline shift apparent. Atrophy with small vessel disease is stable. No acute infarct evident.   03/05/2016   No acute intracranial abnormalities. Chronic atrophy and small vessel ischemic changes.  Degenerative changes in the cervical spine.  Normal alignment.  Mr Brain Wo Contrast 03/06/2016   1. Acute cortically based infarcts in the posterior left MCA territory affecting the peri-Rolandic cortex. Gyral edema without associated mass  effect or parenchymal hemorrhage here.  2. Small left subdural hematoma most apparent along the medial left occipital lobe. Superimposed small left posterior temporal lobe hemorrhagic contusion is favored over additional subdural blood.  3. Superimposed small acute lacunar infarct in the lateral right thalamus (right thalamus striate/PCA territory). And there might also be a small left superior cerebellar artery territory infarct versus artifact from adjacent blood products. Favor synchronous small vessel disease for these rather than  sequelae of recent embolic event.   Dg Knee Complete 4 Views Left 03/05/2016   Sclerosis in the distal left femur consistent with enchondroma or bone infarct. No acute fracture or dislocation.   Dg Shoulder Left Port 03/07/2016  IMPRESSION: No fracture or dislocation.    Dg Shoulder Right Port 03/07/2016  IMPRESSION: No fracture or dislocation.  Dg Hip Port Unilat With Pelvis 1v Left 03/07/2016  IMPRESSION: No fracture dislocation  Dg Hip Port Unilat With Pelvis 1v Right 03/07/2016  IMPRESSION: No pelvic fracture or hip fracture.    LE venous doppler - No evidence of DVT or baker's cyst.  2D echo  - Left ventricle: The cavity size was normal. There was moderate concentric hypertrophy. Systolic function was normal. The estimated ejection fraction was in the range of 60% to 65%. Wall motion was normal; there were no regional wall motion abnormalities. Features are consistent with a pseudonormal left ventricular filling pattern, with concomitant abnormal relaxation and increased filling pressure (grade 2 diastolic dysfunction). Doppler parameters are consistent with high ventricular filling  pressure. - Aortic valve: Moderately to severely calcified annulus. Severe diffuse thickening and calcification. Valve mobility was restricted. There was mild stenosis. There was trivial regurgitation. Valve area (VTI): 1.99 cm^2. Valve area (Vmax): 1.82 cm^2. Valve area (Vmean): 1.69 cm^2. - Mitral valve: Calcified annulus. Moderate diffuse thickening and calcification. Valve area by pressure half-time: 2.39 cm^2. - Left atrium: The atrium was severely dilated. - Atrial septum: No defect or patent foramen ovale was identified. - Pulmonary arteries: PA peak pressure: 67 mm Hg (S). Impressions:   The right ventricular systolic pressure was increased consistent with moderate pulmonary hypertension.  CUS - 1-39% right ICA plaquing. 40-59% left ICA stenosis, highest end of scale, however, based on plaque  formation and ICA/CCA ratio, cannot rule out a higher grade stenosis. Vertebral artery flow is antegrade.    PHYSICAL EXAM Temp:  [97.5 F (36.4 C)-98.1 F (36.7 C)] 97.7 F (36.5 C) (04/25 0445) Pulse Rate:  [57-72] 64 (04/25 0654) Resp:  [16-19] 18 (04/25 0445) BP: (156-201)/(47-77) 201/64 mmHg (04/25 0654) SpO2:  [92 %-99 %] 92 % (04/25 0445)  General - Well nourished, well developed, not in acute distress.  Ophthalmologic - Fundi not visualized due to noncooperation.  Cardiovascular - Regular rate and rhythm.  Mental Status -  Awake, alert, orientated to people and self, but not orientated to place, time or age. Language including expression, naming 2/2, repetition, comprehension was assessed and found intact.  Cranial Nerves II - XII - II - Visual field intact OU. III, IV, VI - Extraocular movements intact. V - Facial sensation intact bilaterally. VII - Facial movement intact bilaterally. VIII - Hearing & vestibular intact bilaterally. X - Palate elevates symmetrically. XI - Chin turning & shoulder shrug intact bilaterally. XII - Tongue protrusion intact.  Motor Strength - The patient's strength was 0/5 RUE and 2/5 RLE in the setting of right shoulder and hip pain on moving, 5/5 LUE and 4+/5 LLE due to left hip pain.  Bulk was  normal and fasciculations were absent.   Motor Tone - Muscle tone was assessed at the neck and appendages and was decreased at RUE.  Reflexes - The patient's reflexes were 1+ in all extremities and she had no pathological reflexes.  Sensory - symmetrical bilaterally but pain on b/l hip and right shoulder.    Coordination - no ataxia or dysmetria on the left hand.  Tremor was absent.  Gait and Station - not tested due to safety concerns.   ASSESSMENT/PLAN Ms. CADANCE RAUS is a 82 y.o. female with history of hypertension, diabetes mellitus, thyroid disease, and claudication presenting with a recent fall with head injury. While in ED,  developed right hemiplegia. CT showed small left tentorial SDH. She did not receive IV t-PA due to a subdural hematoma.  Stroke: Left MCA territory infarcts, but also left MCA/PCA, right thalamus and left SCA small infarcts, etiology not clear, but may be due to rapid BP lowering in the setting of high grade stenosis of left ICA and multifocal moderate to severe atherosclerosis. Afib still in DDx  Resultant  right hemiplegia  MRI - Acute cortically based infarcts in the posterior left MCA territory affecting the peri-Rolandic cortex, as well as punctate infarcts in left MCA/PCA, right thalamus and left SCA..  CTA of the neck - 80- 90% focal stenosis LEFT ICA origin, and moderate to severe stenosis bilateral cavernous ICA due to calcific atherosclerosis.  CUS - 40-59% left ICA stenosis, highest end of scale, however, based on plaque formation and ICA/CCA ratio, cannot rule out a higher grade stenosis.    2D Echo - EF 60-65%  Bilateral lower extremity venous duplex - negative for DVT  Repeat CT this am stable, no increase in size  Recommend outpt 30 day cardiac event monitoring to rule out afib  LDL - 128  HgbA1c 7.5  VTE prophylaxis - SCDs Diet regular Room service appropriate?: Yes; Fluid consistency:: Thin  No antithrombotic prior to admission. Now on aspirin suppository 300 mg daily (Pt refused to take ASA po as ASA causing her upset stomach). will change to plavix at discharge  Ongoing aggressive stroke risk factor management  Therapy recommendations: SNF vs CIR - they are meeting with her today for final decision  Disposition: Pending (Lives with son)  Small left temporoparietal SDH - traumatic  Due to fall  Stable on repeat CT  Ok to start ASA for stroke prevention  Repeat CT stable - will change to plavix at discharge  Carotid stenosis, left  CTA showed left ICA 80-90%  CUS showed 40-59% left ICA stenosis, highest end of scale, however, based on plaque  formation and ICA/CCA ratio, cannot rule out a higher grade stenosis.  Pt has left MCA infarct, likely due to rapid lowering BP  vascular surgery consulted. plan outpatient follow-up in 4-6 weeks.  No CEA this time (functional status 4 with complete hemiplegia, 82 years old, subdural hematoma, which may limit use of antiplatelets/anticoagulants)  Hypertension  On admission BP as high as 269/97, and down to 144/47 in 3 hours  Remains elevated, 170s-200s this am BP goal 130-160, avoid hypotension On coreg and clonidine 0.2 tid  Hyperlipidemia  Home meds: No lipid lowering medications prior to admission  LDL 128, goal < 70  Add Lipitor 40 mg daily  Continue statin at discharge  Diabetes  HgbA1c 7.5, goal < 7.0  SSI  CBG monitoring  Close follow up with PCP  Other Stroke Risk Factors  Advanced age  Other Active  Problems  Scalp laceration  Hospital day # 4  Radene Journey [facility] [place] for Pager information 03/10/2016 12:32 PM   Pt doing well, still has right hemiplegia. BP still high and increase clonidine dose. Working with PT/OT. Plan of SNF today. Follow up in clinic. BP goal 130-160.   Rosalin Hawking, MD PhD Stroke Neurology 03/11/2016 6:39 AM    To contact Stroke Continuity provider, please refer to [email or web address] After hours, contact General [email or web address]

## 2016-03-10 NOTE — Progress Notes (Signed)
Patient ID: [name] DrapeElizabeth M Colon, female   DOB: 06/26/1920, 82 y.o.   MRN: [id number] Patient admitted to 4W20 via bed, escorted by nursing staff and daughter.  Patient and daughter verbalized understanding of rehab process, signed fall safety agreement.  Appears to be in no immediate distress at this time.  Dani Gobbleeardon, Oris Staffieri J, RN

## 2016-03-10 NOTE — H&P (Signed)
Physical Medicine and Rehabilitation Admission H&P    Chief Complaint  Patient presents with  .    HPI:   [name] Colon is a 82 y.o. female with history of HTN, DM who fell and struck her head with LOC about 10 seconds and was noted to have MS changes on 03/05/16. She was evaluated in ED and CT head negative but BP elevated with SBP > 250. She was started on IV labetalol and was found to flaccid right hemiparesis. Repeat CT head showed small acute Left cerebellar and left posterior temporal lobe SDH with moderate right scalp hematoma and moderate to severe small vessel disease. CTA head/neck showed < 50% right ICA, 80-90% L-ICA stenosis and severe centrilobar emphysema with apical bullous changes. 2D echo with EF 60-65%, moderate LVH, moderate pulmonary HTN and grade 2 diastolic dysfunction.  Bilateral hip films negative for fracture. MRI brain showed acute cortical infarcts posterior L-MCA peri-rolandic cortex superimposed small acute lacunar infarcts right thalamus and small Left occipital and posterior hemorraghic contusions. Dr. Leonie Man consulted and felt that patient with Left MCA and L-PCA infarcts due to rapid BP lowering in setting of severe L-ICA stenosis but A fib still in DDX.   Follow up CCT 4/21was stable and ASA recommended but patient has declined this due to history of GI SE. Changed to plavix today. Repeat CT head today with acute non-hemorrhagic small to moderate posterior left frontal - parietal infarct and no evidence of hemorrhagic conversion and small SDH  left occipital lobe without change. VVS consulted for input on ICA stenosis and patient to follow up in office. Patient with resultant dense right hemiplegia and dizziness affecting mobility and ability to carry out ADLs.  Therapy ongoing and CIR recommended for follow up therapy.    Review of Systems  HENT: Positive for hearing loss.   Eyes: Negative for blurred vision and double vision.  Respiratory: Negative for  cough and shortness of breath.   Cardiovascular: Negative for chest pain and palpitations.  Gastrointestinal: Negative for heartburn, abdominal pain and constipation.  Genitourinary: Negative for dysuria and frequency.  Musculoskeletal: Positive for myalgias and joint pain.  Skin: Negative for rash.  Neurological: Positive for tingling, sensory change, focal weakness and weakness. Negative for dizziness and headaches.  Psychiatric/Behavioral: Positive for depression. The patient has insomnia (hard to sleep in the hospital ).       Past Medical History  Diagnosis Date  . Hypertension   . Diabetes mellitus   . Thyroid disease   . Claudication [facility])     Past Surgical History  Procedure Laterality Date  . Appendectomy    . Tonsillectomy    . Cataract extraction      Family History  Problem Relation Age of Onset  . Heart failure Mother   . Lung disease Father     Black Lung disease    Social History:  Widowed. 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 Lives with son who works. She was independent and driving PTA. Son helps with housework and she eats out frequently. Plans on d/c to daughter in [place]?  She smoked for 20+ years and was exposed to second hand smoke (husbandP. She does not have any smokeless tobacco history on file. She reports that she does not drink alcohol or use illicit drugs.    Allergies  Allergen Reactions  . Doxycycline Anaphylaxis  . Alendronate Sodium Rash  . Losartan Potassium Rash  . Sulfa Antibiotics Rash  .  Aspirin Nausea And Vomiting  . Penicillins Rash    Facility-administered medications prior to admission  Medication Dose Route Frequency Provider Last Rate Last Dose  . triamcinolone acetonide (KENALOG) 10 MG/ML injection 10 mg  10 mg Other Once Wallene Huh, DPM       Medications Prior to Admission  Medication Sig Dispense Refill  . carvedilol (COREG) 6.25 MG tablet Take 6.25 mg by mouth 2 (two) times daily.      . Cholecalciferol 2000 UNITS CAPS Take 2,000 Units by mouth daily.    . cloNIDine (CATAPRES) 0.1 MG tablet Take 0.1 mg by mouth 3 (three) times daily.  5  . levothyroxine (SYNTHROID, LEVOTHROID) 75 MCG tablet Take 75 mcg by mouth daily before breakfast.    . loratadine (CLARITIN) 10 MG tablet Take 10 mg by mouth daily.    . primidone (MYSOLINE) 250 MG tablet Take 250 mg by mouth at bedtime.     . Naftifine HCl (NAFTIN) 2 % CREA Apply 1 drop topically 1 day or 1 dose. (Patient not taking: Reported on 03/05/2016) 60 g 2    Home: Home Living Family/patient expects to be discharged to:: Unsure Living Arrangements: Children Available Help at Discharge: Family (son) Type of Home: House Home Access: Stairs to enter (2 in rise) Technical brewer of Steps: 1 Home Layout: One level Bathroom Shower/Tub: Public librarian, Multimedia programmer: Standard Home Equipment: Radio producer - single point, Industrial/product designer History: Prior Function Level of Independence: Independent Comments: was driving  Functional Status:  Mobility: Bed Mobility Overal bed mobility: Needs Assistance Bed Mobility: Rolling, Sidelying to Sit Rolling: Max assist Sidelying to sit: Mod assist, Max assist, +2 for physical assistance Supine to sit: Max assist Sit to supine: Max assist General bed mobility comments: max A for trunk elevation and max directional v/c's, attempted to teach pt how to hook L LE under R LE to move off bed however unsuccesful Transfers Overall transfer level: Needs assistance Equipment used:  (2 person lift with gait belt) Transfers: Sit to/from Stand, Stand Pivot Transfers Sit to Stand: Max assist, +2 physical assistance Stand pivot transfers: Max assist, +2 physical assistance General transfer comment: completed 3 std pvts to chair and BSC, pt assisted more with L UE and LE this date but unable to step and R side remains flaccid.  Ambulation/Gait General Gait Details: unable     ADL: ADL Overall ADL's : Needs assistance/impaired Eating/Feeding: Set up, Supervision/ safety, Bed level Upper Body Dressing : Maximal assistance, Bed level Lower Body Dressing: Total assistance, Bed level General ADL Comments: sat EOB in session-became dizzy. Discussed positioning of Rt UE and positioning it on pillows at end of session.   Cognition: Cognition Overall Cognitive Status: Within Functional Limits for tasks assessed Arousal/Alertness: Lethargic Orientation Level: Oriented X4 Attention: Focused, Sustained Focused Attention: Impaired Focused Attention Impairment: Verbal basic, Functional basic Sustained Attention: Impaired Sustained Attention Impairment: Verbal basic, Functional basic Memory:  (will assess) Awareness:  (will assess) Problem Solving: Impaired Cognition Arousal/Alertness: Awake/alert Behavior During Therapy: WFL for tasks assessed/performed Overall Cognitive Status: Within Functional Limits for tasks assessed   Blood pressure 135/46, pulse 65, temperature 97.9 F (36.6 C), temperature source Oral, resp. rate 19, height 5' 5" (1.651 m), weight 61.6 kg (135 lb 12.9 oz), SpO2 100 %. Physical Exam  Nursing note and vitals reviewed. Constitutional: She is oriented to person, place, and time. She appears well-developed and well-nourished.  HENT:  Head: Normocephalic.  Mouth/Throat: Oropharyngeal exudate present.  Eyes: Conjunctivae are normal. Pupils are equal, round, and reactive to light.  Neck: Normal range of motion. Neck supple.  Cardiovascular: Normal rate and regular rhythm.   Murmur heard. Respiratory: Effort normal and breath sounds normal. No respiratory distress. She exhibits no tenderness.  GI: Soft. Bowel sounds are normal. She exhibits no distension. There is no tenderness.  Musculoskeletal: She exhibits edema and tenderness (Right hip with attempts at ROM).  Right heel with blister medially with central dark area.  Callus left heel  medially. Right shoulder very tender with palpation and PROM.   Neurological: She is alert and oriented to person, place, and time. A cranial nerve deficit is present.  Soft voice with delayed verbal output. Does have fair insight and awareness. HOH?. Right inattention with right field cut. Right central 7.  Dense right hemiparesis 0/5 in the right upper extremity to lower extremity proximal to distal. Right hamstring with early tone while in supine.   Skin: Skin is warm and dry.  Psychiatric: Her speech is delayed. She is slowed and withdrawn. She is inattentive.    Results for orders placed or performed during the hospital encounter of 03/05/16 (from the past 48 hour(s))  Glucose, capillary     Status: Abnormal   Collection Time: 03/08/16  7:08 PM  Result Value Ref Range   Glucose-Capillary 162 (H) 65 - 99 mg/dL  Glucose, capillary     Status: Abnormal   Collection Time: 03/08/16 10:56 PM  Result Value Ref Range   Glucose-Capillary 124 (H) 65 - 99 mg/dL  Glucose, capillary     Status: Abnormal   Collection Time: 03/09/16 12:17 AM  Result Value Ref Range   Glucose-Capillary 130 (H) 65 - 99 mg/dL  CBC     Status: None   Collection Time: 03/09/16  5:32 AM  Result Value Ref Range   WBC 5.8 4.0 - 10.5 K/uL   RBC 4.54 3.87 - 5.11 MIL/uL   Hemoglobin 13.5 12.0 - 15.0 g/dL   HCT 41.1 36.0 - 46.0 %   MCV 90.5 78.0 - 100.0 fL   MCH 29.7 26.0 - 34.0 pg   MCHC 32.8 30.0 - 36.0 g/dL   RDW 13.6 11.5 - 15.5 %   Platelets 181 150 - 400 K/uL  Basic metabolic panel     Status: Abnormal   Collection Time: 03/09/16  5:32 AM  Result Value Ref Range   Sodium 136 135 - 145 mmol/L   Potassium 4.2 3.5 - 5.1 mmol/L   Chloride 103 101 - 111 mmol/L   CO2 20 (L) 22 - 32 mmol/L   Glucose, Bld 134 (H) 65 - 99 mg/dL   BUN 19 6 - 20 mg/dL   Creatinine, Ser 0.78 0.44 - 1.00 mg/dL   Calcium 8.6 (L) 8.9 - 10.3 mg/dL   GFR calc non Af Amer >60 >60 mL/min   GFR calc Af Amer >60 >60 mL/min    Comment:  (NOTE) The eGFR has been calculated using the CKD EPI equation. This calculation has not been validated in all clinical situations. eGFR's persistently <60 mL/min signify possible Chronic Kidney Disease.    Anion gap 13 5 - 15  Glucose, capillary     Status: Abnormal   Collection Time: 03/09/16  8:19 AM  Result Value Ref Range   Glucose-Capillary 140 (H) 65 - 99 mg/dL  Glucose, capillary     Status: Abnormal   Collection Time: 03/09/16 12:13 PM  Result Value Ref Range   Glucose-Capillary 212 (  H) 65 - 99 mg/dL  Glucose, capillary     Status: Abnormal   Collection Time: 03/09/16  5:16 PM  Result Value Ref Range   Glucose-Capillary 160 (H) 65 - 99 mg/dL  Glucose, capillary     Status: Abnormal   Collection Time: 03/09/16  7:48 PM  Result Value Ref Range   Glucose-Capillary 108 (H) 65 - 99 mg/dL  Glucose, capillary     Status: Abnormal   Collection Time: 03/09/16 11:57 PM  Result Value Ref Range   Glucose-Capillary 133 (H) 65 - 99 mg/dL  CBC     Status: None   Collection Time: 03/10/16  7:36 AM  Result Value Ref Range   WBC 4.5 4.0 - 10.5 K/uL   RBC 4.60 3.87 - 5.11 MIL/uL   Hemoglobin 13.4 12.0 - 15.0 g/dL   HCT 41.4 36.0 - 46.0 %   MCV 90.0 78.0 - 100.0 fL   MCH 29.1 26.0 - 34.0 pg   MCHC 32.4 30.0 - 36.0 g/dL   RDW 13.4 11.5 - 15.5 %   Platelets 175 150 - 400 K/uL  Basic metabolic panel     Status: Abnormal   Collection Time: 03/10/16  7:36 AM  Result Value Ref Range   Sodium 135 135 - 145 mmol/L   Potassium 3.7 3.5 - 5.1 mmol/L   Chloride 104 101 - 111 mmol/L   CO2 19 (L) 22 - 32 mmol/L   Glucose, Bld 149 (H) 65 - 99 mg/dL   BUN 15 6 - 20 mg/dL   Creatinine, Ser 0.72 0.44 - 1.00 mg/dL   Calcium 8.7 (L) 8.9 - 10.3 mg/dL   GFR calc non Af Amer >60 >60 mL/min   GFR calc Af Amer >60 >60 mL/min    Comment: (NOTE) The eGFR has been calculated using the CKD EPI equation. This calculation has not been validated in all clinical situations. eGFR's persistently <60 mL/min  signify possible Chronic Kidney Disease.    Anion gap 12 5 - 15  Glucose, capillary     Status: Abnormal   Collection Time: 03/10/16  7:55 AM  Result Value Ref Range   Glucose-Capillary 131 (H) 65 - 99 mg/dL  Glucose, capillary     Status: Abnormal   Collection Time: 03/10/16 12:09 PM  Result Value Ref Range   Glucose-Capillary 183 (H) 65 - 99 mg/dL   Ct Head Wo Contrast  03/10/2016  CLINICAL DATA:  82 year old hypertensive diabetic female post fall with right-sided weakness. Subsequent encounter. EXAM: CT HEAD WITHOUT CONTRAST TECHNIQUE: Contiguous axial images were obtained from the base of the skull through the vertex without intravenous contrast. COMPARISON:  03/06/2016 CT and MR. FINDINGS: Acute nonhemorrhagic small to moderate-size posterior left frontal-parietal infarct without CT evidence of hemorrhagic conversion. MR detected tiny left occipital lobe, right thalamic and anterior left cerebellar infarcts better delineated on recent MR. Prominent chronic micro vascular white matter changes. Small subdural hematoma left occipital lobe extending along the left tentorium without change. Small posterior lateral left temporal lobe hemorrhagic contusion measures up to 1.2 cm without significant change. Slight increased in amount of adjacent parenchymal edema. Global atrophy. Ventricular prominence probably related to atrophy rather than hydrocephalus and without change. Post lens replacement.  Exophthalmos. No skull fracture detected. Lucency right parietal calvarium unchanged. Mastoid air cells, middle ear cavities and visualized paranasal sinuses are clear. IMPRESSION: Acute nonhemorrhagic small to moderate-size posterior left frontal-parietal infarct. No CT evidence of hemorrhagic conversion. MR detected tiny left occipital lobe, right thalamic  and anterior left cerebellar infarcts better delineated on recent MR. Small subdural hematoma left occipital lobe extending along the left tentorium without  change. Small posterior lateral left temporal lobe hemorrhagic contusion measures up to 1.2 cm without significant change. Slight increased in amount of adjacent parenchymal edema. Small vessel disease and global atrophy stable. Electronically Signed   By: Genia Del M.D.   On: 03/10/2016 05:58       Medical Problem List and Plan: 1.  Right hemiparesis and cognitive deficits secondary to TBI and L-MCA infarcts.  2.  DVT Prophylaxis/Anticoagulation: Pharmaceutical: Lovenox 3. Pain Management: Tylenol prn  -right shoulder pain. Suspicious for injury related to fall  -will check xrays of right shoulder   -support RUE/shoulder as possible 4. Mood: Team to provide ego support.  LCSW to follow for evaluation and support.  5. Neuropsych: This patient is not fully  capable of making decisions on her own behalf. 6. Skin/Wound Care: Routine pressure relief measures.  Maintain adequate nutrition and hydration status. Will order prevaon boots.  7. Fluids/Electrolytes/Nutrition:  Monitor I/O. Check lytes in am.  8. HTN: Monitor BP tid - continue coreg bid and Catapres tid. 9. DM type 2: Hgb A1c- 7.5. Check blood sugars ac/hs. Use SSI for elevated BS--will likely need oral agent as BS variable. Marland Kitchen       Post Admission Physician Evaluation: 1. Functional deficits secondary  to TBI, and left mca infarct. 2. Patient is admitted to receive collaborative, interdisciplinary care between the physiatrist, rehab nursing staff, and therapy team. 3. Patient's level of medical complexity and substantial therapy needs in context of that medical necessity cannot be provided at a lesser intensity of care such as a SNF. 4. Patient has experienced substantial functional loss from his/her baseline which was documented above under the "Functional History" and "Functional Status" headings.  Judging by the patient's diagnosis, physical exam, and functional history, the patient has potential for functional progress which  will result in measurable gains while on inpatient rehab.  These gains will be of substantial and practical use upon discharge  in facilitating mobility and self-care at the household level. 5. Physiatrist will provide 24 hour management of medical needs as well as oversight of the therapy plan/treatment and provide guidance as appropriate regarding the interaction of the two. 6. 24 hour rehab nursing will assist with bladder management, bowel management, safety, skin/wound care, disease management, medication administration, pain management and patient education  and help integrate therapy concepts, techniques,education, etc. 7. PT will assess and treat for/with: Lower extremity strength, range of motion, stamina, balance, functional mobility, safety, adaptive techniques and equipment, NMR, pain mgt, splinting, w/c asessment, family ed, ego support.   Goals are:  mod assist. 8. OT will assess and treat for/with: ADL's, functional mobility, safety, upper extremity strength, adaptive techniques and equipment, NMR, splinting, pain control, ego support, family ed.   Goals are: mod to max assist. Therapy may not yet proceed with showering this patient. 9. SLP will assess and treat for/with: cognition, language, communication, swallowing, education.  Goals are: supervision to min assist. 10. Case Management and Social Worker will assess and treat for psychological issues and discharge planning. 11. Team conference will be held weekly to assess progress toward goals and to determine barriers to discharge. 12. Patient will receive at least 3 hours of therapy per day at least 5 days per week. 13. ELOS: 20-25 days       14. Prognosis:  excellent     Alroy Dust T.  Naaman Plummer, MD, [facility] 03/10/2016   03/10/2016

## 2016-03-11 ENCOUNTER — Inpatient Hospital Stay (HOSPITAL_COMMUNITY): Payer: Medicare Other | Admitting: Occupational Therapy

## 2016-03-11 ENCOUNTER — Inpatient Hospital Stay (HOSPITAL_COMMUNITY): Payer: Medicare Other | Admitting: Physical Therapy

## 2016-03-11 ENCOUNTER — Inpatient Hospital Stay (HOSPITAL_COMMUNITY): Payer: Medicare Other | Admitting: Speech Pathology

## 2016-03-11 DIAGNOSIS — G8191 Hemiplegia, unspecified affecting right dominant side: Secondary | ICD-10-CM

## 2016-03-11 DIAGNOSIS — M75101 Unspecified rotator cuff tear or rupture of right shoulder, not specified as traumatic: Secondary | ICD-10-CM

## 2016-03-11 DIAGNOSIS — I6330 Cerebral infarction due to thrombosis of unspecified cerebral artery: Secondary | ICD-10-CM

## 2016-03-11 DIAGNOSIS — S069X0S Unspecified intracranial injury without loss of consciousness, sequela: Secondary | ICD-10-CM

## 2016-03-11 DIAGNOSIS — F068 Other specified mental disorders due to known physiological condition: Secondary | ICD-10-CM

## 2016-03-11 DIAGNOSIS — I10 Essential (primary) hypertension: Secondary | ICD-10-CM

## 2016-03-11 DIAGNOSIS — S069X2S Unspecified intracranial injury with loss of consciousness of 31 minutes to 59 minutes, sequela: Secondary | ICD-10-CM

## 2016-03-11 LAB — CBC WITH DIFFERENTIAL/PLATELET
Basophils Absolute: 0 10*3/uL (ref 0.0–0.1)
Basophils Relative: 1 %
Eosinophils Absolute: 0.2 10*3/uL (ref 0.0–0.7)
Eosinophils Relative: 3 %
HEMATOCRIT: 39.8 % (ref 36.0–46.0)
HEMOGLOBIN: 13.2 g/dL (ref 12.0–15.0)
LYMPHS ABS: 1.9 10*3/uL (ref 0.7–4.0)
Lymphocytes Relative: 37 %
MCH: 29.7 pg (ref 26.0–34.0)
MCHC: 33.2 g/dL (ref 30.0–36.0)
MCV: 89.6 fL (ref 78.0–100.0)
MONOS PCT: 18 %
Monocytes Absolute: 0.9 10*3/uL (ref 0.1–1.0)
NEUTROS ABS: 2.1 10*3/uL (ref 1.7–7.7)
NEUTROS PCT: 41 %
Platelets: 201 10*3/uL (ref 150–400)
RBC: 4.44 MIL/uL (ref 3.87–5.11)
RDW: 13.3 % (ref 11.5–15.5)
WBC: 5.1 10*3/uL (ref 4.0–10.5)

## 2016-03-11 LAB — COMPREHENSIVE METABOLIC PANEL
ALK PHOS: 61 U/L (ref 38–126)
ALT: 21 U/L (ref 14–54)
ANION GAP: 8 (ref 5–15)
AST: 24 U/L (ref 15–41)
Albumin: 2.7 g/dL — ABNORMAL LOW (ref 3.5–5.0)
BILIRUBIN TOTAL: 0.4 mg/dL (ref 0.3–1.2)
BUN: 17 mg/dL (ref 6–20)
CALCIUM: 8.6 mg/dL — AB (ref 8.9–10.3)
CO2: 22 mmol/L (ref 22–32)
Chloride: 107 mmol/L (ref 101–111)
Creatinine, Ser: 0.8 mg/dL (ref 0.44–1.00)
GFR calc non Af Amer: >60 mL/min (ref >60)
GLUCOSE: 157 mg/dL — AB (ref 65–99)
Potassium: 4.2 mmol/L (ref 3.5–5.1)
Sodium: 137 mmol/L (ref 135–145)
TOTAL PROTEIN: 6.2 g/dL — AB (ref 6.5–8.1)

## 2016-03-11 LAB — GLUCOSE, CAPILLARY
GLUCOSE-CAPILLARY: 112 mg/dL — AB (ref 65–99)
GLUCOSE-CAPILLARY: 181 mg/dL — AB (ref 65–99)
Glucose-Capillary: 140 mg/dL — ABNORMAL HIGH (ref 65–99)
Glucose-Capillary: 189 mg/dL — ABNORMAL HIGH (ref 65–99)

## 2016-03-11 MED ORDER — GABAPENTIN 100 MG PO CAPS
100.0000 mg | ORAL_CAPSULE | Freq: Every day | ORAL | Status: DC
  Administered 2016-03-11 – 2016-03-18 (×8): 100 mg via ORAL
  Filled 2016-03-11 (×9): qty 1

## 2016-03-11 MED ORDER — MUSCLE RUB 10-15 % EX CREA
TOPICAL_CREAM | Freq: Three times a day (TID) | CUTANEOUS | Status: DC
  Administered 2016-03-11 – 2016-03-18 (×20): via TOPICAL
  Administered 2016-03-18: 1 via TOPICAL
  Administered 2016-03-18 – 2016-03-19 (×2): via TOPICAL
  Administered 2016-03-19: 1 via TOPICAL
  Administered 2016-03-20 – 2016-03-26 (×11): via TOPICAL
  Filled 2016-03-11: qty 85

## 2016-03-11 NOTE — Evaluation (Signed)
Physical Therapy Assessment and Plan  Patient Details  Name: [name] Colon MRN: [id number] Date of Birth: 05-Aug-1920  PT Diagnosis: Abnormal posture, Abnormality of gait, Cognitive deficits, Coordination disorder, Difficulty walking, Edema, Hemiplegia dominant, Hypotonia and Muscle weakness Rehab Potential: Fair ELOS: 25-28 days   Today's Date: 03/11/2016 PT Individual Time: [phone]    80 min   Problem List:  Patient Active Problem List   Diagnosis Date Noted  . Stroke due to embolism of left carotid artery ([place]) 03/10/2016  . Essential hypertension   . Thyroid activity decreased   . Diabetes mellitus type 2 in nonobese (HCC)   . Pulmonary hypertension ([place])   . Chronic diastolic congestive heart failure ([place])   . Cognitive deficit as late effect of traumatic brain injury ([place])   . Traumatic brain injury ([place])   . Multiple lacunar infarcts ([place])   . Hypertensive crisis 03/06/2016  . SDH (subdural hematoma) ([place]) 03/06/2016  . Cerebral thrombosis with cerebral infarction 03/06/2016  . Bowel obstruction ([place]) 09/28/2013  . SIRS (systemic inflammatory response syndrome) ([place]) 09/25/2013  . Thrombocytopenia ([place]) 09/25/2013  . Hypothyroidism 09/25/2013  . Leukopenia 09/25/2013  . Poor venous access 09/25/2013  . HTN (hypertension) 09/25/2013  . ?? Wide-complex tachycardia 09/25/2013  . Dehydration with hyponatremia 09/25/2013  . Claudication ([place]) 08/21/2013  . Type 2 diabetes mellitus ([place]) 08/21/2013    Past Medical History:  Past Medical History  Diagnosis Date  . Hypertension   . Diabetes mellitus   . Thyroid disease   . Claudication [facility])    Past Surgical History:  Past Surgical History  Procedure Laterality Date  . Appendectomy    . Tonsillectomy    . Cataract extraction      Assessment & Plan Clinical Impression: [name] Colon is a 82 y.o. female with history of HTN, DM who fell and struck her head with LOC about 10 seconds and was noted to have MS  changes on 03/05/16. She was evaluated in ED and CT head negative but BP elevated with SBP > 250. She was started on IV labetalol and was found to flaccid right hemiparesis. Repeat CT head showed small acute Left cerebellar and left posterior temporal lobe SDH with moderate right scalp hematoma and moderate to severe small vessel disease. CTA head/neck showed < 50% right ICA, 80-90% L-ICA stenosis and severe centrilobar emphysema with apical bullous changes. 2D echo with EF 60-65%, moderate LVH, moderate pulmonary HTN and grade 2 diastolic dysfunction.  Bilateral hip films negative for fracture. MRI brain showed acute cortical infarcts posterior L-MCA peri-rolandic cortex superimposed small acute lacunar infarcts right thalamus and small Left occipital and posterior hemorraghic contusions. Dr. Leonie Man consulted and felt that patient with Left MCA and L-PCA infarcts due to rapid BP lowering in setting of severe L-ICA stenosis but A fib still in DDX.   Follow up CCT 4/21was stable and ASA recommended but patient has declined this due to history of GI SE. Changed to plavix today. Repeat CT head today with acute non-hemorrhagic small to moderate posterior left frontal - parietal infarct and no evidence of hemorrhagic conversion and small SDH left occipital lobe without change. VVS consulted for input on ICA stenosis and patient to follow up in office. Patient with resultant dense right hemiplegia and dizziness affecting mobility and ability to carry out ADLs. Patient transferred to CIR on 03/10/2016.   Patient currently requires total with mobility secondary to muscle weakness, muscle joint tightness and muscle paralysis, decreased cardiorespiratoy endurance, impaired timing  and sequencing, abnormal tone, unbalanced muscle activation and decreased coordination, decreased midline orientation and decreased attention to right, decreased initiation, decreased attention, decreased awareness, decreased problem solving,  decreased safety awareness, decreased memory and delayed processing and decreased sitting balance, decreased standing balance, decreased postural control, hemiplegia and decreased balance strategies.  Prior to hospitalization, patient was modified independent  with mobility and lived with Son in a House home.  Home access is unknownStairs to enter.  Patient will benefit from skilled PT intervention to maximize safe functional mobility, minimize fall risk and decrease caregiver burden for planned discharge home with 24 hour assist versus SNF.  Anticipate patient will benefit from follow up [facility] or SNF at discharge.  PT - End of Session Activity Tolerance: Decreased this session;Tolerates 10 - 20 min activity with multiple rests Endurance Deficit: Yes Endurance Deficit Description: very fatigued with mobility, required supine rest break PT Assessment Rehab Potential (ACUTE/IP ONLY): Fair Barriers to Discharge: Decreased caregiver support PT Patient demonstrates impairments in the following area(s): Balance;Edema;Endurance;Motor;Nutrition;Pain;Perception;Safety PT Transfers Functional Problem(s): Bed Mobility;Bed to Chair;Car;Furniture PT Locomotion Functional Problem(s): Ambulation;Wheelchair Mobility;Stairs PT Plan PT Intensity: Minimum of 1-2 x/day ,45 to 90 minutes PT Frequency: 5 out of 7 days PT Duration Estimated Length of Stay: 25-28 days PT Treatment/Interventions: Ambulation/gait training;Balance/vestibular training;Cognitive remediation/compensation;Community reintegration;Discharge planning;Disease management/prevention;DME/adaptive equipment instruction;Functional electrical stimulation;Functional mobility training;Neuromuscular re-education;Pain management;Patient/family education;Psychosocial support;Splinting/orthotics;Stair training;Therapeutic Activities;Therapeutic Exercise;UE/LE Strength taining/ROM;UE/LE Coordination activities;Wheelchair propulsion/positioning;Visual/perceptual  remediation/compensation PT Transfers Anticipated Outcome(s): mod A PT Locomotion Anticipated Outcome(s): mod A wheelchair level PT Recommendation Follow Up Recommendations: 24 hour supervision/assistance;Home health PT;Skilled nursing facility (HHPT vs SNF) Patient destination: Home (daughter reports home vs SNF) Equipment Recommended: To be determined Equipment Details: pt owns RW and [facility]  Skilled Therapeutic Intervention Skilled therapeutic intervention initiated after completion of evaluation. Discussed with patient falls risk, safety within room, and focus of therapy during stay. Discussed possible length of stay, goals, and follow-up therapy. Patient's daughter present at beginning of session then departing til end of session. Patient with increased RUE/R hand pain to light touch/any movement, RN notified and discussed R hand splint with OT due to finger flexor tightness. Patient sat edge of bed with feet supported and LUE support for brief periods of time with supervision (<30 sec) before LOB anterior and to R requiring total A to prevent fall with no protective response noted. Patient performed sit <> stand with total A x 1 but was unable to hold up head/trunk or achieve full hip extension despite total A. Performed squat pivot transfer to TIS wheelchair with total A x 2 and patient pushing against arm rest instead of assisting with pulling herself toward arm rest during transfer. Patient visibly fatigued but reported she wanted to stay up in wheelchair until next session, left in tilt in space wheelchair with full lap tray and RUE supported on pillow with daughter present.   PT Evaluation Precautions/Restrictions Precautions Precautions: Fall Precaution Comments: R hemiparesis Restrictions Weight Bearing Restrictions: No Pain Pain Assessment Pain Assessment: 0-10 Pain Score: 10-Worst pain ever Pain Type: Acute pain Pain Location: Arm Pain Orientation: Right Pain Descriptors /  Indicators: Tingling Pain Onset: With Activity Pain Intervention(s): RN made aware;Repositioned Multiple Pain Sites: No Home Living/Prior Functioning Home Living Available Help at Discharge: Family Type of Home: House Home Access: Stairs to enter Home Layout: Able to live on main level with bedroom/bathroom;Two level Bathroom Shower/Tub: Tub/shower unit;Walk-in shower Bathroom Toilet: Standard Bathroom Accessibility: Yes Additional Comments: May DC to daughter's in [place], reports having more family  there  Lives With: Son Prior Function Level of Independence: Independent with transfers;Requires assistive device for independence;Independent with basic ADLs;Independent with gait;Independent with homemaking with ambulation  Able to Take Stairs?: Yes Driving: Yes Comments: used SPC for community mobility Vision/Perception   Defer to OT evaluation  Cognition Overall Cognitive Status: Impaired/Different from baseline Arousal/Alertness: Lethargic Orientation Level: Oriented X4 Focused Attention: Appears intact Sustained Attention: Impaired Sustained Attention Impairment: Verbal basic;Functional basic Memory: Impaired Memory Impairment: Decreased recall of new information Awareness: Impaired Awareness Impairment: Intellectual impairment Problem Solving: Impaired Problem Solving Impairment: Verbal basic;Functional basic Safety/Judgment: Impaired [employer] Scales of Cognitive Functioning: Automatic/appropriate Sensation Sensation Light Touch: Appears Intact Stereognosis: Not tested Hot/Cold: Appears Intact Proprioception: Impaired by gross assessment Coordination Gross Motor Movements are Fluid and Coordinated: No Fine Motor Movements are Fluid and Coordinated: No Motor  Motor Motor: Hemiplegia  Mobility Bed Mobility Bed Mobility: Rolling Left;Rolling Right;Supine to Sit;Sit to Supine Rolling Right: 1: +1 Total assist Rolling Left: 1: +1 Total assist Supine to  Sit: 1: +1 Total assist;With rails;HOB flat Sit to Supine: 1: +2 Total assist;HOB flat Transfers Transfers: Yes Sit to Stand: 1: +1 Total assist;From bed Stand to Sit: To bed;1: +1 Total assist Squat Pivot Transfers: 1: +2 Total assist;With armrests Locomotion  Ambulation Ambulation: No Gait Gait: No Stairs / Additional Locomotion Stairs: No Wheelchair Mobility Wheelchair Mobility: No (TIS wheelchair)  Trunk/Postural Assessment  Cervical Assessment Cervical Assessment: Exceptions to [facility] (forward flexed) Thoracic Assessment Thoracic Assessment: Exceptions to [facility] (kyphotic) Lumbar Assessment Lumbar Assessment: Exceptions to [facility] (posterior pelvic tilt) Postural Control Postural Control: Deficits on evaluation Protective Responses: absent  Balance Balance Balance Assessed: Yes Static Sitting Balance Static Sitting - Balance Support: Left upper extremity supported;Feet supported Static Sitting - Level of Assistance: 5: Stand by assistance;1: +1 Total assist Static Sitting - Comment/# of Minutes: able to maintain static sitting brief period of time with supervision before LOB to R and posterior and unable to correct Dynamic Sitting Balance Sitting balance - Comments: total A for sitting balance secondary to fatigue Static Standing Balance Static Standing - Level of Assistance: 1: +1 Total assist Extremity Assessment  RUE Assessment RUE Assessment: Exceptions to [facility] (0/5 flaccid) LUE Assessment LUE Assessment: Within Functional Limits RLE Assessment RLE Assessment: Exceptions to [facility] RLE Strength RLE Overall Strength: Deficits RLE Overall Strength Comments: 0/5 throughout RLE Tone RLE Tone: Flaccid LLE Assessment LLE Assessment: Within Functional Limits (not formally assessed)   See Function Navigator for Current Functional Status.   Refer to Care Plan for Long Term Goals  Recommendations for other services: None  Discharge Criteria: Patient will be discharged from  PT if patient refuses treatment 3 consecutive times without medical reason, if treatment goals not met, if there is a change in medical status, if patient makes no progress towards goals or if patient is discharged from hospital.  The above assessment, treatment plan, treatment alternatives and goals were discussed and mutually agreed upon: by patient  Laretta Alstrom 03/11/2016, 5:34 PM

## 2016-03-11 NOTE — Progress Notes (Signed)
[facility] PHYSICAL MEDICINE & REHABILITATION     PROGRESS NOTE    Subjective/Complaints: Had a pretty good night. Right shoulder still tender. Able to sleep. Ready for therapies this morning  ROS: Pt denies fever, rash/itching, headache, blurred or double vision, nausea, vomiting, abdominal pain, diarrhea, chest pain, shortness of breath, palpitations, dysuria, dizziness,   bleeding, anxiety, or depression   Objective: Vital Signs: Blood pressure 175/49, pulse 73, temperature 98.8 F (37.1 C), temperature source Oral, resp. rate 18, height 5' (1.524 m), weight 63.8 kg (140 lb 10.5 oz), SpO2 95 %. Ct Head Wo Contrast  03/10/2016  CLINICAL DATA:  82 year old hypertensive diabetic female post fall with right-sided weakness. Subsequent encounter. EXAM: CT HEAD WITHOUT CONTRAST TECHNIQUE: Contiguous axial images were obtained from the base of the skull through the vertex without intravenous contrast. COMPARISON:  03/06/2016 CT and MR. FINDINGS: Acute nonhemorrhagic small to moderate-size posterior left frontal-parietal infarct without CT evidence of hemorrhagic conversion. MR detected tiny left occipital lobe, right thalamic and anterior left cerebellar infarcts better delineated on recent MR. Prominent chronic micro vascular white matter changes. Small subdural hematoma left occipital lobe extending along the left tentorium without change. Small posterior lateral left temporal lobe hemorrhagic contusion measures up to 1.2 cm without significant change. Slight increased in amount of adjacent parenchymal edema. Global atrophy. Ventricular prominence probably related to atrophy rather than hydrocephalus and without change. Post lens replacement.  Exophthalmos. No skull fracture detected. Luc[place] right parietal calvarium unchanged. Mastoid air cells, middle ear cavities and visualized paranasal sinuses are clear. IMPRESSION: Acute nonhemorrhagic small to moderate-size posterior left frontal-parietal  infarct. No CT evidence of hemorrhagic conversion. MR detected tiny left occipital lobe, right thalamic and anterior left cerebellar infarcts better delineated on recent MR. Small subdural hematoma left occipital lobe extending along the left tentorium without change. Small posterior lateral left temporal lobe hemorrhagic contusion measures up to 1.2 cm without significant change. Slight increased in amount of adjacent parenchymal edema. Small vessel disease and global atrophy stable. Electronically Signed   By: Lacy DuverneySteven  Olson M.D.   On: 03/10/2016 05:58    Recent Labs  03/10/16 0736 03/11/16 0651  WBC 4.5 5.1  HGB 13.4 13.2  HCT 41.4 39.8  PLT 175 201    Recent Labs  03/10/16 0736 03/11/16 0651  NA 135 137  K 3.7 4.2  CL 104 107  GLUCOSE 149* 157*  BUN 15 17  CREATININE 0.72 0.80  CALCIUM 8.7* 8.6*   CBG (last 3)   Recent Labs  03/10/16 1751 03/10/16 2041 03/11/16 0641  GLUCAP 162* 140* 140*    Wt Readings from Last 3 Encounters:  03/11/16 63.8 kg (140 lb 10.5 oz)  03/08/16 61.6 kg (135 lb 12.9 oz)  11/30/13 59.875 kg (132 lb)    Physical Exam:  Constitutional: She is oriented to person, place, and time. She appears well-developed and well-nourished.  HENT:  Head: Normocephalic.  Mouth/Throat: mild Oropharyngeal exudate present.  Eyes: Conjunctivae are normal. Pupils are equal, round, and reactive to light.  Neck: Normal range of motion. Neck supple.  Cardiovascular: Normal rate and regular rhythm.  Murmur heard. Respiratory: Effort normal and breath sounds normal. No respiratory distress. She exhibits no tenderness.  GI: Soft. Bowel sounds are normal. She exhibits no distension. There is no tenderness.  Musculoskeletal: She exhibits edema and tenderness (Right hip with attempts at ROM).    Right shoulder very tender with palpation and PROM.  Neurological: She is alert and oriented to person, place,  and time. A cranial nerve deficit is present.  Soft  voice--improved verbal output. Does have fair insight and awareness. HOH?. Right inattention with right field cut. Right central 7. Dense right hemiparesis 0/5 in the right upper extremity to lower extremity proximal to distal. Right hamstring with early tone while in supine.  Skin: Skin is warm and dry. Right heel with medial bruising/pressure area. Callus left heel Psychiatric: Her speech is delayed. She is slowed and withdrawn. She is inattentive.     Assessment/Plan: 1. 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 Physiatrist is providing close team supervision and 24 hour management of active medical problems listed below. Physiatrist and rehab team continue to assess barriers to discharge/monitor patient progress toward functional and medical goals.  Function:  Bathing Bathing position      Bathing parts      Bathing assist        Upper Body Dressing/Undressing Upper body dressing                    Upper body assist        Lower Body Dressing/Undressing Lower body dressing                                  Lower body assist        Toileting Toileting          Toileting assist     Transfers Chair/bed transfer             Locomotion Ambulation           Wheelchair          Cognition Comprehension    Expression    Social Interaction    Problem Solving    Memory     Medical Problem List and Plan: 1. Right hemiparesis and cognitive deficits secondary to TBI and L-MCA infarcts.  2. DVT Prophylaxis/Anticoagulation: Pharmaceutical: Lovenox 3. Pain Management: Tylenol prn -right shoulder pain after fall. Has hx of RTC issues and prior surgery bilaterally -xrays on acute negative -support RUE/shoulder as possible  -sports cream to help with  pain, consider steroid injection 4. Mood: Team to provide ego support. LCSW to follow for evaluation and support.  5. Neuropsych: This patient is not fully capable of making decisions on her own behalf. 6. Skin/Wound Care: Routine pressure relief measures. Maintain adequate nutrition and hydration status. prevalon boots for heels 7. Fluids/Electrolytes/Nutrition: continue to encourage PO  -for the most part labs normal, all personally reviewed.  8. HTN: Monitor BP tid - continue coreg bid and Catapres tid. 9. DM type 2: Hgb A1c- 7.5. Check blood sugars ac/hs. Use SSI for elevated BS--consider oral agent  -sugars with fair control so far .  LOS (Days) 1 A FACE TO FACE EVALUATION WAS PERFORMED  Maxmilian Trostel T 03/11/2016 8:59 AM

## 2016-03-11 NOTE — Evaluation (Signed)
Speech Language Pathology Assessment and Plan  Patient Details  Name: [name] Colon MRN: [id number] Date of Birth: [birth year]  SLP Diagnosis: Dysarthria;Cognitive Impairments  Rehab Potential: Excellent ELOS: 25-28 days     Today's Date: 03/11/2016 SLP Individual Time: 1100-1200 SLP Individual Time Calculation (min): 60 min   Problem List:  Patient Active Problem List   Diagnosis Date Noted  . Stroke due to embolism of left carotid artery ([place]) 03/10/2016  . Essential hypertension   . Thyroid activity decreased   . Diabetes mellitus type 2 in nonobese (HCC)   . Pulmonary hypertension ([place])   . Chronic diastolic congestive heart failure ([place])   . Cognitive deficit as late effect of traumatic brain injury ([place])   . Traumatic brain injury ([place])   . Multiple lacunar infarcts ([place])   . Hypertensive crisis 03/06/2016  . SDH (subdural hematoma) ([place]) 03/06/2016  . Cerebral thrombosis with cerebral infarction 03/06/2016  . Bowel obstruction ([place]) 09/28/2013  . SIRS (systemic inflammatory response syndrome) ([place]) 09/25/2013  . Thrombocytopenia ([place]) 09/25/2013  . Hypothyroidism 09/25/2013  . Leukopenia 09/25/2013  . Poor venous access 09/25/2013  . HTN (hypertension) 09/25/2013  . ?? Wide-complex tachycardia 09/25/2013  . Dehydration with hyponatremia 09/25/2013  . Claudication ([place]) 08/21/2013  . Type 2 diabetes mellitus ([place]) 08/21/2013   Past Medical History:  Past Medical History  Diagnosis Date  . Hypertension   . Diabetes mellitus   . Thyroid disease   . Claudication [facility])    Past Surgical History:  Past Surgical History  Procedure Laterality Date  . Appendectomy    . Tonsillectomy    . Cataract extraction      Assessment / Plan / Recommendation Clinical Impression Patient is a 82 y.o. female with history of HTN, DM who fell and struck her head with LOC about 10 seconds and was noted to have MS changes on 03/05/16. She was evaluated in ED and CT head  negative but BP elevated with SBP > 250. She was started on IV labetalol and was found to flaccid right hemiparesis. Repeat CT head showed small acute Left cerebellar and left posterior temporal lobe SDH with moderate right scalp hematoma and moderate to severe small vessel disease. CTA head/neck showed < 50% right ICA, 80-90% L-ICA stenosis and severe centrilobar emphysema with apical bullous changes. 2D echo with EF 60-65%, moderate LVH, moderate pulmonary HTN and grade 2 diastolic dysfunction. MRI brain showed acute cortical infarcts posterior L-MCA peri-rolandic cortex superimposed small acute lacunar infarcts right thalamus and small Left occipital and posterior hemorraghic contusions. Dr. Leonie Man consulted and felt that patient with Left MCA and L-PCA infarcts due to rapid BP lowering in setting of severe L-ICA stenosis but A fib still in DDX. Follow up CCT 4/21was stable and ASA recommended but patient has declined this due to history of GI SE. Changed to plavix today. Repeat CT head today with acute non-hemorrhagic small to moderate posterior left frontal - parietal infarct and no evidence of hemorrhagic conversion and small SDH left occipital lobe without change. VVS consulted for input on ICA stenosis and patient to follow up in office. Patient with resultant dense right hemiplegia and dizziness affecting mobility and ability to carry out ADLs. Therapy ongoing and CIR recommended for follow up therapy and patient admitted 03/10/16.  Patient demonstrates behaviors consistent with a [place] Level VII and requires overall Min-Mod A verbal cues to complete functional and familiar tasks safely in regards to attention, attention to right field of environment, problem solving,  awareness of deficits, recall and overall safety.  Patient also demonstrates a decreased vocal intensity which impacts her overall intelligibility at the phrase level. Patient's audiotry comprehension and verbal expression was [facility] for all  tasks assessed today, however, may benefit from higher-level diagnostic treatment.  Patient would benefit from skilled SLP intervention to maximize her cognitive function and speech intelligibility in order to maximize her overall functional independence prior to discharge home.    Skilled Therapeutic Interventions          Administered a cognitive-linguistic evaluation. Please see above for details. Educated the patient in regards to her current cognitive-linguistic impairments and goals of skilled SLP intervention. She verbalized understanding.    SLP Assessment  Patient will need skilled [facility] during CIR admission    Recommendations  Oral Care Recommendations: Oral care BID Patient destination: Home Follow up Recommendations: Home Health SLP;Outpatient SLP;24 hour supervision/assistance Equipment Recommended: None recommended by SLP    SLP Frequency 3 to 5 out of 7 days   SLP Duration  SLP Intensity  SLP Treatment/Interventions 25-28 days   Minumum of 1-2 x/day, 30 to 90 minutes  Cognitive remediation/compensation;Cueing hierarchy;Functional tasks;Patient/family education;Therapeutic Activities;Internal/external aids;Environmental controls;Speech/Language facilitation    Pain Intermittent grimacing when moving her RUE. RN made aware and patient repositioned   Function:   Cognition Comprehension Comprehension assist level: Understands basic 90% of the time/cues < 10% of the time  Expression   Expression assist level: Expresses basic 75 - 89% of the time/requires cueing 10 - 24% of the time. Needs helper to occlude trach/needs to repeat words.  Social Interaction Social Interaction assist level: Interacts appropriately 75 - 89% of the time - Needs redirection for appropriate language or to initiate interaction.  Problem Solving Problem solving assist level: Solves basic 50 - 74% of the time/requires cueing 25 - 49% of the time  Memory Memory assist  level: Recognizes or recalls 75 - 89% of the time/requires cueing 10 - 24% of the time   Short Term Goals: Week 1: SLP Short Term Goal 1 (Week 1): Patient will demonstrate sustained attention to functional tasks for 15 mintues with Min A verbal cues for redirection SLP Short Term Goal 2 (Week 1): Patient will attend to right field of enviornment during functional tasks with Min A verbal cues.  SLP Short Term Goal 3 (Week 1): Patient will identify 2 physical and 2 cognitive deficits with Mod A verbal and contextual cues.  SLP Short Term Goal 4 (Week 1): Patient will demonstrate functional problem solving for basic and familiar tasks with Min A verbal cues.  SLP Short Term Goal 5 (Week 1): Patient will recall 2 events from previous therapy sessions with supervision question cues.  SLP Short Term Goal 6 (Week 1): Patient will utilize an increased vocal intensity at the phrase level to achieve 90% intelligibility with supervision verbal cues.   Refer to Care Plan for Long Term Goals  Recommendations for other services: Neuropsych  Discharge Criteria: Patient will be discharged from SLP if patient refuses treatment 3 consecutive times without medical reason, if treatment goals not met, if there is a change in medical status, if patient makes no progress towards goals or if patient is discharged from hospital.  The above assessment, treatment plan, treatment alternatives and goals were discussed and mutually agreed upon: by patient  Mattye Verdone 03/11/2016, 3:20 PM

## 2016-03-11 NOTE — Evaluation (Signed)
Occupational Therapy Assessment and Plan  Patient Details  Name: [name] Colon MRN: [id number] Date of Birth: Jun 06, 1920  OT Diagnosis: acute pain, cognitive deficits, disturbance of vision, hemiplegia affecting dominant side and coordination disorder Rehab Potential: Rehab Potential (ACUTE ONLY): Fair ELOS: 21-24 days   Today's Date: 03/11/2016 OT Individual Time: 9326-7124 OT Individual Time Calculation (min): 45 min     Problem List:  Patient Active Problem List   Diagnosis Date Noted  . Stroke due to embolism of left carotid artery ([place]) 03/10/2016  . Essential hypertension   . Thyroid activity decreased   . Diabetes mellitus type 2 in nonobese (HCC)   . Pulmonary hypertension ([place])   . Chronic diastolic congestive heart failure ([place])   . Cognitive deficit as late effect of traumatic brain injury ([place])   . Traumatic brain injury ([place])   . Multiple lacunar infarcts ([place])   . Hypertensive crisis 03/06/2016  . SDH (subdural hematoma) ([place]) 03/06/2016  . Cerebral thrombosis with cerebral infarction 03/06/2016  . Bowel obstruction ([place]) 09/28/2013  . SIRS (systemic inflammatory response syndrome) ([place]) 09/25/2013  . Thrombocytopenia ([place]) 09/25/2013  . Hypothyroidism 09/25/2013  . Leukopenia 09/25/2013  . Poor venous access 09/25/2013  . HTN (hypertension) 09/25/2013  . ?? Wide-complex tachycardia 09/25/2013  . Dehydration with hyponatremia 09/25/2013  . Claudication ([place]) 08/21/2013  . Type 2 diabetes mellitus ([place]) 08/21/2013    Past Medical History:  Past Medical History  Diagnosis Date  . Hypertension   . Diabetes mellitus   . Thyroid disease   . Claudication [facility])    Past Surgical History:  Past Surgical History  Procedure Laterality Date  . Appendectomy    . Tonsillectomy    . Cataract extraction      Assessment & Plan Clinical Impression: Patient is a 82 y.o. year old female with history of HTN, DM who fell and struck her head with LOC about 10  seconds and was noted to have MS changes on 03/05/16. She was evaluated in ED and CT head negative but BP elevated with SBP > 250. She was started on IV labetalol and was found to flaccid right hemiparesis. Repeat CT head showed small acute Left cerebellar and left posterior temporal lobe SDH with moderate right scalp hematoma and moderate to severe small vessel disease. CTA head/neck showed < 50% right ICA, 80-90% L-ICA stenosis and severe centrilobar emphysema with apical bullous changes. 2D echo with EF 60-65%, moderate LVH, moderate pulmonary HTN and grade 2 diastolic dysfunction. Bilateral hip films negative for fracture. MRI brain showed acute cortical infarcts posterior L-MCA peri-rolandic cortex superimposed small acute lacunar infarcts right thalamus and small Left occipital and posterior hemorraghic contusions. Dr. Leonie Man consulted and felt that patient with Left MCA and L-PCA infarcts due to rapid BP lowering in setting of severe L-ICA stenosis but A fib still in DDX.transferred to CIR on 03/10/2016 .    Patient currently requires total with basic self-care skills secondary to muscle weakness, decreased cardiorespiratoy endurance, decreased coordination, R inattention, decreased awareness, decreased problem solving, decreased safety awareness and decreased memory and decreased sitting balance, decreased standing balance, decreased postural control, hemiplegia and decreased balance strategies.  Prior to hospitalization, patient could complete ADLs and IADLs with modified independent .  Patient will benefit from skilled intervention to decrease level of assist with basic self-care skills prior to discharge home with care partner.  Anticipate patient will require moderate physical assestance and follow up home health.  OT - End of Session Activity Tolerance:  Decreased this session Endurance Deficit: Yes Endurance Deficit Description: very fatigued with mobility, required supine rest break OT  Assessment Rehab Potential (ACUTE ONLY): Fair OT Patient demonstrates impairments in the following area(s): Balance;Behavior;Cognition;Endurance;Motor;Pain;Edema;Perception;Safety;Sensory;Vision OT Basic ADL's Functional Problem(s): Eating;Grooming;Bathing;Dressing;Toileting OT Advanced ADL's Functional Problem(s):  (n/a) OT Transfers Functional Problem(s): Toilet OT Additional Impairment(s): Fuctional Use of Upper Extremity OT Plan OT Intensity: Minimum of 1-2 x/day, 45 to 90 minutes OT Frequency: 5 out of 7 days OT Duration/Estimated Length of Stay: 21-24 days OT Treatment/Interventions: Balance/vestibular training;Cognitive remediation/compensation;Community reintegration;Self Care/advanced ADL retraining;UE/LE Coordination activities;Functional mobility training;Visual/perceptual remediation/compensation;Wheelchair propulsion/positioning;Splinting/orthotics;Neuromuscular re-education;Discharge planning;Therapeutic Activities;Patient/family education;Therapeutic Exercise;DME/adaptive equipment instruction;Psychosocial support;UE/LE Strength taining/ROM OT Self Feeding Anticipated Outcome(s): set up A OT Basic Self-Care Anticipated Outcome(s): mod A overall OT Toileting Anticipated Outcome(s): mod A overall OT Bathroom Transfers Anticipated Outcome(s): mod A OT Recommendation Patient destination: home with caregiver Follow Up Recommendations: mod physical assist;24 hour supervision/assistance Equipment Recommended: To be determined   Skilled Therapeutic Intervention Upon entering the room, pt in bed with HOB elevated and friend present in the room. Pt's friend is feeding her lunch with total A. Pt reports extreme fatigue this session and is lethargic and unable to keep eyes open at times. Pt declines bathing and dressing task. Pt seated on EOB with total A but requires total A for seated balance as well and returned back to bed for safety. Pt engaged in grooming tasks with set up A and total  A for oral care in order to remove dentures and for hygiene. OT educating pt on OT purpose, POC, and goals with pt verbalizing understanding but education to continue.   OT Evaluation Precautions/Restrictions  Precautions Precautions: Fall Precaution Comments: R hemiparesis Restrictions Weight Bearing Restrictions: No General OT Amount of Missed Time: 15 Minutes Vital Signs Therapy Vitals Temp: 97.9 F (36.6 C) Temp Source: Oral Pulse Rate: 72 Resp: 16 BP: 131/72 mmHg Patient Position (if appropriate): Lying Oxygen Therapy SpO2: 98 % O2 Device: Not Delivered Pain Pain Assessment Pain Assessment: 0-10 Pain Score: 10-Worst pain ever Pain Type: Acute pain Pain Location: Arm Pain Orientation: Right Pain Descriptors / Indicators: Tingling Pain Onset: With Activity Pain Intervention(s): RN made aware;Repositioned Multiple Pain Sites: No Home Living/Prior [place] expects to be discharged to:: Unsure Living Arrangements: Children Available Help at Discharge: Family Type of Home: House Home Access: Stairs to enter [employer] of Steps: unknown Home Layout: Able to live on main level with bedroom/bathroom, Two level Bathroom Shower/Tub: Tub/shower unit, Multimedia programmer: Standard Bathroom Accessibility: Yes Additional Comments: May DC to daughter's in [place], reports having more family there  Lives With: Son IADL History Homemaking Responsibilities: No Prior Function Level of Independence: Independent with transfers, Requires assistive device for independence, Independent with basic ADLs, Independent with gait, Independent with homemaking with ambulation  Able to Take Stairs?: Yes Driving: Yes Leisure: Hobbies-yes (Comment) Comments: used SPC for community mobility Vision/Perception  Vision- History Baseline Vision/History: Wears glasses Wears Glasses: Reading only Patient Visual Report: Blurring of  vision Vision- Assessment Vision Assessment?: Vision impaired- to be further tested in functional context  Cognition Overall Cognitive Status: Impaired/Different from baseline Arousal/Alertness: Lethargic Orientation Level: Person;Place;Situation Person: Oriented Place: Oriented Situation: Oriented Year: 2017 Month: April Day of Week: Incorrect Memory: Impaired Memory Impairment: Decreased recall of new information Immediate Memory Recall: Sock;Blue;Bed Memory Recall: Sock;Blue;Bed Memory Recall Sock: Without Cue Memory Recall Blue: Without Cue Memory Recall Bed: With Cue Focused Attention: Appears intact Sustained Attention: Impaired Sustained Attention Impairment: Verbal  basic;Functional basic Awareness: Impaired Awareness Impairment: Intellectual impairment Problem Solving: Impaired Problem Solving Impairment: Verbal basic;Functional basic Safety/Judgment: Impaired [place] [employer] Scales of Cognitive Functioning: Automatic/appropriate Sensation Sensation Light Touch: Appears Intact Stereognosis: Not tested Hot/Cold: Appears Intact Proprioception: Impaired by gross assessment Coordination Gross Motor Movements are Fluid and Coordinated: No Fine Motor Movements are Fluid and Coordinated: No Motor  Motor Motor: Hemiplegia Mobility  Bed Mobility Bed Mobility: Rolling Left;Rolling Right;Supine to Sit;Sit to Supine Rolling Right: 1: +1 Total assist Rolling Left: 1: +1 Total assist Supine to Sit: 1: +1 Total assist;With rails;HOB flat Sit to Supine: 1: +2 Total assist;HOB flat Transfers Sit to Stand: 1: +1 Total assist;From bed Stand to Sit: To bed;1: +1 Total assist  Trunk/Postural Assessment  Cervical Assessment Cervical Assessment: Exceptions to [facility] (forward flexed) Thoracic Assessment Thoracic Assessment: Exceptions to [facility] (kyphotic) Lumbar Assessment Lumbar Assessment: Exceptions to [facility] (posterior pelvic tilt) Postural Control Postural Control: Deficits  on evaluation Protective Responses: absent  Balance Balance Balance Assessed: Yes Static Sitting Balance Static Sitting - Balance Support: Left upper extremity supported;Feet supported Static Sitting - Level of Assistance: 5: Stand by assistance;1: +1 Total assist Static Sitting - Comment/# of Minutes: able to maintain static sitting brief period of time with supervision before LOB to R and posterior and unable to correct Dynamic Sitting Balance Sitting balance - Comments: total A for sitting balance secondary to fatigue Static Standing Balance Static Standing - Level of Assistance: 1: +1 Total assist Extremity/Trunk Assessment RUE Assessment RUE Assessment: Exceptions to [facility] (0/5 flaccid) LUE Assessment LUE Assessment: Within Functional Limits   See Function Navigator for Current Functional Status.   Refer to Care Plan for Long Term Goals  Recommendations for other services: None  Discharge Criteria: Patient will be discharged from OT if patient refuses treatment 3 consecutive times without medical reason, if treatment goals not met, if there is a change in medical status, if patient makes no progress towards goals or if patient is discharged from hospital.  The above assessment, treatment plan, treatment alternatives and goals were discussed and mutually agreed upon: by patient  Phineas Semen 03/11/2016, 5:35 PM

## 2016-03-11 NOTE — Plan of Care (Signed)
Problem: RH PAIN MANAGEMENT Goal: RH STG PAIN MANAGED AT OR BELOW PT'S PAIN GOAL < 4 Outcome: Not Progressing C/o 10/10 pain in right arm whenever touched.

## 2016-03-11 NOTE — Progress Notes (Signed)
Patient information reviewed and entered into eRehab system by Tora DuckMarie Uma Jerde, RN, CRRN, PPS Coordinator.  Information including medical coding and functional independence measure will be reviewed and updated through discharge.     Per nursing patient and daughter was given "Data Collection Information Summary for Patients in Inpatient Rehabilitation Facilities with attached "Privacy Act Statement-Health Care Records" upon admission.

## 2016-03-12 ENCOUNTER — Inpatient Hospital Stay (HOSPITAL_COMMUNITY): Payer: Medicare Other | Admitting: Occupational Therapy

## 2016-03-12 ENCOUNTER — Inpatient Hospital Stay (HOSPITAL_COMMUNITY): Payer: Medicare Other | Admitting: Speech Pathology

## 2016-03-12 ENCOUNTER — Inpatient Hospital Stay (HOSPITAL_COMMUNITY): Payer: Medicare Other | Admitting: Physical Therapy

## 2016-03-12 DIAGNOSIS — E119 Type 2 diabetes mellitus without complications: Secondary | ICD-10-CM

## 2016-03-12 LAB — GLUCOSE, CAPILLARY
GLUCOSE-CAPILLARY: 149 mg/dL — AB (ref 65–99)
GLUCOSE-CAPILLARY: 156 mg/dL — AB (ref 65–99)
GLUCOSE-CAPILLARY: 110 mg/dL — AB (ref 65–99)
GLUCOSE-CAPILLARY: 168 mg/dL — AB (ref 65–99)

## 2016-03-12 LAB — URINE CULTURE: CULTURE: NO GROWTH

## 2016-03-12 NOTE — Progress Notes (Signed)
[facility]IC[facility]ILITATION     PROGRESS NOTE    Subjective/Complaints: Tolerated therapies yesterday. Had a good night. Still trying "to wake up" this morning  ROS: Pt denies fever, rash/itching, headache, blurred or double vision, nausea, vomiting, abdominal pain, diarrhea, chest pain, shortness of breath, palpitations, dysuria, dizziness,   bleeding, anxiety, or depression   Objective: Vital Signs: Blood pressure 188/53, pulse 65, temperature 99 F (37.2 C), temperature source Oral, resp. rate 18, height 5' (1.524 m), weight 57.607 kg (127 lb), SpO2 98 %. No results found.  Recent Labs  03/10/16 0736 03/11/16 0651  WBC 4.5 5.1  HGB 13.4 13.2  HCT 41.4 39.8  PLT 175 201    Recent Labs  03/10/16 0736 03/11/16 0651  NA 135 137  K 3.7 4.2  CL 104 107  GLUCOSE 149* 157*  BUN 15 17  CREATININE 0.72 0.80  CALCIUM 8.7* 8.6*   CBG (last 3)   Recent Labs  03/11/16 1644 03/11/16 2138 03/12/16 0658  GLUCAP 112* 181* 149*    Wt Readings from Last 3 Encounters:  03/12/16 57.607 kg (127 lb)  03/08/16 61.6 kg (135 lb 12.9 oz)  11/30/13 59.875 kg (132 lb)    Physical Exam:  Constitutional: She is oriented to person, place, and time. She appears well-developed and well-nourished.  HENT:  Head: Normocephalic.  Mouth/Throat: oral mucosa moist.  Eyes: Conjunctivae are normal. Pupils are equal, round, and reactive to light.  Neck: Normal range of motion. Neck supple.  Cardiovascular: Normal rate and regular rhythm.  Murmur heard. Respiratory: Effort normal and breath sounds normal. No respiratory distress. She exhibits no tenderness.  GI: Soft. Bowel sounds are normal. She exhibits no distension. There is no tenderness.  Musculoskeletal: She exhibits edema and tenderness (Right shoulder with attempts at ROM).    Right shoulder very tender with palpation and PROM.  Neurological: She is alert and oriented to person, place, and time. A cranial  nerve deficit is present.  Soft voice--improved verbal output. Does have fair insight and awareness. HOH?. Right inattention with right field cut. Right central 7. Dense right hemiparesis 0/5 in the right upper extremity to lower extremity proximal to distal. Right hamstring with early tone while in supine.  Skin: Skin is warm and dry. Right heel with medial bruising/pressure area. Callus left heel Psychiatric: Her speech is delayed. She is slowed and withdrawn. She is inattentive.     Assessment/Plan: 1. 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 Physiatrist is providing close team supervision and 24 hour management of active medical problems listed below. Physiatrist and rehab team continue to assess barriers to discharge/monitor patient progress toward functional and medical goals.  Function:  Bathing Bathing position Bathing activity did not occur: Refused    Bathing parts      Bathing assist        Upper Body Dressing/Undressing Upper body dressing Upper body dressing/undressing activity did not occur: Refused                  Upper body assist        Lower Body Dressing/Undressing Lower body dressing Lower body dressing/undressing activity did not occur: Refused                                Lower body assist  Toileting Toileting Toileting activity did not occur: No continent bowel/bladder event        Toileting assist     Transfers Chair/bed transfer   Chair/bed transfer method: Squat pivot Chair/bed transfer assist level: 2 helpers Chair/bed transfer assistive device: Armrests     Locomotion Ambulation Ambulation activity did not occur: Safety/medical concerns         Wheelchair   Type: Manual (TIS wheelchair)   Assist Level: Dependent (Pt equals 0%)  Cognition Comprehension  Comprehension assist level: Understands basic 90% of the time/cues < 10% of the time  Expression Expression assist level: Expresses basic 75 - 89% of the time/requires cueing 10 - 24% of the time. Needs helper to occlude trach/needs to repeat words.  Social Interaction Social Interaction assist level: Interacts appropriately 75 - 89% of the time - Needs redirection for appropriate language or to initiate interaction.  Problem Solving Problem solving assist level: Solves basic 50 - 74% of the time/requires cueing 25 - 49% of the time  Memory Memory assist level: Recognizes or recalls 75 - 89% of the time/requires cueing 10 - 24% of the time   Medical Problem List and Plan: 1. Right hemiparesis and cognitive deficits secondary to TBI and L-MCA infarcts.   -continue CIR therapies, 3.5 hours per day 2. DVT Prophylaxis/Anticoagulation: Pharmaceutical: Lovenox 3. Pain Management: Tylenol prn -right shoulder pain after fall. Has hx of RTC issues and prior surgery bilaterally -xrays on acute negative -supporting RUE/shoulder as possible  -sports cream to help with pain, consider steroid injection if pain remains inhibiting 4. Mood: Team to provide ego support. LCSW to follow for evaluation and support.  5. Neuropsych: This patient is not fully capable of making decisions on her own behalf. 6. Skin/Wound Care: Routine pressure relief measures. Maintain adequate nutrition and hydration status. prevalon boots for heels 7. Fluids/Electrolytes/Nutrition: continue to encourage PO  -follow up labs as indicated.  8. HTN: Monitor BP tid - continue coreg bid and Catapres tid. 9. DM type 2: Hgb A1c- 7.5. Check blood sugars ac/hs. Use SSI for elevated BS--consider oral agent  -sugars with fair control so far ---no changes yet  .  LOS (Days) 2 A FACE TO FACE EVALUATION WAS PERFORMED  Eastyn Dattilo T 03/12/2016 8:52 AM

## 2016-03-12 NOTE — Progress Notes (Signed)
Occupational Therapy Session Note  Patient Details  Name: [name] Colon [name] Colon: [id number] Date of Birth: 11/21/1919  Today's Date: 03/12/2016 OT Individual Time: [id number] OT Individual Time Calculation (min): 75 min    Short Term Goals: Week 1:  OT Short Term Goal 1 (Week 1): Pt will perform toilet transfer with assist of 1 person in order to decrease level of assist with functional transfer. OT Short Term Goal 2 (Week 1): Pt will engage in 5 minutes of self care tasks with 2 or less rest breaks secondary to fatigue.  OT Short Term Goal 3 (Week 1): Pt will perform UB dressing with max A in order to decrease level of assist with self care.  OT Short Term Goal 4 (Week 1): Pt will perform grooming at sink with set up A in order to increase I in self care.   Skilled Therapeutic Interventions/Progress Updates:    Pt seen for skilled OT to facilitate attention, alertness, trunk control, attention to R side of body and visual field with ADL retraining. Pt received in bed and was alert at start of session, she did answer questions about her family with some cuing.  Pt worked on bathing from bed level with max A. RUE very sensitive to touch so pt encouraged to wash her own arm. Therapist lifted her arm slightly to allow her to reach it. Pt able to tolerate washing her own arm.  Worked on rolling in bed with total assist. Once in sidelying pt able to use L arm to hold bed rail. Tolerated rolling with movement of RUE. Total A to sit to EOB. Worked on static sitting for 10 min prior to donning UB clothing.  Pt requires total A with sitting as she leans back and to the R. Max cues and hand over hand facilitation to place L hand out to side to correct balance. Pt was only able to attend to sitting for a few seconds at a time.  Pt needed frequent redirection.  Pt was becoming very lethargic at this point. Pt transferred to w/c with +2 total A to squat pivot.  Pt more alert once in chair. Positioned with lap tray  and with hip abductor support.  Pt worked on cleansing dentures.  Daughter arrived back in room to assist with fixing her hair.    Therapy Documentation Precautions:  Precautions Precautions: Fall Precaution Comments: R hemiparesis Restrictions Weight Bearing Restrictions: No    Vital Signs: Therapy Vitals BP: (!) 188/53 mmHg Pain: Pain Assessment Pain Assessment: Faces Faces Pain Scale: Hurts little more Pain Location: Arm Pain Orientation: Right Pain Descriptors / Indicators: Sharp Pain Onset: With Activity ADL:     See Function Navigator for Current Functional Status.   Therapy/Group: Individual Therapy  SAGUIER,JULIA 03/12/2016, 11:17 AM

## 2016-03-12 NOTE — Progress Notes (Signed)
Speech Language Pathology Daily Session Note  Patient Details  Name: Ivar Drapelizabeth M Cheramie MRN: [id number] Date of Birth: 04/15/1920  Today's Date: 03/12/2016 SLP Individual Time: 1433-1500 SLP Individual Time Calculation (min): 27 min  Short Term Goals: Week 1: SLP Short Term Goal 1 (Week 1): Patient will demonstrate sustained attention to functional tasks for 15 mintues with Min A verbal cues for redirection SLP Short Term Goal 2 (Week 1): Patient will attend to right field of enviornment during functional tasks with Min A verbal cues.  SLP Short Term Goal 3 (Week 1): Patient will identify 2 physical and 2 cognitive deficits with Mod A verbal and contextual cues.  SLP Short Term Goal 4 (Week 1): Patient will demonstrate functional problem solving for basic and familiar tasks with Min A verbal cues.  SLP Short Term Goal 5 (Week 1): Patient will recall 2 events from previous therapy sessions with supervision question cues.  SLP Short Term Goal 6 (Week 1): Patient will utilize an increased vocal intensity at the phrase level to achieve 90% intelligibility with supervision verbal cues.   Skilled Therapeutic Interventions:  Pt was seen for skilled ST targeting cognitive goals.  Pt with obvious left gaze preference upon arrival and required mod assist verbal cues to attend to therapist when seated to her right.  Pt was able to sustain her attention to functional conversations with SLP for ~5 minutes with mod verbal cues for redirection.   Pt with decreased vocal intensity and limited attempts to improve intelligibility despite mod verbal cues from therapist.  Pt left in bed with call bell within reach and quick release belt donned.     Function:  Eating Eating                 Cognition Comprehension Comprehension assist level: Understands basic 90% of the time/cues < 10% of the time  Expression   Expression assist level: Expresses basic 75 - 89% of the time/requires cueing 10 - 24% of the  time. Needs helper to occlude trach/needs to repeat words.  Social Interaction Social Interaction assist level: Interacts appropriately 75 - 89% of the time - Needs redirection for appropriate language or to initiate interaction.  Problem Solving Problem solving assist level: Solves basic 50 - 74% of the time/requires cueing 25 - 49% of the time  Memory Memory assist level: Recognizes or recalls 50 - 74% of the time/requires cueing 25 - 49% of the time    Pain Pain Assessment Pain Assessment: No/denies pain  Therapy/Group: Individual Therapy  Jacarie Pate, Melanee SpryNicole L 03/12/2016, 4:39 PM

## 2016-03-12 NOTE — Progress Notes (Signed)
Physical Therapy Session Note  Patient Details  Name: [name] Colon MRN: [id number] Date of Birth: 10/18/1920  Today's Date: 03/12/2016 PT Individual Time: 1530-1630 PT Individual Time Calculation (min): 60 min   Short Term Goals: Week 1:  PT Short Term Goal 1 (Week 1): Patient will maintain static sitting balance x 5 min with supervision.  PT Short Term Goal 2 (Week 1): Patient will recall pressure relief frequency and technique when up in TIS wheelchair with min cues.  PT Short Term Goal 3 (Week 1): Patient will initiate ambulation.  PT Short Term Goal 4 (Week 1): Patient will tolerate standing with use of equipment x 5 min.   Skilled Therapeutic Interventions/Progress Updates:   Patient asleep in bed with daughter at bedside. Session focused on arousal, attention, and following one step commands for bed mobility with total A for rolling R and L using bed rails to don pants total A and transfer supine > sit, squat pivot transfers bed <> wheelchair with total A x 2 and patient unable to coordinate pulling self toward arm rest, multiple attempts at sit <> stand from wheelchair in parallel bars with total A and mirror for visual feedback but unable to achieve upright standing due to impaired trunk control, impaired motor planning, and fatigue, and sitting balance/trunk NMR with LUE reaching for objects to facilitate anterior weight shift to lift back off wheelchair and in supported sitting in wheelchair with posterior LOB.  Patient required max multimodal cues for command following with functional mobility and max multimodal cues to attend to R side. Transferred back to bed with +2A and engaged in rolling to doff pants and perform hygiene with total A due to urinary incontinence. Patient left in bed with RN and NT present.    Therapy Documentation Precautions:  Precautions Precautions: Fall Precaution Comments: R hemiparesis Restrictions Weight Bearing Restrictions: No Pain: Pain  Assessment Pain Assessment: Faces Faces Pain Scale: Hurts even more Pain Location: Generalized Pain Orientation: Right Pain Onset: With Activity Pain Intervention(s): Repositioned;Rest   See Function Navigator for Current Functional Status.   Therapy/Group: Individual Therapy  Kerney ElbeVarner, Chaniyah Jahr A 03/12/2016, 4:58 PM

## 2016-03-12 NOTE — Progress Notes (Signed)
Orthopedic Tech Progress Note Patient Details:  [name] Colon 08/26/1920 [id number]  Patient ID: [name] Colon, female   DOB: 10/15/1920, 82 y.o.   MRN: [id number]   [name] Colon 03/12/2016, 9:55 [facility].

## 2016-03-12 NOTE — Progress Notes (Signed)
Speech Language Pathology Daily Session Note  Patient Details  Name: [name] Colon MRN: [id number] Date of Birth: 03/12/1920  Today's Date: 03/12/2016 SLP Individual Time: 1100-1200 SLP Individual Time Calculation (min): 60 min  Short Term Goals: Week 1: SLP Short Term Goal 1 (Week 1): Patient will demonstrate sustained attention to functional tasks for 15 mintues with Min A verbal cues for redirection SLP Short Term Goal 2 (Week 1): Patient will attend to right field of enviornment during functional tasks with Min A verbal cues.  SLP Short Term Goal 3 (Week 1): Patient will identify 2 physical and 2 cognitive deficits with Mod A verbal and contextual cues.  SLP Short Term Goal 4 (Week 1): Patient will demonstrate functional problem solving for basic and familiar tasks with Min A verbal cues.  SLP Short Term Goal 5 (Week 1): Patient will recall 2 events from previous therapy sessions with supervision question cues.  SLP Short Term Goal 6 (Week 1): Patient will utilize an increased vocal intensity at the phrase level to achieve 90% intelligibility with supervision verbal cues.   Skilled Therapeutic Interventions: Skilled treatment session focused on cognitive and speech goals. SLP initially provided Mod A question cues which faded to supervision by end of session for verbal reasoning and specificity during a verbal descripton task. SLP also provided Min A verbal cues for patient to utilize an increased vocal intensity to achieve 90% intelligibility at the phrase and sentence level. Patient demonstrated sustained attention to task for ~20 minutes with Min A verbal cues for redirection and attended to right field of environment with Min a verbal cues. Patient transferred back to bed at end of session with total +2 assist. Patient left supine in bed with NT and family present. Continue with current plan of care.    Function:  Cognition Comprehension Comprehension assist level: Understands basic  90% of the time/cues < 10% of the time  Expression   Expression assist level: Expresses basic 75 - 89% of the time/requires cueing 10 - 24% of the time. Needs helper to occlude trach/needs to repeat words.  Social Interaction Social Interaction assist level: Interacts appropriately 75 - 89% of the time - Needs redirection for appropriate language or to initiate interaction.  Problem Solving Problem solving assist level: Solves basic 50 - 74% of the time/requires cueing 25 - 49% of the time  Memory Memory assist level: Recognizes or recalls 50 - 74% of the time/requires cueing 25 - 49% of the time    Pain No/Denies Pain   Therapy/Group: Individual Therapy  [name] Colon 03/12/2016, 9:21 PM

## 2016-03-13 ENCOUNTER — Inpatient Hospital Stay (HOSPITAL_COMMUNITY): Payer: Medicare Other | Admitting: Physical Therapy

## 2016-03-13 ENCOUNTER — Inpatient Hospital Stay (HOSPITAL_COMMUNITY): Payer: Medicare Other | Admitting: Speech Pathology

## 2016-03-13 ENCOUNTER — Inpatient Hospital Stay (HOSPITAL_COMMUNITY): Payer: Medicare Other | Admitting: Occupational Therapy

## 2016-03-13 LAB — URINALYSIS, ROUTINE W REFLEX MICROSCOPIC
BILIRUBIN URINE: NEGATIVE
Glucose, UA: NEGATIVE mg/dL
KETONES UR: NEGATIVE mg/dL
NITRITE: NEGATIVE
PROTEIN: 30 mg/dL — AB
Specific Gravity, Urine: 1.021 (ref 1.005–1.030)
pH: 5.5 (ref 5.0–8.0)

## 2016-03-13 LAB — GLUCOSE, CAPILLARY
GLUCOSE-CAPILLARY: 173 mg/dL — AB (ref 65–99)
Glucose-Capillary: 139 mg/dL — ABNORMAL HIGH (ref 65–99)
Glucose-Capillary: 162 mg/dL — ABNORMAL HIGH (ref 65–99)
Glucose-Capillary: 122 mg/dL — ABNORMAL HIGH (ref 65–99)

## 2016-03-13 LAB — URINE MICROSCOPIC-ADD ON

## 2016-03-13 MED ORDER — OXYMETAZOLINE HCL 0.05 % NA SOLN
1.0000 | Freq: Two times a day (BID) | NASAL | Status: DC
  Filled 2016-03-13: qty 15

## 2016-03-13 MED ORDER — SALINE SPRAY 0.65 % NA SOLN
1.0000 | Freq: Two times a day (BID) | NASAL | Status: DC | PRN
  Filled 2016-03-13: qty 44

## 2016-03-13 MED ORDER — OXYMETAZOLINE HCL 0.05 % NA SOLN
1.0000 | Freq: Two times a day (BID) | NASAL | Status: DC | PRN
  Filled 2016-03-13: qty 15

## 2016-03-13 NOTE — Progress Notes (Signed)
Physical Therapy Session Note  Patient Details  Name: [name] Colon MRN: [id number] Date of Birth: 06/09/1920  Today's Date: 03/13/2016 PT Individual Time: 1000-1130 PT Individual Time Calculation (min): 90 min   Short Term Goals: Week 1:  PT Short Term Goal 1 (Week 1): Patient will maintain static sitting balance x 5 min with supervision.  PT Short Term Goal 2 (Week 1): Patient will recall pressure relief frequency and technique when up in TIS wheelchair with min cues.  PT Short Term Goal 3 (Week 1): Patient will initiate ambulation.  PT Short Term Goal 4 (Week 1): Patient will tolerate standing with use of equipment x 5 min.   Skilled Therapeutic Interventions/Progress Updates: nsf  Patient up in wheelchair upon arrival, reporting need to toilet. Session focused on squat pivot transfers to L and R with max A to +2A from wheelchair > bed > BSC > bed > wheelchair, sitting balance on BSC with supervision-min A with LUE support on sink, max-total A for sit <> partial stand while NT performed clothing management and hygiene with total A and patient c/o L knee pain with transfers. Chart review of imaging showed no new injury to L knee s/p fall on 03/05/16. Performed squat pivot transfer to mat table with max A and back to wheelchair at end of session with +2A. Performed sitting balance EOM with close supervision-max A with LUE support and max verbal/tactile cues for hand placement, forward gaze, and postural control with mirror for visual feedback and wedge positioned under R hip to facilitate ipsilateral lateral trunk shortening and contralateral lateral trunk elongation. Patient demonstrates posterior pelvic tilt, kyphosis, and forward flexed neck requiring max manual cues for upright posture but unable to maintain > 5 sec without facilitation. Patient asking, "Why can't I hold my head up?" Education provided regarding injury and impairments and focus of therapy. Patient also c/o neck pain, RN  notified. Patient stated she was lightheaded, seated BP 151/96. Patient returned to wheelchair, adjusted R leg rest, and performed pressure relief in TIS wheelchair x 2 min with pillow for neck support. Patient requesting to stay up in chair, left in room with family present.   Therapy Documentation Precautions:  Precautions Precautions: Fall Precaution Comments: R hemiparesis Restrictions Weight Bearing Restrictions: No Pain: Pain Assessment Pain Assessment: 0-10 Pain Score: 9  Faces Pain Scale: Hurts a little bit Pain Type: Acute pain Pain Location: Neck Pain Orientation: Right Pain Descriptors / Indicators: Aching;Guarding;Grimacing Pain Onset: With Activity Pain Intervention(s): Repositioned;Rest;RN made aware   See Function Navigator for Current Functional Status.   Therapy/Group: Individual Therapy  Kerney ElbeVarner, Dwan Hemmelgarn A 03/13/2016, 12:35 PM

## 2016-03-13 NOTE — Progress Notes (Signed)
[facility] PHYSICAL MEDICINE & REHABILITATION     PROGRESS NOTE    Subjective/Complaints: Feels tired. Said that was woken up often overnight/early in am  ROS: Pt denies fever, rash/itching, headache, blurred or double vision, nausea, vomiting, abdominal pain, diarrhea, chest pain, shortness of breath, palpitations, dysuria, dizziness,   bleeding, anxiety, or depression   Objective: Vital Signs: Blood pressure 166/60, pulse 63, temperature 99.4 F (37.4 C), temperature source Oral, resp. rate 18, height 5' (1.524 m), weight 63.7 kg (140 lb 6.9 oz), SpO2 98 %. No results found.  Recent Labs  03/11/16 0651  WBC 5.1  HGB 13.2  HCT 39.8  PLT 201    Recent Labs  03/11/16 0651  NA 137  K 4.2  CL 107  GLUCOSE 157*  BUN 17  CREATININE 0.80  CALCIUM 8.6*   CBG (last 3)   Recent Labs  03/12/16 1630 03/12/16 2045 03/13/16 0634  GLUCAP 110* 168* 139*    Wt Readings from Last 3 Encounters:  03/13/16 63.7 kg (140 lb 6.9 oz)  03/08/16 61.6 kg (135 lb 12.9 oz)  11/30/13 59.875 kg (132 lb)    Physical Exam:  Constitutional: She is oriented to person, place, and time. She appears well-developed and well-nourished.  HENT:  Head: Normocephalic.  Mouth/Throat: oral mucosa moist.  Eyes: Conjunctivae are normal. Pupils are equal, round, and reactive to light.  Neck: Normal range of motion. Neck supple.  Cardiovascular: Normal rate and regular rhythm.  Murmur heard. Respiratory: Effort normal and breath sounds normal. No respiratory distress. She exhibits no tenderness.  GI: Soft. Bowel sounds are normal. She exhibits no distension. There is no tenderness.  Musculoskeletal:      Right shoulder still tender with  PROM.  Neurological: She is alert and oriented to person, place, and time. A cranial nerve deficit is present.  Soft voice--improved verbal output. Does have fair insight and awareness.  Right inattention with right field cut. Right central 7. Dense right  hemiparesis 0/5 in the right upper extremity to lower extremity proximal to distal. Right hamstring with early tone while in supine.  Skin: Skin is warm and dry. Right heel with medial bruising/pressure area. Callus left heel Psychiatric: Her speech is delayed. She is slowed and withdrawn. She is inattentive.     Assessment/Plan: 1. 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 Physiatrist is providing close team supervision and 24 hour management of active medical problems listed below. Physiatrist and rehab team continue to assess barriers to discharge/monitor patient progress toward functional and medical goals.  Function:  Bathing Bathing position Bathing activity did not occur: Refused Position: Bed  Bathing parts Body parts bathed by patient: Chest, Abdomen, Front perineal area, Left upper leg, Right arm Body parts bathed by helper: Buttocks, Right upper leg, Right lower leg, Left lower leg, Back, Left arm  Bathing assist Assist Level: Touching or steadying assistance(Pt > 75%)      Upper Body Dressing/Undressing Upper body dressing Upper body dressing/undressing activity did not occur: Refused What is the patient wearing?: Bra, Pull over shirt/dress   Bra - Perfomed by helper: Thread/unthread right bra strap, Thread/unthread left bra strap, Hook/unhook bra (pull down sports bra)   Pull over shirt/dress - Perfomed by helper: Thread/unthread right sleeve, Thread/unthread left sleeve, Put head through opening, Pull shirt over trunk        Upper body assist  Lower Body Dressing/Undressing Lower body dressing Lower body dressing/undressing activity did not occur: Refused What is the patient wearing?: Pants, Socks, Shoes       Pants- Performed by helper: Thread/unthread right pants leg, Thread/unthread left pants leg, Pull  pants up/down       Socks - Performed by helper: Don/doff right sock, Don/doff left sock   Shoes - Performed by helper: Don/doff right shoe, Don/doff left shoe, Fasten right, Fasten left          Lower body assist        Toileting Toileting Toileting activity did not occur: No continent bowel/bladder event        Toileting assist     Transfers Chair/bed transfer   Chair/bed transfer method: Squat pivot Chair/bed transfer assist level: 2 helpers Chair/bed transfer assistive device: Armrests     Locomotion Ambulation Ambulation activity did not occur: Safety/medical concerns         Wheelchair   Type: Manual   Assist Level: Dependent (Pt equals 0%)  Cognition Comprehension Comprehension assist level: Understands basic 90% of the time/cues < 10% of the time  Expression Expression assist level: Expresses basic 75 - 89% of the time/requires cueing 10 - 24% of the time. Needs helper to occlude trach/needs to repeat words.  Social Interaction Social Interaction assist level: Interacts appropriately 75 - 89% of the time - Needs redirection for appropriate language or to initiate interaction.  Problem Solving Problem solving assist level: Solves basic 50 - 74% of the time/requires cueing 25 - 49% of the time  Memory Memory assist level: Recognizes or recalls 50 - 74% of the time/requires cueing 25 - 49% of the time   Medical Problem List and Plan: 1. Right hemiparesis and cognitive deficits secondary to TBI and L-MCA infarcts.   -continue CIR therapies, 3.5 hours per day 2. DVT Prophylaxis/Anticoagulation: Pharmaceutical: Lovenox 3. Pain Management: Tylenol prn -right shoulder pain after fall. Has hx of RTC issues and prior surgery bilaterally -xrays on acute negative -supporting RUE/shoulder as possible  -continue sports cream to help with pain, consider steroid injection if pain remains inhibiting 4. Mood: Team to provide ego  support and positive reinforcement. Has supportive family.  5. Neuropsych: This patient is not fully capable of making decisions on her own behalf. 6. Skin/Wound Care: Routine pressure relief measures. Maintain adequate nutrition and hydration status. prevalon boots for heels 7. Fluids/Electrolytes/Nutrition: continue to encourage PO  -follow up labs as indicated.  8. HTN: Monitor BP tid - continue coreg bid and Catapres tid. 9. DM type 2: Hgb A1c- 7.5. Check blood sugars ac/hs. Use SSI for elevated BS   -sugars under reasonable control  .  LOS (Days) 3 A FACE TO FACE EVALUATION WAS PERFORMED  Kynslie Ringle T 03/13/2016 9:15 AM

## 2016-03-13 NOTE — IPOC Note (Signed)
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)     Functional Problem List: Nursing Bladder, Bowel, Edema, Endurance, Medication Management, Motor, Sensory, Skin Integrity  PT Balance, Edema, Endurance, Motor, Nutrition, Pain, Perception, Safety  OT Balance, Behavior, Cognition, Endurance, Motor, Pain, Edema, Perception, Safety, Sensory, Vision  SLP Cognition  TR         Basic ADL's: OT Eating, Grooming, Bathing, Dressing, Toileting     Advanced  ADL's: OT  (n/a)     Transfers: PT Bed Mobility, Bed to Chair, Car, Oncologist: PT Ambulation, Psychologist, prison and probation services, Stairs     Additional Impairments: OT Fuctional Use of Upper Extremity  SLP Social Cognition   Problem Solving, Memory, Attention, Awareness  TR      Anticipated Outcomes Item Anticipated Outcome  Self Feeding set up A  Swallowing      Basic self-care  mod A overall  Toileting  mod A overall   Bathroom Transfers mod A  Bowel/Bladder  min assist  Transfers  mod A  Locomotion  mod A wheelchair level  Communication     Cognition  Supervision   Pain  N/a  Safety/Judgment  Mod I   Therapy Plan: PT Intensity: Minimum of 1-2 x/day ,45 to 90 minutes PT Frequency: 5 out of 7 days PT Duration Estimated Length of Stay: 25-28 days OT Intensity: Minimum of 1-2 x/day, 45 to 90 minutes OT Frequency: 5 out of 7 days OT Duration/Estimated Length of Stay: 21-24 days SLP Intensity: Minumum of 1-2 x/day, 30 to 90 minutes SLP  Frequency: 3 to 5 out of 7 days SLP Duration/Estimated Length of Stay: 25-28 days        Team Interventions: Nursing Interventions Patient/Family Education, Bladder Management, Bowel Management, Disease Management/Prevention, Skin Care/Wound Management, Medication Management, Discharge Planning  PT interventions Ambulation/gait training, Balance/vestibular training, Cognitive remediation/compensation, Community reintegration, Discharge planning, Disease management/prevention, DME/adaptive equipment instruction, Functional electrical stimulation, Functional mobility training, Neuromuscular re-education, Pain management, Patient/family education, Psychosocial support, Splinting/orthotics, Stair training, Therapeutic Activities, Therapeutic Exercise, UE/LE Strength taining/ROM, UE/LE Coordination activities, Wheelchair propulsion/positioning, Visual/perceptual remediation/compensation  OT Interventions Warden/ranger, Cognitive remediation/compensation, Community reintegration, Self Care/advanced ADL retraining, UE/LE Coordination activities, Functional mobility training, Visual/perceptual remediation/compensation, Wheelchair propulsion/positioning, Splinting/orthotics, Neuromuscular re-education, Discharge planning, Therapeutic Activities, Patient/family education, Therapeutic Exercise, DME/adaptive equipment instruction, Psychosocial support, UE/LE Strength taining/ROM  SLP Interventions Cognitive remediation/compensation, Financial trader, Functional tasks, Patient/family education, Therapeutic Activities, Internal/external aids, Environmental controls, Speech/Language facilitation  TR Interventions    SW/CM Interventions Discharge Planning, Psychosocial Support, Patient/Family Education    Team Discharge Planning: Destination: PT-Home (daughter reports home vs SNF) ,OT- Skilled Nursing Facility (SNF) , SLP-Home Projected Follow-up: PT-24 hour supervision/assistance, Home health PT,  Skilled nursing facility (HHPT vs SNF), OT-  Skilled nursing facility, 24 hour supervision/assistance, SLP-Home Health SLP, Outpatient SLP, 24 hour supervision/assistance Projected Equipment Needs: PT-To be determined, OT- To be determined, SLP-None recommended by SLP Equipment Details: PT-pt owns RW and SPC, OT-  Patient/family involved in discharge planning: PT- Patient,  OT-Patient, SLP-Patient  MD ELOS: 25 days Medical Rehab Prognosis:  Excellent Assessment: The patient has been admitted for CIR therapies with the diagnosis of TBI/CVA. The team will be addressing functional mobility, strength, stamina, balance, safety, adaptive techniques and equipment, self-care, bowel and bladder mgt, patient and caregiver education, NMR, cognitive perceptual rx, speech/communication, cognition, ego suppoprt. Goals have been set at min to mod assist with basic mobility and self-care and supervision with cognition. Pt is somewhat limited in activity tolerance and therapy is limited to a max of 3.5 hours per day at this point.     Ranelle OysterZachary T. Swartz, MD, FAAPMR      See Team Conference Notes for weekly updates to the plan of care

## 2016-03-13 NOTE — Progress Notes (Signed)
Occupational Therapy Session Note  Patient Details  Name: [name] Colon MRN: [id number] Date of Birth: 07/11/1920  Today's Date: 03/13/2016 OT Individual Time: [id number] OT Individual Time Calculation (min): 60 min    Short Term Goals: Week 1:  OT Short Term Goal 1 (Week 1): Pt will perform toilet transfer with assist of 1 person in order to decrease level of assist with functional transfer. OT Short Term Goal 2 (Week 1): Pt will engage in 5 minutes of self care tasks with 2 or less rest breaks secondary to fatigue.  OT Short Term Goal 3 (Week 1): Pt will perform UB dressing with max A in order to decrease level of assist with self care.  OT Short Term Goal 4 (Week 1): Pt will perform grooming at sink with set up A in order to increase I in self care.   Skilled Therapeutic Interventions/Progress Updates:    Pt seen for skilled OT to facilitate trunk control, R visual scanning, and adaptive bed mobility strategies to protect her R painful arm. Pt received in bed. Pt worked on LB bathing with max A with HOB elevated to reach legs.  Rolling in flat bed, pt cradled R arm with L arm and both knees flexed. Pt cued to roll leading with head turns to A with rolling to allow therapist to don briefs and pants over hips. Pt sat to EOB with max-total A. Once sitting EOB, pt demonstrated improved trunk control with max A. She needed cues to use L arm out to side to assist her with achieving midline. Improved trunk extension with head lift.  Pt transferred with squat pivot to wc with max A of 2 using L leg to push forward.  Once in chair, pt became more alert and completed UB bathing mod A and dressing max A with cues to fully attend to R arm. Visual scanning to R with mod cues to find items on sink.  Mod cues with oral care for sequencing.  Pt in w/c with lap tray, call light in reach.  Pt's daughter in room with patient.  Therapy Documentation Precautions: .  Precautions Precautions: Fall Precaution  Comments: R hemiparesis Restrictions Weight Bearing Restrictions: No    Vital Signs: Therapy Vitals Temp: 99.4 F (37.4 C) Temp Source: Oral BP: (!) 166/60 mmHg Pain: Pain Assessment Faces Pain Scale: Hurts a little bit Pain Type: Acute pain Pain Location: Arm Pain Orientation: Right Pain Descriptors / Indicators: Aching Pain Onset: With Activity   ADL:  See Function Navigator for Current Functional Status.   Therapy/Group: Individual Therapy  SAGUIER,JULIA 03/13/2016, 9:58 AM

## 2016-03-13 NOTE — Progress Notes (Signed)
Speech Language Pathology Daily Session Note  Patient Details  Name: [name] Colon MRN: [id number] Date of Birth: 05/09/1920  Today's Date: 03/13/2016 SLP Individual Time: 1300-1355 SLP Individual Time Calculation (min): 55 min  Short Term Goals: Week 1: SLP Short Term Goal 1 (Week 1): Patient will demonstrate sustained attention to functional tasks for 15 mintues with Min A verbal cues for redirection SLP Short Term Goal 2 (Week 1): Patient will attend to right field of enviornment during functional tasks with Min A verbal cues.  SLP Short Term Goal 3 (Week 1): Patient will identify 2 physical and 2 cognitive deficits with Mod A verbal and contextual cues.  SLP Short Term Goal 4 (Week 1): Patient will demonstrate functional problem solving for basic and familiar tasks with Min A verbal cues.  SLP Short Term Goal 5 (Week 1): Patient will recall 2 events from previous therapy sessions with supervision question cues.  SLP Short Term Goal 6 (Week 1): Patient will utilize an increased vocal intensity at the phrase level to achieve 90% intelligibility with supervision verbal cues.   Skilled Therapeutic Interventions: Skilled treatment session focused on cognitive goals. SLP facilitated session by providing Mod A verbal cues for sustained attention to basic problem solving tasks for ~2 minute intervals due to fatigue. SLP also facilitated session by providing Mod A verbal cues for problem solving and emergent awareness of errors during a basic money management task. Patient attended to right field of environment during functional tasks with Max A verbal cues and required Min-Mod A verbal cues for use of an increased vocal intensity at the phrase level. Patient left upright in chair with all needs within reach and family present. Continue with current plan of care.    Function:   Cognition Comprehension Comprehension assist level: Understands basic 90% of the time/cues < 10% of the time   Expression   Expression assist level: Expresses basic 75 - 89% of the time/requires cueing 10 - 24% of the time. Needs helper to occlude trach/needs to repeat words.  Social Interaction Social Interaction assist level: Interacts appropriately 75 - 89% of the time - Needs redirection for appropriate language or to initiate interaction.  Problem Solving Problem solving assist level: Solves basic 50 - 74% of the time/requires cueing 25 - 49% of the time  Memory Memory assist level: Recognizes or recalls 50 - 74% of the time/requires cueing 25 - 49% of the time    Pain No/Denies Pain  Therapy/Group: Individual Therapy  Adelene Polivka 03/13/2016, 4:36 PM

## 2016-03-14 ENCOUNTER — Inpatient Hospital Stay (HOSPITAL_COMMUNITY): Payer: Medicare Other

## 2016-03-14 ENCOUNTER — Inpatient Hospital Stay (HOSPITAL_COMMUNITY): Payer: Medicare Other | Admitting: Speech Pathology

## 2016-03-14 ENCOUNTER — Inpatient Hospital Stay (HOSPITAL_COMMUNITY): Payer: Medicare Other | Admitting: Physical Therapy

## 2016-03-14 DIAGNOSIS — I63132 Cerebral infarction due to embolism of left carotid artery: Secondary | ICD-10-CM

## 2016-03-14 DIAGNOSIS — I5032 Chronic diastolic (congestive) heart failure: Secondary | ICD-10-CM

## 2016-03-14 LAB — GLUCOSE, CAPILLARY
Glucose-Capillary: 131 mg/dL — ABNORMAL HIGH (ref 65–99)
Glucose-Capillary: 154 mg/dL — ABNORMAL HIGH (ref 65–99)
Glucose-Capillary: 222 mg/dL — ABNORMAL HIGH (ref 65–99)

## 2016-03-14 NOTE — Progress Notes (Signed)
Speech Language Pathology Daily Session Note  Patient Details  Name: [name] Colon MRN: [id number] Date of Birth: 10/27/1920  Today's Date: 03/14/2016 SLP Individual Time: 1500-1530 SLP Individual Time Calculation (min): 30 min  Short Term Goals: Week 1: SLP Short Term Goal 1 (Week 1): Patient will demonstrate sustained attention to functional tasks for 15 mintues with Min A verbal cues for redirection SLP Short Term Goal 2 (Week 1): Patient will attend to right field of enviornment during functional tasks with Min A verbal cues.  SLP Short Term Goal 3 (Week 1): Patient will identify 2 physical and 2 cognitive deficits with Mod A verbal and contextual cues.  SLP Short Term Goal 4 (Week 1): Patient will demonstrate functional problem solving for basic and familiar tasks with Min A verbal cues.  SLP Short Term Goal 5 (Week 1): Patient will recall 2 events from previous therapy sessions with supervision question cues.  SLP Short Term Goal 6 (Week 1): Patient will utilize an increased vocal intensity at the phrase level to achieve 90% intelligibility with supervision verbal cues.   Skilled Therapeutic Interventions: Skilled ST session focused on cognitive goals. Pt appeared very fatigued and sleepy at beginning of session. SLP facilitated session by providing Max A verbal cues for sustained attention to basic problem solving tasks for ~462minutes secondary to pt fatigue. Pt also required Max A to Mod A verbal cues for basic problem solving. Pt required Mod A verbal cues for vocal intensity at the word level. Patient left upright in chair with lapboard in place with all needs within reach. Continue current plan of care.   Function:  Cognition Comprehension Comprehension assist level: Understands basic 90% of the time/cues < 10% of the time  Expression   Expression assist level: Expresses basic 75 - 89% of the time/requires cueing 10 - 24% of the time. Needs helper to occlude trach/needs to repeat  words.  Social Interaction Social Interaction assist level: Interacts appropriately 75 - 89% of the time - Needs redirection for appropriate language or to initiate interaction.  Problem Solving Problem solving assist level: Solves basic 50 - 74% of the time/requires cueing 25 - 49% of the time  Memory Memory assist level: Recognizes or recalls 50 - 74% of the time/requires cueing 25 - 49% of the time    Pain    Therapy/Group: Individual Therapy  Julaine Zimny 03/14/2016, 3:32 PM

## 2016-03-14 NOTE — Progress Notes (Signed)
Occupational Therapy Session Note  Patient Details  Name: [name] Colon MRN: [id number] Date of Birth: 12/18/1919  Today's Date: 03/14/2016 OT Individual Time: 1100-1200 OT Individual Time Calculation (min): 60 min    Short Term Goals: Week 1:  OT Short Term Goal 1 (Week 1): Pt will perform toilet transfer with assist of 1 person in order to decrease level of assist with functional transfer. OT Short Term Goal 2 (Week 1): Pt will engage in 5 minutes of self care tasks with 2 or less rest breaks secondary to fatigue.  OT Short Term Goal 3 (Week 1): Pt will perform UB dressing with max A in order to decrease level of assist with self care.  OT Short Term Goal 4 (Week 1): Pt will perform grooming at sink with set up A in order to increase I in self care.   Skilled Therapeutic Interventions/Progress Updates: Therapeutic activity with focus on improved body awareness, sensory/motor integration of RUE, family education to daughter on AAROM and Self-ROM (written HEP placed in Patient Handbook), attention, and supported sitting balance.  Pt received seated in her w/c with right hand in resting splint supported with full-lap tray.   Per pt., whe already dressed and was assisted with self-care prior to session.   OT then educated daughter Leotis ShamesLauren present on benefit of stimulation and attention to RUE using self or assisted AROM with splint off.  OT removed splint and noted no evidence of hypertonicity to extremity although digits were stiff at IPJs d/t functional position of splint.   Using written HEP, pt completed all SROM with mod assist to support RUE as she moved through exercises with moderate vc to lift her head and watch her arm while performing exercises.   Daughter observed session intermittently d/t presence of 2 other women engaging daughter in private discussion related to discharge plans outside of pt's room.   Pt demo'd good attention to task, improved with removal of IV line causing discomfort  while pt attempts to interlace her fingers.   Pt left in her w/c with daughter attending to all her needs.     Therapy Documentation Precautions:  Precautions Precautions: Fall Precaution Comments: R hemiparesis Restrictions Weight Bearing Restrictions: No   Pain: Pain Assessment Pain Assessment: No/denies pain  Exercises: General Exercises - Upper Extremity Shoulder Flexion: Self ROM;Seated;10 reps;AAROM;Right Shoulder ABduction: Self ROM;AAROM;10 reps;Seated;Right Elbow Flexion: AAROM;Self ROM;10 reps;Seated;Right Elbow Extension: AAROM;Self ROM;10 reps;Seated;Power Tower Wrist Flexion: AAROM;Self ROM;Seated;10 reps;Right Wrist Extension: AAROM;Self ROM;Right;10 reps;Seated Digit Composite Flexion: AAROM;Right;Self ROM;5 reps;Seated Composite Extension: AAROM;Self ROM;Right;10 reps;Seated  See Function Navigator for Current Functional Status.   Therapy/Group: Individual Therapy  Tambi Thole 03/14/2016, 12:47 PM

## 2016-03-14 NOTE — Progress Notes (Signed)
Physical Therapy Session Note  Patient Details  Name: [name] Colon MRN: [id number] Date of Birth: Oct 15, 1920  Today's Date: 03/14/2016 PT Individual Time: 0900-1001 PT Individual Time Calculation (min): 61 min   Short Term Goals: Week 1:  PT Short Term Goal 1 (Week 1): Patient will maintain static sitting balance x 5 min with supervision.  PT Short Term Goal 2 (Week 1): Patient will recall pressure relief frequency and technique when up in TIS wheelchair with min cues.  PT Short Term Goal 3 (Week 1): Patient will initiate ambulation.  PT Short Term Goal 4 (Week 1): Patient will tolerate standing with use of equipment x 5 min.   Skilled Therapeutic Interventions/Progress Updates:    Pt received resting in bed with no c/o pain and agreeable to therapy session.  Session focus on bed mobility for LB dressing, sitting balance for UB dressing, midline orientation, and transfers.  Pt rolled to R and L with total assist for LB dressing.  Pt wearing pants, socks, and shoes which were all donned with total assist.  Supine>sit through R side lying with total assist to come to side lying, advance LEs off EOB, and bring trunk to upright.  Pt with initially zero static sitting balance with R trunk shortening and L trunk elongation.  Able to come closer to midline with max multimodal cues but unable to achieve static sitting balance without assist.  +2 assist for squat pivot to w/c and initially mod assist to maintain upright posture, progress to supervision.  UB dressing at w/c level with more than a reasonable amount of time and max assist for management of clothing.  Pt taken to therapy gym and performed L lateral lean for positioning of wedge underneath R hip for facilitation of midline posture in sitting.  Pt returned to midline and positioned in front of mirror with improvement in R versus L trunk length and pt able to sit upright/midline with supervision.  Pt engaged in dynamic sitting task to address  return to midline reaching forward/L for cards and matching on back of mirror.  Pt able to return to midline each time with min>mod verbal cues.  Pt returned to room in w/c at end of session and left upright with lap tray in place, call bell in reach, and needs met.   Therapy Documentation Precautions:  Precautions Precautions: Fall Precaution Comments: R hemiparesis Restrictions Weight Bearing Restrictions: No   See Function Navigator for Current Functional Status.   Therapy/Group: Individual Therapy  Earnest Conroy Penven-Crew 03/14/2016, 12:03 PM

## 2016-03-14 NOTE — Progress Notes (Signed)
[place] PHYSICAL MEDICINE & REHABILITATION     PROGRESS NOTE    Subjective/Complaints: PT notes LE pain with movement  ROS: Pt denies fever, rash/itching, headache, blurred or double vision, nausea, vomiting, abdominal pain, diarrhea, chest pain, shortness of breath, palpitations, dysuria, dizziness,   bleeding, anxiety, or depression   Objective: Vital Signs: Blood pressure 156/44, pulse 65, temperature 98.3 F (36.8 C), temperature source Oral, resp. rate 18, height 5' (1.524 m), weight 63.7 kg (140 lb 6.9 oz), SpO2 95 %. No results found. No results for input(s): WBC, HGB, HCT, PLT in the last 72 hours. No results for input(s): NA, K, CL, GLUCOSE, BUN, CREATININE, CALCIUM in the last 72 hours.  Invalid input(s): CO CBG (last 3)   Recent Labs  03/13/16 1647 03/13/16 2150 03/14/16 0646  GLUCAP 122* 173* 131*    Wt Readings from Last 3 Encounters:  03/13/16 63.7 kg (140 lb 6.9 oz)  03/08/16 61.6 kg (135 lb 12.9 oz)  11/30/13 59.875 kg (132 lb)    Physical Exam:  Constitutional: She is oriented to person, place, and time. She appears well-developed and well-nourished.  HENT:  Head: Normocephalic.  Mouth/Throat: oral mucosa moist.  Eyes: Conjunctivae are normal. Pupils are equal, round, and reactive to light.  Neck: Normal range of motion. Neck supple.  Cardiovascular: Normal rate and regular rhythm.  Murmur heard. Respiratory: Effort normal and breath sounds normal. No respiratory distress. She exhibits no tenderness.  GI: Soft. Bowel sounds are normal. She exhibits no distension. There is no tenderness.  Musculoskeletal:      Right shoulder still tender with  PROM.  Neurological: She is alert and oriented to person, place, and time. A cranial nerve deficit is present.  Soft voice--improved verbal output. Does have fair insight and awareness.  Right inattention with right field cut. Right central 7. Dense right hemiparesis 0/5 in the right upper extremity to  lower extremity proximal to distal. Right hamstring with early tone while in supine.  Skin: Skin is warm and dry. Right heel with medial bruising/pressure area. Callus left heel Psychiatric: Her speech is delayed. She is slowed and withdrawn. She is inattentive.  Ext no swelling or erythema RLE. No calf tenderness   Assessment/Plan: 1. 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 Physiatrist is providing close team supervision and 24 hour management of active medical problems listed below. Physiatrist and rehab team continue to assess barriers to discharge/monitor patient progress toward functional and medical goals.  Function:  Bathing Bathing position Bathing activity did not occur: Refused Position: Bed  Bathing parts Body parts bathed by patient: Chest, Abdomen, Front perineal area, Left upper leg, Right arm, Right upper leg Body parts bathed by helper: Buttocks, Right lower leg, Left lower leg, Back, Left arm  Bathing assist Assist Level: Touching or steadying assistance(Pt > 75%)      Upper Body Dressing/Undressing Upper body dressing Upper body dressing/undressing activity did not occur: Refused What is the patient wearing?: Bra, Pull over shirt/dress   Bra - Perfomed by helper: Thread/unthread right bra strap, Thread/unthread left bra strap, Hook/unhook bra (pull down sports bra) Pull over shirt/dress - Perfomed by patient: Put head through opening Pull over shirt/dress - Perfomed by helper: Thread/unthread right sleeve, Thread/unthread left sleeve, Pull shirt over trunk        Upper body assist        Lower Body  Dressing/Undressing Lower body dressing Lower body dressing/undressing activity did not occur: Refused What is the patient wearing?: Pants, Socks, Shoes       Pants- Performed by helper: Thread/unthread right pants  leg, Thread/unthread left pants leg, Pull pants up/down       Socks - Performed by helper: Don/doff right sock, Don/doff left sock   Shoes - Performed by helper: Don/doff right shoe, Don/doff left shoe, Fasten right, Fasten left          Lower body assist        Toileting Toileting Toileting activity did not occur: Safety/medical concerns   Toileting steps completed by helper: Adjust clothing prior to toileting, Performs perineal hygiene, Adjust clothing after toileting    Toileting assist Assist level: Two helpers   Transfers Chair/bed transfer   Chair/bed transfer method: Squat pivot Chair/bed transfer assist level: 2 helpers Chair/bed transfer assistive device: Armrests     Locomotion Ambulation Ambulation activity did not occur: Safety/medical concerns         Wheelchair   Type: Manual   Assist Level: Dependent (Pt equals 0%)  Cognition Comprehension Comprehension assist level: Understands basic 90% of the time/cues < 10% of the time  Expression Expression assist level: Expresses basic 75 - 89% of the time/requires cueing 10 - 24% of the time. Needs helper to occlude trach/needs to repeat words.  Social Interaction Social Interaction assist level: Interacts appropriately 75 - 89% of the time - Needs redirection for appropriate language or to initiate interaction.  Problem Solving Problem solving assist level: Solves basic 50 - 74% of the time/requires cueing 25 - 49% of the time  Memory Memory assist level: Recognizes or recalls 50 - 74% of the time/requires cueing 25 - 49% of the time   Medical Problem List and Plan: 1. Right hemiparesis and cognitive deficits secondary to TBI and L-MCA infarcts.   -continue CIR therapies,PT, OT, SLP 2. DVT Prophylaxis/Anticoagulation: Pharmaceutical: Lovenox 3. Pain Management: Tylenol prn -right shoulder pain after fall. Has hx of RTC issues and prior surgery bilaterally -xrays on acute  negative -supporting RUE/shoulder as possible  -appears to have hyper sensitivity to movement on RIght side relalted to CVA/TBI, neurogenic 4. Mood: Team to provide ego support and positive reinforcement. Has supportive family.  5. Neuropsych: This patient is not fully capable of making decisions on her own behalf. 6. Skin/Wound Care: Routine pressure relief measures. Maintain adequate nutrition and hydration status. prevalon boots for heels 7. Fluids/Electrolytes/Nutrition: continue to encourage PO  -follow up labs as indicated.  8. HTN: Monitor BP tid - continue coreg bid and Catapres tid. 9. DM type 2: Hgb A1c- 7.5. Check blood sugars ac/hs. Use SSI for elevated BS   - CBG (last 3)   Recent Labs  03/13/16 1647 03/13/16 2150 03/14/16 0646  GLUCAP 122* 173* 131*     .  LOS (Days) 4 A FACE TO FACE EVALUATION WAS PERFORMED  Claudette LawsKIRSTEINS,Viyaan Champine E 03/14/2016 9:10 AM

## 2016-03-14 NOTE — Progress Notes (Signed)
Physical Therapy Session Note  Patient Details  Name: [name] Colon MRN: [id number] Date of Birth: [birth year]  Today's Date: 03/14/2016 PT Individual Time: 6004-5997 PT Individual Time Calculation (min): 40 min   Short Term Goals: Week 1:  PT Short Term Goal 1 (Week 1): Patient will maintain static sitting balance x 5 min with supervision.  PT Short Term Goal 2 (Week 1): Patient will recall pressure relief frequency and technique when up in TIS wheelchair with min cues.  PT Short Term Goal 3 (Week 1): Patient will initiate ambulation.  PT Short Term Goal 4 (Week 1): Patient will tolerate standing with use of equipment x 5 min.   Skilled Therapeutic Interventions/Progress Updates:    Pt received resting in w/c with family present.  No c/o pain and agreeable to therapy session.  Pt repositioned in w/c for improved midline orientation with total assist and max multimodal cues for forward weight shift.  Static standing in standing frame x8 min and x5 min focus on LE activation, upright posture, midline, and head control.  Pt tolerated well.  Pt positioned in w/c with towel wedge underneath cushion for improved midline orientation while resting in w/c.  Pt returned to room at end of session and requesting to remain upright in w/c.  Call bell in reach and needs met.   Therapy Documentation Precautions:  Precautions Precautions: Fall Precaution Comments: R hemiparesis Restrictions Weight Bearing Restrictions: No   See Function Navigator for Current Functional Status.   Therapy/Group: Individual Therapy  Earnest Conroy Penven-Crew 03/14/2016, 3:03 PM

## 2016-03-15 LAB — GLUCOSE, CAPILLARY
GLUCOSE-CAPILLARY: 131 mg/dL — AB (ref 65–99)
GLUCOSE-CAPILLARY: 160 mg/dL — AB (ref 65–99)
Glucose-Capillary: 157 mg/dL — ABNORMAL HIGH (ref 65–99)
Glucose-Capillary: 109 mg/dL — ABNORMAL HIGH (ref 65–99)

## 2016-03-15 NOTE — Progress Notes (Signed)
[place] PHYSICAL MEDICINE & REHABILITATION     PROGRESS NOTE    Subjective/Complaints: Pt without new issues, R arm is not painful  ROS: Pt denies fever, rash/itching, headache, blurred or double vision, nausea, vomiting, abdominal pain, diarrhea, chest pain, shortness of breath, palpitations, dysuria, dizziness,   bleeding, anxiety, or depression   Objective: Vital Signs: Blood pressure 155/49, pulse 66, temperature 99.1 F (37.3 C), temperature source Oral, resp. rate 17, height 5' (1.524 m), weight 60.7 kg (133 lb 13.1 oz), SpO2 96 %. No results found. No results for input(s): WBC, HGB, HCT, PLT in the last 72 hours. No results for input(s): NA, K, CL, GLUCOSE, BUN, CREATININE, CALCIUM in the last 72 hours.  Invalid input(s): CO CBG (last 3)   Recent Labs  03/14/16 1141 03/14/16 1658 03/15/16 0747  GLUCAP 154* 222* 157*    Wt Readings from Last 3 Encounters:  03/15/16 60.7 kg (133 lb 13.1 oz)  03/08/16 61.6 kg (135 lb 12.9 oz)  11/30/13 59.875 kg (132 lb)    Physical Exam:  Constitutional: She is oriented to person, place, and time. She appears well-developed and well-nourished.  HENT:  Head: Normocephalic.  Mouth/Throat: oral mucosa moist.  Eyes: Conjunctivae are normal. Pupils are equal, round, and reactive to light.  Neck: Normal range of motion. Neck supple.  Cardiovascular: Normal rate and regular rhythm.  Murmur heard. Respiratory: Effort normal and breath sounds normal. No respiratory distress. She exhibits no tenderness.  GI: Soft. Bowel sounds are normal. She exhibits no distension. There is no tenderness.  Musculoskeletal:      Right shoulder still tender with  PROM.  Neurological: She is alert and oriented to person, place, and time. A cranial nerve deficit is present.  Soft voice--improved verbal output. Does have fair insight and awareness.  Right inattention with right field cut. Right central 7. Dense right hemiparesis 0/5 in the right  upper extremity to lower extremity proximal to distal. Right hamstring with early tone while in supine.  Skin: Skin is warm and dry. Right heel with medial bruising/pressure area. Callus left heel Psychiatric: Her speech is delayed. She is slowed and withdrawn. She is inattentive.  Ext no swelling or erythema RLE. No calf tenderness   Assessment/Plan: 1. 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 Physiatrist is providing close team supervision and 24 hour management of active medical problems listed below. Physiatrist and rehab team continue to assess barriers to discharge/monitor patient progress toward functional and medical goals.  Function:  Bathing Bathing position Bathing activity did not occur: Refused Position: Bed  Bathing parts Body parts bathed by patient: Chest, Abdomen, Front perineal area, Left upper leg, Right arm, Right upper leg Body parts bathed by helper: Buttocks, Right lower leg, Left lower leg, Back, Left arm  Bathing assist Assist Level: Touching or steadying assistance(Pt > 75%)      Upper Body Dressing/Undressing Upper body dressing Upper body dressing/undressing activity did not occur: Refused What is the patient wearing?: Bra, Pull over shirt/dress Bra - Perfomed by patient: Thread/unthread right bra strap, Thread/unthread left bra strap Bra - Perfomed by helper: Hook/unhook bra (pull down sports bra) Pull over shirt/dress - Perfomed by patient: Put head through opening, Thread/unthread left sleeve Pull over shirt/dress - Perfomed by helper: Pull shirt over trunk, Thread/unthread right sleeve        Upper body assist  Lower Body Dressing/Undressing Lower body dressing Lower body dressing/undressing activity did not occur: Refused What is the patient wearing?: Pants, Socks, Shoes       Pants-  Performed by helper: Thread/unthread right pants leg, Thread/unthread left pants leg, Pull pants up/down       Socks - Performed by helper: Don/doff right sock, Don/doff left sock   Shoes - Performed by helper: Don/doff right shoe, Don/doff left shoe, Fasten right, Fasten left          Lower body assist        Toileting Toileting Toileting activity did not occur: Safety/medical concerns   Toileting steps completed by helper: Adjust clothing prior to toileting, Performs perineal hygiene, Adjust clothing after toileting    Toileting assist Assist level: Two helpers   Transfers Chair/bed transfer   Chair/bed transfer method: Squat pivot Chair/bed transfer assist level: 2 helpers Chair/bed transfer assistive device: Armrests     Locomotion Ambulation Ambulation activity did not occur: Safety/medical concerns         Wheelchair   Type: Manual   Assist Level: Dependent (Pt equals 0%)  Cognition Comprehension Comprehension assist level: Understands basic 90% of the time/cues < 10% of the time  Expression Expression assist level: Expresses basic 75 - 89% of the time/requires cueing 10 - 24% of the time. Needs helper to occlude trach/needs to repeat words.  Social Interaction Social Interaction assist level: Interacts appropriately 75 - 89% of the time - Needs redirection for appropriate language or to initiate interaction.  Problem Solving Problem solving assist level: Solves basic 50 - 74% of the time/requires cueing 25 - 49% of the time  Memory Memory assist level: Recognizes or recalls 50 - 74% of the time/requires cueing 25 - 49% of the time   Medical Problem List and Plan: 1. Right hemiparesis and cognitive deficits secondary to TBI and L-MCA infarcts.   -continue CIR therapies,PT, OT, SLP 2. DVT Prophylaxis/Anticoagulation: Pharmaceutical: Lovenox 3. Pain Management: Tylenol prn -right shoulder pain after fall. Has hx of RTC issues and prior surgery  bilaterally -xrays on acute negative -supporting RUE/shoulder as possible  -appears to have hyper sensitivity to movement on RIght side related to CVA/TBI, neurogenic 4. Mood: Team to provide ego support and positive reinforcement. Has supportive family.  5. Neuropsych: This patient is not fully capable of making decisions on her own behalf. 6. Skin/Wound Care: Routine pressure relief measures. Maintain adequate nutrition and hydration status. prevalon boots for heels 7. Fluids/Electrolytes/Nutrition: continue to encourage PO  -follow up labs as indicated.  8. HTN: Monitor BP tid - continue coreg bid and Catapres tid. 9. DM type 2: Hgb A1c- 7.5. Check blood sugars ac/hs. Use SSI for elevated BS   - CBG (last 3)   Recent Labs  03/14/16 1141 03/14/16 1658 03/15/16 0747  GLUCAP 154* 222* 157*     .  LOS (Days) 5 A FACE TO FACE EVALUATION WAS PERFORMED  KIRSTEINS,ANDREW E 03/15/2016 9:15 AM

## 2016-03-15 NOTE — Care Management Note (Signed)
[facility] Individual Statement of Services  Patient Name:  [name] Colon  Date:  03/13/2016  Welcome to the [facility].  Our goal is to provide you with an individualized program based on your diagnosis and situation, designed to meet your specific needs.  With this comprehensive rehabilitation program, you will be expected to participate in at least 3 hours of rehabilitation therapies Monday-Friday, with modified therapy programming on the weekends.  Your rehabilitation program will include the following services:  Physical Therapy (PT), Occupational Therapy (OT), Speech Therapy (ST), 24 hour per day rehabilitation nursing, Therapeutic Recreaction (TR), Neuropsychology, Case Management (Social Worker), Rehabilitation Medicine, Nutrition Services and Pharmacy Services  Weekly team conferences will be held on Tuesdays to discuss your progress.  Your Social Worker will talk with you frequently to get your input and to update you on team discussions.  Team conferences with you and your family in attendance may also be held.  Expected length of stay: 25-28 days  Overall anticipated outcome: moderate assistance  Depending on your progress and recovery, your program may change. Your Social Worker will coordinate services and will keep you informed of any changes. Your Social Worker's name and contact numbers are listed  below.  The following services may also be recommended but are not provided by the [facility]:   Driving Evaluations  Home Health Rehabiltiation Services  Outpatient Rehabilitation Services    Arrangements will be made to provide these services after discharge if needed.  Arrangements include referral to agencies that provide these services.  Your insurance has been verified to be:  Medicare and BCBS Your primary doctor is:  Dr. Mardelle MatteAndy  Pertinent information will be shared with your doctor and your insurance  company.  Social Worker:  PaulLucy Avalynne Diver, [place] [phone] or ([phone]) [phone]   Information discussed with and copy given to patient by: Amada JupiterHOYLE, Crimson Dubberly, 03/13/2016, 10:46 AM

## 2016-03-15 NOTE — Progress Notes (Signed)
Social Work  Social Work Assessment and Plan  Patient Details  Name: [name] Colon MRN: [id number] Date of Birth: 11/09/1920  Today's Date: 03/13/2016  Problem List:  Patient Active Problem List   Diagnosis Date Noted  . 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 Essential hypertension   . Thyroid activity decreased   . Diabetes mellitus type 2 in nonobese (HCC)   . Pulmonary hypertension (HCC)   . Chronic diastolic congestive heart failure (HCC)   . Cognitive deficit as late effect of traumatic brain injury (HCC)   . Traumatic brain injury (HCC)   . Multiple lacunar infarcts (HCC)   . Hypertensive crisis 03/06/2016  . SDH (subdural hematoma) (HCC) 03/06/2016  . Cerebral thrombosis with cerebral infarction 03/06/2016  . Bowel obstruction (HCC) 09/28/2013  . SIRS (systemic inflammatory response syndrome) (HCC) 09/25/2013  . Thrombocytopenia (HCC) 09/25/2013  . Hypothyroidism 09/25/2013  . Leukopenia 09/25/2013  . Poor venous access 09/25/2013  . HTN (hypertension) 09/25/2013  . ?? Wide-complex tachycardia 09/25/2013  . Dehydration with hyponatremia 09/25/2013  . Claudication (HCC) 08/21/2013  . Type 2 diabetes mellitus (HCC) 08/21/2013   Past Medical History:  Past Medical History  Diagnosis Date  . Hypertension   . Diabetes mellitus   . Thyroid disease   . Claudication [facility](HCC)    Past Surgical History:  Past Surgical History  Procedure Laterality Date  . Appendectomy    . Tonsillectomy    . Cataract extraction     Social History:  reports that she has never smoked. She does not have any smokeless tobacco history on file. She reports that she does not drink alcohol or use illicit drugs.  Family / Support Systems Marital Status: Widow/Widower How Long?: 20+ yrs Patient Roles: Parent Children: son, Patriciaann ClanRalph Diloreto [facility]([place]) @ (C) [phone] and daughter, Venita LickLaura Sanders ([place] @ (C) [phone] or [phone](W) [phone] Other Supports: privately hired  caregiver, Burna MortimerWanda Anticipated Caregiver: Rayna SextonRalph and hired caregivers  Ability/Limitations of Caregiver: Hired caregiver is a freind of family who they contacted since admit to help. Caregiver Availability: 24/7 Family Dynamics: Both children very involved and supportive. Daughter here from [place] until mid week but expects to be "back and forth" to [place] as much as she can (works p/t)  Social History Preferred language: English Religion: [employer] Cultural Background: NA Read: Yes Write: Yes Employment Status: Retired Date Retired/Disabled/Unemployed: "years ago" Fish farm managerLegal Hisotry/Current Legal Issues: None Guardian/Conservator: per MD, pt not yet fully capable of making decision on her own behalf.  Defer to adult children.   Abuse/Neglect Physical Abuse: Denies Verbal Abuse: Denies Sexual Abuse: Denies Exploitation of patient/patient's resources: Denies Self-Neglect: Denies  Emotional Status Pt's affect, behavior adn adjustment status: Pt very pleasand and completes assessment interview without much difficulty, however, voice is very soft.  She responds easily to daughter's humor.  She does not appear or endorse any emotional distress.  ADmit much fatigue from therapies but pleased with progress.  Will follow and monitor emotional adjustment - refer to neuropsychology as indicated. Recent Psychosocial Issues: None Pyschiatric History: None Substance Abuse History: None  Patient / Family Perceptions, Expectations & Goals Pt/Family understanding of illness & functional limitations: Pt and family with good understanding of her TBI and infarcts.  Good understanding of current functional limitations/ need for CIR. Premorbid pt/family roles/activities: Pt was completely independent, driving and managing her own household and finances. Anticipated changes in roles/activities/participation: Pt likely will need 24/7 assistance with family/ private caregiver vs SNF. Pt/family expectations/goals:  Pt  hopeful she can tolerate CIR program and be able to d/c home.  Community Resources [employer]: None Premorbid Home Care/DME Agencies: None Transportation available at discharge: yes Resource referrals recommended: Neuropsychology  Discharge Planning Living Arrangements: Alone Support Systems: Children, Other relatives, Home care staff, Psychologist, clinical community, Friends/neighbors Type of Residence: Private residence Insurance Resources: [employer], Media planner (specify) Herbalist) Financial Resources: Restaurant manager, fast food Screen Referred: No Living Expenses: Own Money Management: Patient Does the patient have any problems obtaining your medications?: No Home Management: pt Patient/Family Preliminary Plans: Pt and family agreed that plan will be for d/c home and private caregiver OR SNF  Social Work Anticipated Follow Up Needs: HH/OP, SNF Expected length of stay: 25-28 days  Clinical Impression Very pleasant, elderly woman here following a fall with TBI and infarcts.  Two adult children who are very supportive and all agreed on d/c plan of either home with private caregiver or to SNF.  Pt pleased with progress so far and is hopeful overall.  She denies any s/s of any emotional distress but will monitor.  Will follow for d/c planning and support needs.  Marea Reasner 03/13/2016, 10:42 AM

## 2016-03-16 ENCOUNTER — Inpatient Hospital Stay (HOSPITAL_COMMUNITY): Payer: Medicare Other | Admitting: Speech Pathology

## 2016-03-16 ENCOUNTER — Inpatient Hospital Stay (HOSPITAL_COMMUNITY): Payer: Medicare Other | Admitting: Physical Therapy

## 2016-03-16 ENCOUNTER — Inpatient Hospital Stay (HOSPITAL_COMMUNITY): Payer: Medicare Other

## 2016-03-16 LAB — GLUCOSE, CAPILLARY
GLUCOSE-CAPILLARY: 129 mg/dL — AB (ref 65–99)
Glucose-Capillary: 140 mg/dL — ABNORMAL HIGH (ref 65–99)
Glucose-Capillary: 191 mg/dL — ABNORMAL HIGH (ref 65–99)

## 2016-03-16 NOTE — Progress Notes (Signed)
Physical Therapy Session Note  Patient Details  Name: [name] Colon MRN: [id number] Date of Birth: 05/29/1920  Today's Date: 03/16/2016 PT Individual Time: 1310-1440 PT Individual Time Calculation (min): 90 min   Short Term Goals: Week 1:  PT Short Term Goal 1 (Week 1): Patient will maintain static sitting balance x 5 min with supervision.  PT Short Term Goal 2 (Week 1): Patient will recall pressure relief frequency and technique when up in TIS wheelchair with min cues.  PT Short Term Goal 3 (Week 1): Patient will initiate ambulation.  PT Short Term Goal 4 (Week 1): Patient will tolerate standing with use of equipment x 5 min.   Skilled Therapeutic Interventions/Progress Updates:   Session focused on R NMR, midline orientation, trunk/postural control, functional transfers, bed mobility, sitting balance, and activity tolerance. Performed standing frame x 12 min with functional reaching task to L to facilitate midline positioning and max verbal/visual cues for upright head with mirror for visual feedback with excellent tolerance to standing. Performed bed mobility with max-total A. Patient incontinent of urine, assisted with rolling to R and L using rails with max multimodal cues for sequencing and max A with improved participation from patient with total A for clothing management and patient able to perform perineal hygiene in R sidelying with setup assist.  Performed squat pivot transfers with +2A bed <> wheelchair <> BSC and wheelchair <> mat table and sit <> partial stand with [facility] from [facility] while helper performed clothing management. Sitting edge of mat with bolster under R hip to facilitate R lateral trunk shortening and L lateral trunk elongation, patient able to maintain static sitting balance with supervision up to 30 sec before requiring up to max A due to LOB posterior with no balance strategy noted. Patient reported feeling urge for bowel movement, able to sit on [facility] with supervision  approx 15 min but unable to void. Patient left sitting in TIS wheelchair with lap tray and RUE supported on pillow with call bell in reach and daughter in room at end of session.   Therapy Documentation Precautions:  Precautions Precautions: Fall Precaution Comments: R hemiparesis Restrictions Weight Bearing Restrictions: No Pain: Pain Assessment Pain Assessment: No/denies pain   See Function Navigator for Current Functional Status.   Therapy/Group: Individual Therapy  Kerney ElbeVarner, Lashawn Orrego A 03/16/2016, 2:52 PM

## 2016-03-16 NOTE — Progress Notes (Signed)
Occupational Therapy Session Note  Patient Details  Name: [name] Colon MRN: [id number] Date of Birth: 10/12/1920  Today's Date: 03/16/2016 OT Individual Time: 7829-56210700-0815 OT Individual Time Calculation (min): 75 min    Short Term Goals: Week 1:  OT Short Term Goal 1 (Week 1): Pt will perform toilet transfer with assist of 1 person in order to decrease level of assist with functional transfer. OT Short Term Goal 2 (Week 1): Pt will engage in 5 minutes of self care tasks with 2 or less rest breaks secondary to fatigue.  OT Short Term Goal 3 (Week 1): Pt will perform UB dressing with max A in order to decrease level of assist with self care.  OT Short Term Goal 4 (Week 1): Pt will perform grooming at sink with set up A in order to increase I in self care.   Skilled Therapeutic Interventions/Progress Updates:    Pt resting in bed upon arrival with R resting hand splint in place.  Pt agreeable to engaging in BADL retraining inlcuding LB bathing/dressing at bed level and UB bathing/dressing seated in w/c at sink. Pt initially exhibited a flat affect which improved during session with pt becoming more interactive. Pt required max A for rolling in bed to facilitate bathing buttocks and donning pants. Pt required max verbal cues to initiate and sequence bathing/dressing tasks this morning.  Pt required max a for supine>sit EOB and tot A + 2 for squat pivot transfer to w/c to complete UB bathing/dressing tasks. Pt required max multimodal cues for compensatory techniques/strategies for dressing tasks.  Pt's daughter entered room near end of session and observed the remainder of therapy session.  Focus on activity tolerance, bed mobility, functional transfers, compensatory techniques/strategies, sitting balance, task initiation/sequencing, and safety awareness to increase independence with BADLs.  Therapy Documentation Precautions:  Precautions Precautions: Fall Precaution Comments: R  hemiparesis Restrictions Weight Bearing Restrictions: No  Pain: Pt c/o tenderness in right wrist and hand with movement; pt grimaces with movement; MD aware and repositionedADL:    See Function Navigator for Current Functional Status.   Therapy/Group: Individual Therapy  Rich BraveLanier, Frady Taddeo Chappell 03/16/2016, 9:44 AM

## 2016-03-16 NOTE — Progress Notes (Signed)
Speech Language Pathology Daily Session Note  Patient Details  Name: [name] Colon MRN: [id number] Date of Birth: 05/18/1920  Today's Date: 03/16/2016 SLP Individual Time: 4098-11910837-0935 SLP Individual Time Calculation (min): 58 min  Short Term Goals: Week 1: SLP Short Term Goal 1 (Week 1): Patient will demonstrate sustained attention to functional tasks for 15 mintues with Min A verbal cues for redirection SLP Short Term Goal 2 (Week 1): Patient will attend to right field of enviornment during functional tasks with Min A verbal cues.  SLP Short Term Goal 3 (Week 1): Patient will identify 2 physical and 2 cognitive deficits with Mod A verbal and contextual cues.  SLP Short Term Goal 4 (Week 1): Patient will demonstrate functional problem solving for basic and familiar tasks with Min A verbal cues.  SLP Short Term Goal 5 (Week 1): Patient will recall 2 events from previous therapy sessions with supervision question cues.  SLP Short Term Goal 6 (Week 1): Patient will utilize an increased vocal intensity at the phrase level to achieve 90% intelligibility with supervision verbal cues.   Skilled Therapeutic Interventions: Skilled treatment session focused on addressing cognition goals. SLP facilitated session by providing Mod-Max assist to problem solve the accurate sequence of 4 step basic home management tasks via picture cards. Patient required Mod verbal cues to sustain attention to task for about 15 minutes and attend to the right of the table task.  Patient was able to describe scenes Mod I with increased time.  Continue with current plan of care.    Function:  Eating Eating   Modified Consistency Diet: No Eating Assist Level: More than reasonable amount of time;Set up assist for   Eating Set Up Assist For: Opening containers;Cutting food       Cognition Comprehension Comprehension assist level: Understands basic 50 - 74% of the time/ requires cueing 25 - 49% of the time  Expression    Expression assist level: Expresses basic 50 - 74% of the time/requires cueing 25 - 49% of the time. Needs to repeat parts of sentences.  Social Interaction Social Interaction assist level: Interacts appropriately 75 - 89% of the time - Needs redirection for appropriate language or to initiate interaction.  Problem Solving Problem solving assist level: Solves basic 50 - 74% of the time/requires cueing 25 - 49% of the time  Memory Memory assist level: Recognizes or recalls 50 - 74% of the time/requires cueing 25 - 49% of the time    Pain Pain Assessment Pain Assessment: No/denies pain  Therapy/Group: Individual Therapy  [name] Colon, M.A., CCC-SLP [phone]  [name] Colon 03/16/2016, 4:38 PM

## 2016-03-16 NOTE — Progress Notes (Signed)
[place] PHYSICAL MEDICINE & REHABILITATION     PROGRESS NOTE    Subjective/Complaints: Pt without new issues, R arm is tender with OT today. Described it as pins and needles at times. Generally feels tired.  ROS: Pt denies fever, rash/itching, headache, blurred or double vision, nausea, vomiting, abdominal pain, diarrhea, chest pain, shortness of breath, palpitations, dysuria, dizziness,   bleeding, anxiety, or depression   Objective: Vital Signs: Blood pressure 173/52, pulse 61, temperature 98.3 F (36.8 C), temperature source Oral, resp. rate 18, height 5' (1.524 m), weight 60.5 kg (133 lb 6.1 oz), SpO2 95 %. No results found. No results for input(s): WBC, HGB, HCT, PLT in the last 72 hours. No results for input(s): NA, K, CL, GLUCOSE, BUN, CREATININE, CALCIUM in the last 72 hours.  Invalid input(s): CO CBG (last 3)   Recent Labs  03/15/16 1649 03/15/16 2130 03/16/16 0642  GLUCAP 160* 109* 140*    Wt Readings from Last 3 Encounters:  03/16/16 60.5 kg (133 lb 6.1 oz)  03/08/16 61.6 kg (135 lb 12.9 oz)  11/30/13 59.875 kg (132 lb)    Physical Exam:  Constitutional: She is oriented to person, place, and time. She appears well-developed and well-nourished.  HENT:  Head: Normocephalic.  Mouth/Throat: oral mucosa moist.  Eyes: Conjunctivae are normal. Pupils are equal, round, and reactive to light.  Neck: Normal range of motion. Neck supple.  Cardiovascular: Normal rate and regular rhythm.  Murmur heard. Respiratory: Effort normal and breath sounds normal. No respiratory distress. She exhibits no tenderness.  GI: Soft. Bowel sounds are normal. She exhibits no distension. There is no tenderness.  Musculoskeletal:      Right shoulder still tender with  PROM. more generalized arm pain today Neurological: She is alert and oriented to person, place, and time. A cranial nerve deficit is present.  Soft voice--improved verbal output. Does have fair insight and  awareness.  Right inattention with right field cut. Right central 7. Dense right hemiparesis 0/5 in the right upper extremity to lower extremity proximal to distal. Right hamstring with early tone while in supine.  Skin: Skin is warm and dry. Right heel with medial bruising/pressure area. Callus left heel Psychiatric: Her speech is delayed. She is slowed and withdrawn. She is inattentive.  Ext no swelling or erythema RLE. No calf tenderness   Assessment/Plan: 1. 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 Physiatrist is providing close team supervision and 24 hour management of active medical problems listed below. Physiatrist and rehab team continue to assess barriers to discharge/monitor patient progress toward functional and medical goals.  Function:  Bathing Bathing position Bathing activity did not occur: Refused Position: Bed  Bathing parts Body parts bathed by patient: Chest, Abdomen, Front perineal area, Left upper leg, Right arm, Right upper leg Body parts bathed by helper: Buttocks, Right lower leg, Left lower leg, Back, Left arm  Bathing assist Assist Level: Touching or steadying assistance(Pt > 75%)      Upper Body Dressing/Undressing Upper body dressing Upper body dressing/undressing activity did not occur: Refused What is the patient wearing?: Bra, Pull over shirt/dress Bra - Perfomed by patient: Thread/unthread right bra strap, Thread/unthread left bra strap Bra - Perfomed by helper: Hook/unhook bra (pull down sports bra) Pull over shirt/dress - Perfomed by patient: Put head through opening, Thread/unthread left sleeve Pull over shirt/dress - Perfomed by helper: Pull shirt over  trunk, Thread/unthread right sleeve        Upper body assist        Lower Body Dressing/Undressing Lower body dressing Lower body  dressing/undressing activity did not occur: Refused What is the patient wearing?: Pants, Socks, Shoes       Pants- Performed by helper: Thread/unthread right pants leg, Thread/unthread left pants leg, Pull pants up/down       Socks - Performed by helper: Don/doff right sock, Don/doff left sock   Shoes - Performed by helper: Don/doff right shoe, Don/doff left shoe, Fasten right, Fasten left          Lower body assist        Toileting Toileting Toileting activity did not occur: Safety/medical concerns   Toileting steps completed by helper: Adjust clothing prior to toileting, Performs perineal hygiene, Adjust clothing after toileting    Toileting assist Assist level: Two helpers   Transfers Chair/bed transfer   Chair/bed transfer method: Squat pivot Chair/bed transfer assist level: 2 helpers Chair/bed transfer assistive device: Armrests     Locomotion Ambulation Ambulation activity did not occur: Safety/medical concerns         Wheelchair   Type: Manual   Assist Level: Dependent (Pt equals 0%)  Cognition Comprehension Comprehension assist level: Follows basic conversation/direction with no assist  Expression Expression assist level: Expresses basic 75 - 89% of the time/requires cueing 10 - 24% of the time. Needs helper to occlude trach/needs to repeat words.  Social Interaction Social Interaction assist level: Interacts appropriately 75 - 89% of the time - Needs redirection for appropriate language or to initiate interaction.  Problem Solving Problem solving assist level: Solves basic 50 - 74% of the time/requires cueing 25 - 49% of the time  Memory Memory assist level: Recognizes or recalls 50 - 74% of the time/requires cueing 25 - 49% of the time   Medical Problem List and Plan: 1. Right hemiparesis and cognitive deficits secondary to TBI and L-MCA infarcts.   -continue CIR therapies  -pt tolerating (3.5 hours per day) 2. DVT Prophylaxis/Anticoagulation:  Pharmaceutical: Lovenox 3. Pain Management: Tylenol prn -right shoulder pain after fall. Has hx of RTC issues and prior surgery bilaterally -xrays on acute negative -supporting RUE/shoulder as possible  -continue low dose gabapentin for neuropathic pain RUE (hesitate increasing much further) 4. Mood: Team to provide ego support and positive reinforcement. Has supportive family.  -pt denies depression and daughter agrees  5. Neuropsych: This patient is not fully capable of making decisions on her own behalf. 6. Skin/Wound Care: Routine pressure relief measures. Maintain adequate nutrition and hydration status. prevalon boots for heels 7. Fluids/Electrolytes/Nutrition: appetite has been good  -follow up labs as indicated.  8. HTN: Monitor BP tid - continue coreg bid and Catapres tid. 9. DM type 2: Hgb A1c- 7.5. Check blood sugars ac/hs. Use SSI for elevated BS   - CBG (last 3)   Recent Labs  03/15/16 1649 03/15/16 2130 03/16/16 0642  GLUCAP 160* 109* 140*     .  LOS (Days) 6 A FACE TO FACE EVALUATION WAS PERFORMED  SWARTZ,ZACHARY T 03/16/2016 9:17 AM

## 2016-03-17 ENCOUNTER — Inpatient Hospital Stay (HOSPITAL_COMMUNITY): Payer: Medicare Other | Admitting: *Deleted

## 2016-03-17 ENCOUNTER — Inpatient Hospital Stay (HOSPITAL_COMMUNITY): Payer: Medicare Other | Admitting: Speech Pathology

## 2016-03-17 LAB — GLUCOSE, CAPILLARY
GLUCOSE-CAPILLARY: 117 mg/dL — AB (ref 65–99)
GLUCOSE-CAPILLARY: 127 mg/dL — AB (ref 65–99)
GLUCOSE-CAPILLARY: 135 mg/dL — AB (ref 65–99)
GLUCOSE-CAPILLARY: 147 mg/dL — AB (ref 65–99)
Glucose-Capillary: 109 mg/dL — ABNORMAL HIGH (ref 65–99)

## 2016-03-17 NOTE — Progress Notes (Signed)
Occupational Therapy Session Note  Patient Details  Name: [name] Colon MRN: [id number] Date of Birth: 07/26/1920  Today's Date: 03/17/2016 OT Individual Time: 4098-11910815-0930 OT Individual Time Calculation (min): 75 min    Short Term Goals: Week 1:  OT Short Term Goal 1 (Week 1): Pt will perform toilet transfer with assist of 1 person in order to decrease level of assist with functional transfer. OT Short Term Goal 2 (Week 1): Pt will engage in 5 minutes of self care tasks with 2 or less rest breaks secondary to fatigue.  OT Short Term Goal 3 (Week 1): Pt will perform UB dressing with max A in order to decrease level of assist with self care.  OT Short Term Goal 4 (Week 1): Pt will perform grooming at sink with set up A in order to increase I in self care.   Skilled Therapeutic Interventions/Progress Updates:    Pt engaged in BADL retraining including LB bathing/dressing at bed level and UB bathing/dressing seated in w/c at sink.  Pt requires max A for rolling in bed to facilitate LB bathing and dressing tasks.  Pt initiates pulling pants over hips but requires max A to complete task.  Pt requires max A for supine>sit EOB and min A for sitting balance EOB with brief periods (10 seconds) at close supervision level.  Pt requires tot a + 2 (pt=10%) for squat pivot transfer to w/c.  Ongoing education for hemi bathing/dressing tasks initiated during session.  Pt completed UB bathing/dressing tasks without a rest break while seated in w/c.  Pt required assistance with brushing her hair this morning.  Focus on BADL retraining, bed mobility, sitting balance, functional transfers, task initiation, and safety awareness to increase independence with BADLs and decrease burden of care.   Therapy Documentation Precautions:  Precautions Precautions: Fall Precaution Comments: R hemiparesis Restrictions Weight Bearing Restrictions: No Pain:  Pt c/o increased pain/tenderness in R wrist and shoulder with  movement and when touched; RN aware and repositioned  See Function Navigator for Current Functional Status.   Therapy/Group: Individual Therapy  Rich BraveLanier, [name] 03/17/2016, 9:42 AM

## 2016-03-17 NOTE — Progress Notes (Signed)
Physical Therapy Session Note  Patient Details  Name: [name] Colon MRN: [id number] Date of Birth: 04/18/1920  Today's Date: 03/17/2016 PT Individual Time: 1300-1430 PT Individual Time Calculation (min): 90 min   Short Term Goals: Week 1:  PT Short Term Goal 1 (Week 1): Patient will maintain static sitting balance x 5 min with supervision.  PT Short Term Goal 2 (Week 1): Patient will recall pressure relief frequency and technique when up in TIS wheelchair with min cues.  PT Short Term Goal 3 (Week 1): Patient will initiate ambulation.  PT Short Term Goal 4 (Week 1): Patient will tolerate standing with use of equipment x 5 min.   Skilled Therapeutic Interventions/Progress Updates:   Skilled co-treatment with recreational therapist to address RLE NMR, LE activation, midline orientation, trunk/postural control, functional transfers, weight shifting, sitting balance, and activity tolerance. Performed standing frame x 18 min with functional task of applying lipstick with LUE and engaging in therapeutic conversation with focus on midline and maintaining upright posture/forward gaze. Performed squat pivot transfer wheelchair > mat with +2A, verbal cues for head hips relationship, and forward lean as patient tends to push to R with LUE. Performed static and dynamic sitting balance edge of mat with feet supported and initially with small wedge under R hip to facilitate R lateral trunk shortening and L lateral trunk elongation with functional reaching task using LUE to facilitate core activation with anterior weight shift with mod-max A to successfully complete task. Patient asking throughout session, "Why can't I hold my head up?" due to max multimodal cues throughout session for upright posture and forward gaze due to forward flexed posture, education provided on R side and trunk impairments and deconditioning/fatigue, patient verbalized understanding but motivated to continue working without rest breaks  despite fatigue. Patient instructed in sit <> stand via [place] with max-total A x 2 and performed perched sitting in [place] to work on trunk/postural control and upright head with mod-max A provided at L trunk/LUE supported for static > dynamic sitting balance in [place]. Mirror for visual feedback for standing frame and [place] tasks. [facility] and left sitting in wheelchair with full lap tray in place and call bell in lap.    Therapy Documentation Precautions:  Precautions Precautions: Fall Precaution Comments: R hemiparesis Restrictions Weight Bearing Restrictions: No Pain: Pain Assessment Pain Assessment: Faces Faces Pain Scale: Hurts little more Pain Type: Acute pain Pain Location: Shoulder Pain Orientation: Right Pain Descriptors / Indicators: Aching;Grimacing Pain Onset: With Activity Pain Intervention(s): Repositioned;Rest   See Function Navigator for Current Functional Status.   Therapy/Group: Individual Therapy  Kerney ElbeVarner, Anett Ranker A 03/17/2016, 2:43 PM

## 2016-03-17 NOTE — Progress Notes (Signed)
[name] Colon PHYSICAL MEDICINE & REHABILITATION     PROGRESS NOTE    Subjective/Complaints: Had a reasonable night. Right arm pain better. States she's tolerating therapy. [birth year]!  ROS: Pt denies fever, rash/itching, headache, blurred or double vision, nausea, vomiting, abdominal pain, diarrhea, chest pain, shortness of breath, palpitations, dysuria, dizziness,   bleeding, anxiety, or depression   Objective: Vital Signs: Blood pressure 141/52, pulse 71, temperature 98.1 F (36.7 C), temperature source Oral, resp. rate 18, height 5' (1.524 m), weight 60.4 kg (133 lb 2.5 oz), SpO2 94 %. No results found. No results for input(s): WBC, HGB, HCT, PLT in the last 72 hours. No results for input(s): NA, K, CL, GLUCOSE, BUN, CREATININE, CALCIUM in the last 72 hours.  Invalid input(s): CO CBG (last 3)   Recent Labs  03/16/16 1611 03/16/16 2201 03/17/16 0632  GLUCAP 129* 117* 127*    Wt Readings from Last 3 Encounters:  03/17/16 60.4 kg (133 lb 2.5 oz)  03/08/16 61.6 kg (135 lb 12.9 oz)  11/30/13 59.875 kg (132 lb)    Physical Exam:  Constitutional: She is oriented to person, place, and time. She appears well-developed and well-nourished.  HENT:  Head: Normocephalic.  Mouth/Throat: oral mucosa moist.  Eyes: Conjunctivae are normal. Pupils are equal, round, and reactive to light.  Neck: Normal range of motion. Neck supple.  Cardiovascular: Normal rate and regular rhythm.  Murmur heard. Respiratory: Effort normal and breath sounds normal. No respiratory distress. She exhibits no tenderness.  GI: Soft. Bowel sounds are normal. She exhibits no distension. There is no tenderness.  Musculoskeletal:      Right shoulder less tender today Neurological: She is alert and oriented to person, place, and time. A cranial nerve deficit is present.  Soft voice--improving verbal output. Does have fair insight and awareness.  Right inattention with right field cut. Right central  7. Dense right hemiparesis 0/5 in the right upper extremity to lower extremity proximal to distal. Right hamstring with early tone while in supine.  Skin: Skin is warm and dry. Right heel with medial bruising/pressure area. Callus left heel. Early blistering on sacrum Psychiatric: Her speech is delayed. She is slowed and withdrawn. She is inattentive.  Ext no swelling or erythema RLE. No calf tenderness   Assessment/Plan: 1. 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 Physiatrist is providing close team supervision and 24 hour management of active medical problems listed below. Physiatrist and rehab team continue to assess barriers to discharge/monitor patient progress toward functional and medical goals.  Function:  Bathing Bathing position Bathing activity did not occur: Refused Position: Bed  Bathing parts Body parts bathed by patient: Chest, Abdomen, Front perineal area, Right upper leg, Left upper leg, Right arm Body parts bathed by helper: Right lower leg, Left lower leg, Buttocks, Left arm  Bathing assist Assist Level:  (patient completed 5/10, 50%, moderate assist)      Upper Body Dressing/Undressing Upper body dressing Upper body dressing/undressing activity did not occur: Refused What is the patient wearing?: Bra, Pull over shirt/dress Bra - Perfomed by patient: Thread/unthread right bra strap, Thread/unthread left bra strap Bra - Perfomed by helper: Hook/unhook bra (pull down sports bra) Pull over shirt/dress - Perfomed by patient: Put head through opening Pull over shirt/dress - Perfomed by helper: Thread/unthread right sleeve, Thread/unthread left sleeve, Pull shirt over trunk  Upper body assist        Lower Body Dressing/Undressing Lower body dressing Lower body dressing/undressing activity did not occur:  Refused What is the patient wearing?: Pants, Socks, Shoes       Pants- Performed by helper: Thread/unthread right pants leg, Thread/unthread left pants leg, Pull pants up/down       Socks - Performed by helper: Don/doff right sock, Don/doff left sock   Shoes - Performed by helper: Don/doff right shoe, Don/doff left shoe, Fasten right, Fasten left          Lower body assist        Toileting Toileting Toileting activity did not occur: Safety/medical concerns   Toileting steps completed by helper: Adjust clothing prior to toileting, Performs perineal hygiene, Adjust clothing after toileting    Toileting assist Assist level: Two helpers, Set up/obtain supplies   Transfers Chair/bed transfer   Chair/bed transfer method: Squat pivot Chair/bed transfer assist level: 2 helpers Chair/bed transfer assistive device: Armrests     Locomotion Ambulation Ambulation activity did not occur: Safety/medical concerns         Wheelchair   Type: Manual   Assist Level: Dependent (Pt equals 0%)  Cognition Comprehension Comprehension assist level: Understands basic 50 - 74% of the time/ requires cueing 25 - 49% of the time  Expression Expression assist level: Expresses basic 50 - 74% of the time/requires cueing 25 - 49% of the time. Needs to repeat parts of sentences.  Social Interaction Social Interaction assist level: Interacts appropriately 75 - 89% of the time - Needs redirection for appropriate language or to initiate interaction.  Problem Solving Problem solving assist level: Solves basic 50 - 74% of the time/requires cueing 25 - 49% of the time  Memory Memory assist level: Recognizes or recalls 50 - 74% of the time/requires cueing 25 - 49% of the time   Medical Problem List and Plan: 1. Right hemiparesis and cognitive deficits secondary to TBI and L-MCA infarcts.   -continue CIR therapies  -pt tolerating (3.5 hours per day) 2. DVT Prophylaxis/Anticoagulation: Pharmaceutical:  Lovenox 3. Pain Management: Tylenol prn -right shoulder pain after fall. Has hx of RTC issues and prior surgery bilaterally -xrays on acute negative -supporting RUE/shoulder as possible  -continue low dose gabapentin for neuropathic pain RUE (hesitate increasing much further) 4. Mood: Team to provide ego support and positive reinforcement. Has supportive family.  -pt denies depression and daughter agrees  5. Neuropsych: This patient is not fully capable of making decisions on her own behalf. 6. Skin/Wound Care: Routine pressure relief measures. Maintain adequate nutrition and hydration status. prevalon boots for heels  -air mattress to relieve pressure on backside 7. Fluids/Electrolytes/Nutrition: appetite has been good  -follow up labs as indicated.  8. HTN: Monitor BP tid - continue coreg bid and Catap[place] tid. 9. DM type 2: Hgb A1c- 7.5. Check blood sugars ac/hs. Use SSI for elevated BS   - CBG (last 3)   Recent Labs  03/16/16 1611 03/16/16 2201 03/17/16 0632  GLUCAP 129* 117* 127*     .  LOS (Days) 7 A FACE TO FACE EVALUATION WAS PERFORMED  [name] Colon 03/17/2016 9:15 AM

## 2016-03-17 NOTE — Progress Notes (Signed)
Speech Language Pathology Daily Session Note  Patient Details  Name: [name] Colon MRN: [id number] Date of Birth: 10/03/1920  Today's Date: 03/17/2016 SLP Individual Time: 1000-1100 SLP Individual Time Calculation (min): 60 min  Short Term Goals: Week 1: SLP Short Term Goal 1 (Week 1): Patient will demonstrate sustained attention to functional tasks for 15 mintues with Min A verbal cues for redirection SLP Short Term Goal 2 (Week 1): Patient will attend to right field of enviornment during functional tasks with Min A verbal cues.  SLP Short Term Goal 3 (Week 1): Patient will identify 2 physical and 2 cognitive deficits with Mod A verbal and contextual cues.  SLP Short Term Goal 4 (Week 1): Patient will demonstrate functional problem solving for basic and familiar tasks with Min A verbal cues.  SLP Short Term Goal 5 (Week 1): Patient will recall 2 events from previous therapy sessions with supervision question cues.  SLP Short Term Goal 6 (Week 1): Patient will utilize an increased vocal intensity at the phrase level to achieve 90% intelligibility with supervision verbal cues.   Skilled Therapeutic Interventions: Skilled treatment session focused on cognitive goals. SLP facilitated session by providing Mod A verbal and question cues for selective attention, attention to right field of environment and problem solving with a novel, basic card game. Patient recalled events from previous therapy session with Mod I and independently educated her family member on how to appropriately donn her shirt (weak arm first). Patient left upright in wheelchair with all needs within reach and family present. Continue with current plan of care.    Function:  Cognition Comprehension Comprehension assist level: Understands basic 75 - 89% of the time/ requires cueing 10 - 24% of the time  Expression   Expression assist level: Expresses basic 75 - 89% of the time/requires cueing 10 - 24% of the time. Needs  helper to occlude trach/needs to repeat words.  Social Interaction Social Interaction assist level: Interacts appropriately 90% of the time - Needs monitoring or encouragement for participation or interaction.  Problem Solving Problem solving assist level: Solves basic 50 - 74% of the time/requires cueing 25 - 49% of the time  Memory Memory assist level: Recognizes or recalls 50 - 74% of the time/requires cueing 25 - 49% of the time    Pain Pain Assessment Pain Assessment: Faces Faces Pain Scale: Hurts little more Pain Type: Acute pain Pain Location: Shoulder Pain Orientation: Right Pain Descriptors / Indicators: Aching;Grimacing Pain Onset: With Activity Pain Intervention(s): Repositioned;Rest  Therapy/Group: Individual Therapy  [name] Colon 03/17/2016, 4:44 PM

## 2016-03-17 NOTE — Plan of Care (Signed)
Problem: RH BOWEL ELIMINATION Goal: RH STG MANAGE BOWEL WITH ASSISTANCE STG Manage Bowel with min Assistance.  Outcome: Not Progressing LBM 4/28. Refusing suppository

## 2016-03-18 ENCOUNTER — Inpatient Hospital Stay (HOSPITAL_COMMUNITY): Payer: Medicare Other | Admitting: Physical Therapy

## 2016-03-18 ENCOUNTER — Inpatient Hospital Stay (HOSPITAL_COMMUNITY): Payer: Medicare Other

## 2016-03-18 ENCOUNTER — Inpatient Hospital Stay (HOSPITAL_COMMUNITY): Payer: Medicare Other | Admitting: Speech Pathology

## 2016-03-18 LAB — GLUCOSE, CAPILLARY
GLUCOSE-CAPILLARY: 155 mg/dL — AB (ref 65–99)
GLUCOSE-CAPILLARY: 155 mg/dL — AB (ref 65–99)
GLUCOSE-CAPILLARY: 105 mg/dL — AB (ref 65–99)
Glucose-Capillary: 161 mg/dL — ABNORMAL HIGH (ref 65–99)

## 2016-03-18 MED ORDER — GABAPENTIN 100 MG PO CAPS
100.0000 mg | ORAL_CAPSULE | Freq: Two times a day (BID) | ORAL | Status: DC
  Administered 2016-03-18 – 2016-03-25 (×15): 100 mg via ORAL
  Filled 2016-03-18 (×15): qty 1

## 2016-03-18 NOTE — Progress Notes (Signed)
Physical Therapy Weekly Progress Note  Patient Details  Name: [name] Colon MRN: [id number] Date of Birth: 04-04-1920  Beginning of progress report period: Mar 18, 2016 End of progress report period: March 11, 2016  Today's Date: 03/18/2016 PT Individual Time: 1004-1107 PT Individual Time Calculation (min): 63 min   Patient has made slow progress and has met 1 of 4 short term goals.  Pt continues to require use of tilt in space w/c for dependent w/c mobility, positioning, postural control and pressure relief, +2 for transfers, dynamic sitting balance activities, and use of lift equipment for static standing activities.    Patient continues to demonstrate the following deficits: impaired activity tolerance, impaired cognition, initiation, R inattention, R hemiplegia, impaired postural control, impaired balance, gait and therefore will continue to benefit from skilled PT intervention to enhance overall performance with activity tolerance, balance, postural control, ability to compensate for deficits, functional use of  right upper extremity and right lower extremity, attention and awareness.  Patient progressing toward long term goals..  Continue plan of care.  PT Short Term Goals Week 1:  PT Short Term Goal 1 (Week 1): Patient will maintain static sitting balance x 5 min with supervision.  PT Short Term Goal 1 - Progress (Week 1): Progressing toward goal PT Short Term Goal 2 (Week 1): Patient will recall pressure relief frequency and technique when up in TIS wheelchair with min cues.  PT Short Term Goal 2 - Progress (Week 1): Not met PT Short Term Goal 3 (Week 1): Patient will initiate ambulation.  PT Short Term Goal 3 - Progress (Week 1): Not met PT Short Term Goal 4 (Week 1): Patient will tolerate standing with use of equipment x 5 min.  PT Short Term Goal 4 - Progress (Week 1): Met Week 2:  PT Short Term Goal 1 (Week 2): Pt will initate and perform 50% of bed mobility supine <>  sit PT Short Term Goal 2 (Week 2): Pt will perform bed <> chair transfers with one person max A PT Short Term Goal 3 (Week 2): Pt will maintain dynamic sitting balance and sustained attention to functional task with mod A x 10 minutes PT Short Term Goal 4 (Week 2): Pt will transition to regular manual w/c during therapy for w/c mobility training x 50' with mod-max A PT Short Term Goal 5 (Week 2): Pt will perform pre-gait and standing balance activities with max A of one person  Skilled Therapeutic Interventions/Progress Updates:   Pt received from OT; pt seated in tilt in space w/c performing bathing and dressing at sink level.  PT to continue B&D with focus on upper body washing, rinsing and drying with therapist providing mod-max A and max verbal cues for initiation, sequencing, sustained attention to task and R side of body, use of RUE to wash L side, and for dynamic sitting balance and postural control while reaching forwards out of BOS to sink.  Performed upper body dressing including bra and shirt with therapist providing max A and verbal cues for sequencing of donning.       Following completion of UB bathing and dressing therapist performed adjustment of pt's tilt in space seating system for optimal pelvic and trunk positioning, postural control and activation during functional tasks and pressure relief.  Following adjustment pt returned to room and left in tilted positioned with full lap tray in place, RUE supported, all items within reach and family member present to supervise.  Therapy Documentation Precautions:  Precautions Precautions:  Fall Precaution Comments: R hemiparesis Restrictions Weight Bearing Restrictions: No   See Function Navigator for Current Functional Status.  Therapy/Group: Individual Therapy  Raylene Everts [facility] 03/18/2016, 12:25 PM

## 2016-03-18 NOTE — Progress Notes (Signed)
Occupational Therapy Note  Patient Details  Name: [name] Colon XXXRives MRN: [id number] Date of Birth: 07/25/1920  Today's Date: 03/18/2016 OT Individual Time: 1300-1330 OT Individual Time Calculation (min): 30 min   Pt grimaced with movement of RUE, stating that her wrist and shoulder were painful; positioned and kinesio tape applied Individual therapy  Pt resting in w/c upon arrival and stated that she was "wet." Pt performed squat pivot transfer to bed (to pt's right) requiring tot A + 2 (pt=0%). Pt engaged in bed mobility, rolling to right and left, to facilitate doffing/donning pants and periarea hygiene.  Pt initiated bed mobility tasks but required max A to complete tasks when rolling to right.  Pt did not initiate rolling to left. Pt lethargic throughout process, keeping her eyes closed approx 50% of the time.   Lavone NeriLanier, Shatika Grinnell [facility] 03/18/2016, 1:48 PM

## 2016-03-18 NOTE — Progress Notes (Signed)
PHYSICAL MEDICINE & REHABILITATION     PROGRESS NOTE    Subjective/Complaints: Able to sleep. Still having RUE discomfort---sometimes wrist, sometimes shoulder. Pt stated that her entire arm hurts and that it's a pins and needles sensation  ROS: Pt denies fever, rash/itching, headache, blurred or double vision, nausea, vomiting, abdominal pain, diarrhea, chest pain, shortness of breath, palpitations, dysuria, dizziness,   bleeding, anxiety, or depression   Objective: Vital Signs: Blood pressure 188/58, pulse 62, temperature 99.8 F (37.7 C), temperature source Oral, resp. rate 16, height 5' (1.524 m), weight 54.432 kg (120 lb), SpO2 100 %. No results found. No results for input(s): WBC, HGB, HCT, PLT in the last 72 hours. No results for input(s): NA, K, CL, GLUCOSE, BUN, CREATININE, CALCIUM in the last 72 hours.  Invalid input(s): CO CBG (last 3)   Recent Labs  03/17/16 1735 03/17/16 2117 03/18/16 0657  GLUCAP 147* 109* 155*    Wt Readings from Last 3 Encounters:  03/18/16 54.432 kg (120 lb)  03/08/16 61.6 kg (135 lb 12.9 oz)  11/30/13 59.875 kg (132 lb)    Physical Exam:  Constitutional: She is oriented to person, place, and time. She appears well-developed and well-nourished.  HENT:  Head: Normocephalic.  Mouth/Throat: oral mucosa moist.  Eyes: Conjunctivae are normal. Pupils are equal, round, and reactive to light.  Neck: Normal range of motion. Neck supple.  Cardiovascular: Normal rate and regular rhythm.  Murmur heard. Respiratory: Effort normal and breath sounds normal. No respiratory distress. She exhibits no tenderness.  GI: Soft. Bowel sounds are normal. She exhibits no distension. There is no tenderness.  Musculoskeletal:      Right arm as a whole appears tender and hypersensitive to touch.  Neurological: She is alert and oriented to person, place, and time. A cranial nerve deficit is present.  Soft voice--improving verbal output. Does have  fair insight and awareness.  Right inattention with right field cut. Right central 7. Dense right hemiparesis 0/5 in the right upper extremity to lower extremity proximal to distal. Right hamstring with early tone while in supine.  Skin: Skin is warm and dry. Right heel with medial bruising/pressure area. Callus left heel. Early blistering on sacrum Psychiatric: Her speech is delayed. She is slowed and withdrawn. She is inattentive.  Ext no swelling or erythema RLE. No calf tenderness   Assessment/Plan: 1. 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 Physiatrist is providing close team supervision and 24 hour management of active medical problems listed below. Physiatrist and rehab team continue to assess barriers to discharge/monitor patient progress toward functional and medical goals.  Function:  Bathing Bathing position Bathing activity did not occur: Refused Position: Bed  Bathing parts Body parts bathed by patient: Chest, Abdomen, Right arm, Left upper leg, Front perineal area Body parts bathed by helper: Buttocks, Right upper leg, Right lower leg, Left lower leg  Bathing assist Assist Level:  (patient completed 5/10, 50%, moderate assist)      Upper Body Dressing/Undressing Upper body dressing Upper body dressing/undressing activity did not occur: Refused What is the patient wearing?: Bra, Pull over shirt/dress Bra - Perfomed by patient: Thread/unthread left bra strap Bra - Perfomed by helper: Thread/unthread right bra strap, Hook/unhook bra (pull down sports bra) Pull over shirt/dress - Perfomed by patient: Put head through opening Pull over shirt/dress - Perfomed by helper: Thread/unthread right sleeve, Thread/unthread left  sleeve, Pull shirt over trunk        Upper body assist        Lower Body Dressing/Undressing Lower body  dressing Lower body dressing/undressing activity did not occur: Refused What is the patient wearing?: Pants, Non-skid slipper socks       Pants- Performed by helper: Thread/unthread right pants leg, Thread/unthread left pants leg, Pull pants up/down   Non-skid slipper socks- Performed by helper: Don/doff right sock, Don/doff left sock   Socks - Performed by helper: Don/doff right sock, Don/doff left sock   Shoes - Performed by helper: Don/doff right shoe, Don/doff left shoe, Fasten right, Fasten left          Lower body assist Assist for lower body dressing: 2 Designer, multimedia activity did not occur: Safety/medical concerns   Toileting steps completed by helper: Adjust clothing prior to toileting, Performs perineal hygiene, Adjust clothing after toileting    Toileting assist Assist level: Two helpers, Set up/obtain supplies   Transfers Chair/bed transfer   Chair/bed transfer method: Squat pivot Chair/bed transfer assist level: 2 helpers Chair/bed transfer assistive device: Armrests     Locomotion Ambulation Ambulation activity did not occur: Safety/medical concerns         Wheelchair   Type: Manual   Assist Level: Dependent (Pt equals 0%)  Cognition Comprehension Comprehension assist level: Understands basic 50 - 74% of the time/ requires cueing 25 - 49% of the time  Expression Expression assist level: Expresses basic 50 - 74% of the time/requires cueing 25 - 49% of the time. Needs to repeat parts of sentences.  Social Interaction Social Interaction assist level: Interacts appropriately 75 - 89% of the time - Needs redirection for appropriate language or to initiate interaction.  Problem Solving Problem solving assist level: Solves basic 50 - 74% of the time/requires cueing 25 - 49% of the time  Memory Memory assist level: Recognizes or recalls 50 - 74% of the time/requires cueing 25 - 49% of the time   Medical Problem List and Plan: 1.  Right hemiparesis and cognitive deficits secondary to TBI and L-MCA infarcts.   -continue CIR therapies  -pt tolerating (3.5 hours per day) 2. DVT Prophylaxis/Anticoagulation: Pharmaceutical: Lovenox 3. Pain Management: Tylenol prn -right shoulder pain after fall. Has hx of RTC issues and prior surgery bilaterally -xrays on acute negative -supporting RUE/shoulder as possible/ ktape/ splinting  -will sl increase gabapentin to  bid observing closely for tolerance 4. Mood: Team to provide ego support and positive reinforcement. Has supportive family.  -pt denies depression and daughter agrees  5. Neuropsych: This patient is not fully capable of making decisions on her own behalf. 6. Skin/Wound Care: Routine pressure relief measures. Maintain adequate nutrition and hydration status. prevalon boots for heels  -air mattress to relieve pressure on backside 7. Fluids/Electrolytes/Nutrition: appetite has been good  -follow up labs as indicated.  8. HTN: Monitor BP tid - continue coreg bid and Catapres tid. 9. DM type 2: Hgb A1c- 7.5. Check blood sugars ac/hs. Use SSI for elevated BS   - CBG (last 3)   Recent Labs  03/17/16 1735 03/17/16 2117 03/18/16 0657  GLUCAP 147* 109* 155*     .  LOS (Days) 8 A FACE TO FACE EVALUATION WAS PERFORMED  Sage Hammill T 03/18/2016 9:11 AM

## 2016-03-18 NOTE — Plan of Care (Signed)
Problem: RH BOWEL ELIMINATION Goal: RH STG MANAGE BOWEL W/MEDICATION W/ASSISTANCE STG Manage Bowel with Medication with min Assistance.  Outcome: Progressing Suppository given; LBM 4/28

## 2016-03-18 NOTE — Patient Care Conference (Signed)
Inpatient RehabilitationTeam Conference and Plan of Care Update Date: 03/17/2016   Time: 2:40 PM    Patient Name: [name] Colon      Medical Record Number: [id number]  Date of Birth: 02/11/1920 Sex: Female         Room/Bed: 4W20C/4W20C-01 Payor Info: Payor: MEDICARE / Plan: MEDICARE PART A AND B / Product Type: *No Product type* /    Admitting Diagnosis: TBI  CVA  Admit Date/Time:  03/10/2016  5:37 PM Admission Comments: No comment available   Primary Diagnosis:  Traumatic brain injury [facility](HCC) Principal Problem: Traumatic brain injury [facility](HCC)  Patient Active Problem List   Diagnosis Date Noted  . 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 Essential hypertension   . Thyroid activity decreased   . Diabetes mellitus type 2 in nonobese (HCC)   . Pulmonary hypertension (HCC)   . Chronic diastolic congestive heart failure (HCC)   . Cognitive deficit as late effect of traumatic brain injury (HCC)   . Traumatic brain injury (HCC)   . Multiple lacunar infarcts (HCC)   . Hypertensive crisis 03/06/2016  . SDH (subdural hematoma) (HCC) 03/06/2016  . Cerebral thrombosis with cerebral infarction 03/06/2016  . Bowel obstruction (HCC) 09/28/2013  . SIRS (systemic inflammatory response syndrome) (HCC) 09/25/2013  . Thrombocytopenia (HCC) 09/25/2013  . Hypothyroidism 09/25/2013  . Leukopenia 09/25/2013  . Poor venous access 09/25/2013  . HTN (hypertension) 09/25/2013  . ?? Wide-complex tachycardia 09/25/2013  . Dehydration with hyponatremia 09/25/2013  . Claudication (HCC) 08/21/2013  . Type 2 diabetes mellitus (HCC) 08/21/2013    Expected Discharge Date: Expected Discharge Date: 03/31/16  Team Members Present: Physician leading conference: Dr. Faith RogueZachary Swartz Social Worker Present: Amada JupiterLucy Javyn Havlin, LCSW Nurse Present: Other (comment) Estelle Grumbles(Chesea Evans, RN) PT Present: Bayard Huggerebecca Varner, PT OT Present: Roney MansJennifer Smith, OT;Ardis Rowanom [name], COTA SLP Present: Feliberto Gottronourtney Payne, SLP PPS  Coordinator present : Tora DuckMarie Noel, RN, CRRN     Current Status/Progress Goal Weekly Team Focus  Medical   left MCA infarct, TBI. fatigues easily. RUE pain. neuro vs ortho source or both  improve activity tolerance and pain control  pain rx, nutrition, bp control   Bowel/Bladder   patient incont x2; LBM 4/28-refused bisocodyl suppository 5/1 on dayshift and nightshift  Min assist  Encouarge pt to call staff prior to voiding vs when wet to be changed   Swallow/Nutrition/ Hydration             ADL's   BADLs-max A/tot A; functional transfers-tot A + 2  mod A overall/w/c level  R NMR, activity tolerance, BADL retraining, sitting balance, functional transfers   Mobility   max-total A x 2, brief periods of supervision static sitting balance  mod A wheelchair level  R NMR, standing frame, activity tolerance, sitting balance, postural/trunk control, pt/family education   Communication   Min-Mod A  Mod I   increased vocal intensity   Safety/Cognition/ Behavioral Observations  Overall Mod A   Supervision   selective attention, attention to right, awareness, recall and problem solving    Pain   No complaints of pain  < 3 on 0-10 pain scale  continue monitoring patient for activity intolerance and treat pain as needed   Skin   RUE edema; Bilateral buttocks with serous blisters  Patient remain free from new infection/breakdown while on Rehab.   Appropriately monitor and treat blisters on buttocks    Rehab Goals Patient on target to meet rehab goals: Yes *See Care Plan and  progress notes for long and short-term goals.  Barriers to Discharge: age,stamina, RUE pain, significant deficits    Possible Resolutions to Barriers:  rx pain, pacing, adaptive techniques, family education    Discharge Planning/Teaching Needs:  Plan is home with family providing 24/7 assistance vs (probable) SNF  To be scheduled.   Team Discussion:  Fatigues easily but very willing to work.  OT to trial some kinesio  taping tomorrow for shoulder/ arm pain.  Mod  Assist goals still remain tentative and may be "lofty".  SW reporting family likely to pursue SNF.  Revisions to Treatment Plan:  None   Continued Need for Acute Rehabilitation Level of Care: The patient requires daily medical management by a physician with specialized training in physical medicine and rehabilitation for the following conditions: Daily direction of a multidisciplinary physical rehabilitation program to ensure safe treatment while eliciting the highest outcome that is of practical value to the patient.: Yes Daily medical management of patient stability for increased activity during participation in an intensive rehabilitation regime.: Yes Daily analysis of laboratory values and/or radiology reports with any subsequent need for medication adjustment of medical intervention for : Post surgical problems;Urological problems;Neurological problems  Jalan Bodi 03/18/2016, 11:00 AM

## 2016-03-18 NOTE — Progress Notes (Signed)
Occupational Therapy Session Note  Patient Details  Name: [name] Colon XXXRives MRN: [id number] Date of Birth: 02/26/1920  Today's Date: 03/18/2016 OT Individual Time: 0900-1000 OT Individual Time Calculation (min): 60 min    Short Term Goals: Week 1:  OT Short Term Goal 1 (Week 1): Pt will perform toilet transfer with assist of 1 person in order to decrease level of assist with functional transfer. OT Short Term Goal 2 (Week 1): Pt will engage in 5 minutes of self care tasks with 2 or less rest breaks secondary to fatigue.  OT Short Term Goal 3 (Week 1): Pt will perform UB dressing with max A in order to decrease level of assist with self care.  OT Short Term Goal 4 (Week 1): Pt will perform grooming at sink with set up A in order to increase I in self care.   Skilled Therapeutic Interventions/Progress Updates:    Pt resting in bed upon arrival.  Pt engaged in BADL retraining including LB bathing/dressing tasks at bed level.  Pt required max A for supine>sit EOB in preparation for squat pivot transfer to w/c.  Pt required max verbal cues for sequencing during transfers.  Pt required tot A + 2 (pt=10%) for transfer.  Pt stated she needed to use the bedpan and transferred to drop arm BSC with tot A + 2. Pt was unsuccessful on BSC and transferred back to w/c to complete UB bathing/dressing tasks.  Pt unable to complete UB tasks during OT session.  Pt seated in w/c when PT entered room to assist with tasks.  Pt continues to require max verbal cues for task initiation and sequencing.  Focus on bed mobility, activity tolerance, functional transfers, sitting balance, task initiation, sequencing, and safety awareness to increase independence with BADLs and decrease burden of care.   Therapy Documentation Precautions:  Precautions Precautions: Fall Precaution Comments: R hemiparesis Restrictions Weight Bearing Restrictions: No  Pain:  Pt c/o ongoing pain in RUE (shoulder and wrist) with increase  intensity with movement, RN/MD aware, repositioned  See Function Navigator for Current Functional Status.   Therapy/Group: Individual Therapy  Rich BraveLanier, Deanna Boehlke Chappell 03/18/2016, 12:09 PM

## 2016-03-18 NOTE — Progress Notes (Signed)
Occupational Therapy Weekly Progress Note  Patient Details  Name: [name] Colon MRN: [id number] Date of Birth: [birth year]  Beginning of progress report period: March 11, 2016 End of progress report period: Mar 18, 2016  Patient has met 1 of 4 short term goals.  Pt's progress with BADLs has been minimal this week.  Pt continues to require tot A/max A for bathing and dressing tasks.  Pt requires tot A + 2 for functional transfers.  Pt continues to require max verbal cues for task initiation and sequencing during functional tasks.  Pt requires more than a reasonable amount of time to complete all tasks with multiple rest breaks.   Patient continues to demonstrate the following deficits: dense R hemiplegia, severely impaired trunk control and midline awareness, R visual inattention, impaired memory and processing skills and therefore will continue to benefit from skilled OT intervention to enhance overall performance with BADL and Reduce care partner burden.  Patient progressing toward long term goals..  Continue plan of care.  OT Short Term Goals Week 1:  OT Short Term Goal 1 (Week 1): Pt will perform toilet transfer with assist of 1 person in order to decrease level of assist with functional transfer. OT Short Term Goal 1 - Progress (Week 1):  (tot A + 2) OT Short Term Goal 2 (Week 1): Pt will engage in 5 minutes of self care tasks with 2 or less rest breaks secondary to fatigue.  OT Short Term Goal 2 - Progress (Week 1): Met OT Short Term Goal 3 (Week 1): Pt will perform UB dressing with max A in order to decrease level of assist with self care.  OT Short Term Goal 3 - Progress (Week 1):  (tot A (pt=20%)) OT Short Term Goal 4 (Week 1):  (assistance for brushing teeth and brushing hair) OT Short Term Goal 4 - Progress (Week 1): Progressing toward goal Week 2:  OT Short Term Goal 1 (Week 2): pt will perform toilet/BSC transfer with assist of 1 person in order to decrease level of assist with  functional transfer OT Short Term Goal 2 (Week 2): Pt will perform UB dressing with max A in order to decrease level of assistance with self care OT Short Term Goal 3 (Week 2): Pt will perform grooming at sink with set up in order to increase independence with self care      Therapy Documentation Precautions:  Precautions Precautions: Fall Precaution Comments: R hemiparesis Restrictions Weight Bearing Restrictions: No    See Function Navigator for Current Functional Status.    Leotis Shames [facility] 03/18/2016, 3:12 PM

## 2016-03-18 NOTE — Progress Notes (Signed)
Speech Language Pathology Weekly Progress and Session Note  Patient Details  Name: [name] Colon MRN: [id number] Date of Birth: 1920-11-14  Beginning of progress report period: March 11, 2016 End of progress report period: Mar 18, 2016  Today's Date: 03/18/2016 SLP Individual Time: 1500-1540 SLP Individual Time Calculation (min): 40 min  Missed Time: 20 minutes due to fatigue   Short Term Goals: Week 1: SLP Short Term Goal 1 (Week 1): Patient will demonstrate sustained attention to functional tasks for 15 mintues with Min A verbal cues for redirection SLP Short Term Goal 1 - Progress (Week 1): Met SLP Short Term Goal 2 (Week 1): Patient will attend to right field of enviornment during functional tasks with Min A verbal cues.  SLP Short Term Goal 2 - Progress (Week 1): Not met SLP Short Term Goal 3 (Week 1): Patient will identify 2 physical and 2 cognitive deficits with Mod A verbal and contextual cues.  SLP Short Term Goal 3 - Progress (Week 1): Met SLP Short Term Goal 4 (Week 1): Patient will demonstrate functional problem solving for basic and familiar tasks with Min A verbal cues.  SLP Short Term Goal 4 - Progress (Week 1): Not met SLP Short Term Goal 5 (Week 1): Patient will recall 2 events from previous therapy sessions with supervision question cues.  SLP Short Term Goal 5 - Progress (Week 1): Not met SLP Short Term Goal 6 (Week 1): Patient will utilize an increased vocal intensity at the phrase level to achieve 90% intelligibility with supervision verbal cues.  SLP Short Term Goal 6 - Progress (Week 1): Not met    New Short Term Goals: Week 2: SLP Short Term Goal 1 (Week 2): Patient will utilize an increased vocal intensity at the phrase level to achieve 90% intelligibility with Min verbal cues.  SLP Short Term Goal 2 (Week 2): Patient will recall 2 events from previous therapy sessions with MIn question cues.  SLP Short Term Goal 3 (Week 2): Patient will identify 2 physical and  2 cognitive deficits with Min A verbal and contextual cues.  SLP Short Term Goal 4 (Week 2): Patient will demonstrate functional problem solving for basic and familiar tasks with Min A verbal cues.  SLP Short Term Goal 5 (Week 2): Patient will attend to right field of enviornment during functional tasks with Min A verbal cues.  SLP Short Term Goal 6 (Week 2): Patient will demonstrate sustained attention to functional tasks for 30 mintues with Min A verbal cues for redirection  Weekly Progress Updates: Patient has made minimal and inconsistent gains and has met 2 of 6 STG's this reporting period due to increased attention and intellectual awareness of deficits. Currently, patient requires overall Mod A multimodal cues to complete functional and familiar tasks safely in regards to attention, problem solving, recall and awareness. Patient also requires intermittent Min-Mod A verbal cues for utilization of an increased vocal intensity to maximize intelligibility at the phrase level to 75%. Patient continues to be limited by fatigue. Patient and family education is ongoing. Patient would benefit from continued skilled SLP intervention to maximize cognitive function and speech intelligibility in order to maximize her overall function indepndendence prior to discharge home.       Intensity: Minumum of 1-2 x/day, 30 to 90 minutes Frequency: 3 to 5 out of 7 days Duration/Length of Stay: 5/16 Treatment/Interventions: Cognitive remediation/compensation;Cueing hierarchy;Functional tasks;Patient/family education;Therapeutic Activities;Internal/external aids;Environmental controls;Speech/Language facilitation   Daily Session  Skilled Therapeutic Interventions:  Skilled treatment session  focused on cognitive goals. SLP facilitated session by initially providing Max A multimodal cues for arousal, however, as session continued patient required supervision verbal cues for sustained attention to a calendar task.  Patient also required supervision verbal cues for sequencing and attention to left field of environment with task. Patient requested to call a friend to thank her for flowers and required Max A verbal and visual cues along with extra time to navigate phone in order to complete task. Patient also required Mod A verbal cues for use of an increased vocal intensity at the phrase level to achieve ~75% intelligibility at the phrase level. This was the second attempt for this clinician to see patient due to fatigue, however, patient missed 20 minutes due to continued fatigue at end of session with constant cueing needed to keep her eyes open. Patient left upright in bed with all needs within reach. COntinue with current plan of care.       Function:   Cognition Comprehension Comprehension assist level: Understands basic 75 - 89% of the time/ requires cueing 10 - 24% of the time  Expression   Expression assist level: Expresses basic 50 - 74% of the time/requires cueing 25 - 49% of the time. Needs to repeat parts of sentences.  Social Interaction Social Interaction assist level: Interacts appropriately 75 - 89% of the time - Needs redirection for appropriate language or to initiate interaction.  Problem Solving Problem solving assist level: Solves basic 50 - 74% of the time/requires cueing 25 - 49% of the time  Memory Memory assist level: Recognizes or recalls 50 - 74% of the time/requires cueing 25 - 49% of the time   Pain No/Denies Pain   Therapy/Group: Individual Therapy  Rosemary Pentecost 03/18/2016, 4:30 PM

## 2016-03-19 ENCOUNTER — Inpatient Hospital Stay (HOSPITAL_COMMUNITY): Payer: Medicare Other | Admitting: Physical Therapy

## 2016-03-19 ENCOUNTER — Inpatient Hospital Stay (HOSPITAL_COMMUNITY): Payer: Medicare Other | Admitting: Speech Pathology

## 2016-03-19 ENCOUNTER — Inpatient Hospital Stay (HOSPITAL_COMMUNITY): Payer: Medicare Other | Admitting: *Deleted

## 2016-03-19 LAB — URINALYSIS, ROUTINE W REFLEX MICROSCOPIC
BILIRUBIN URINE: NEGATIVE
GLUCOSE, UA: NEGATIVE mg/dL
KETONES UR: NEGATIVE mg/dL
Nitrite: POSITIVE — AB
PROTEIN: 100 mg/dL — AB
Specific Gravity, Urine: 1.02 (ref 1.005–1.030)
pH: 5.5 (ref 5.0–8.0)

## 2016-03-19 LAB — BASIC METABOLIC PANEL
Anion gap: 11 (ref 5–15)
BUN: 17 mg/dL (ref 6–20)
CALCIUM: 8.9 mg/dL (ref 8.9–10.3)
CO2: 20 mmol/L — AB (ref 22–32)
CREATININE: 0.79 mg/dL (ref 0.44–1.00)
Chloride: 106 mmol/L (ref 101–111)
GFR calc Af Amer: >60 mL/min (ref >60)
GFR calc non Af Amer: >60 mL/min (ref >60)
Glucose, Bld: 175 mg/dL — ABNORMAL HIGH (ref 65–99)
Potassium: 4.3 mmol/L (ref 3.5–5.1)
SODIUM: 137 mmol/L (ref 135–145)

## 2016-03-19 LAB — GLUCOSE, CAPILLARY
GLUCOSE-CAPILLARY: 177 mg/dL — AB (ref 65–99)
Glucose-Capillary: 128 mg/dL — ABNORMAL HIGH (ref 65–99)
Glucose-Capillary: 118 mg/dL — ABNORMAL HIGH (ref 65–99)
Glucose-Capillary: 170 mg/dL — ABNORMAL HIGH (ref 65–99)

## 2016-03-19 LAB — CBC
HCT: 39.6 % (ref 36.0–46.0)
Hemoglobin: 13 g/dL (ref 12.0–15.0)
MCH: 30.3 pg (ref 26.0–34.0)
MCHC: 32.8 g/dL (ref 30.0–36.0)
MCV: 92.3 fL (ref 78.0–100.0)
PLATELETS: 276 10*3/uL (ref 150–400)
RBC: 4.29 MIL/uL (ref 3.87–5.11)
RDW: 13.3 % (ref 11.5–15.5)
WBC: 4.8 10*3/uL (ref 4.0–10.5)

## 2016-03-19 LAB — URINE MICROSCOPIC-ADD ON

## 2016-03-19 MED ORDER — TRAMADOL HCL 50 MG PO TABS
25.0000 mg | ORAL_TABLET | Freq: Four times a day (QID) | ORAL | Status: DC | PRN
  Administered 2016-03-19 – 2016-03-23 (×4): 25 mg via ORAL
  Filled 2016-03-19 (×4): qty 1

## 2016-03-19 NOTE — Progress Notes (Signed)
Initial Nutrition Assessment  DOCUMENTATION CODES:   Not applicable  INTERVENTION:  Continue carbohydrate modified diet.  Encourage adequate PO intake.   NUTRITION DIAGNOSIS:   Increased nutrient needs related to  (therapy) as evidenced by estimated needs.  GOAL:   Patient will meet greater than or equal to 90% of their needs  MONITOR:   PO intake, Weight trends, Labs, I & O's, Skin  REASON FOR ASSESSMENT:   Low Braden    ASSESSMENT:   82 y.o. female with history of HTN, DM who fell and struck her head with LOC about 10 seconds and was noted to have MS changes on 03/05/16. She was evaluated in ED and CT head negative but BP elevated with SBP > 250. She was started on IV labetalol and was found to flaccid right hemiparesis. Repeat CT head showed small acute Left cerebellar and left posterior temporal lobe SDH with moderate right scalp hematoma and moderate to severe small vessel disease.  Pt identified as having a low braden score which can put pt at risk for skin breakdown. Pt reports eating well with no other difficulties. Meal completion has been 80-100%. Per Epic weight records, weight has been stable. Pt encouraged to eat her food at meals.   Nutrition-Focused physical exam completed. Findings are no fat depletion, mild to moderate muscle depletion, and mild edema. Depletion likely related to the natural aging process  Labs and medications reviewed.   Diet Order:  Diet Carb Modified Fluid consistency:: Thin; Room service appropriate?: Yes  Skin:  Wound (see comment) (DTI on heels)  Last BM:  5/2  Height:   Ht Readings from Last 1 Encounters:  03/10/16 5' (1.524 m)    Weight:   Wt Readings from Last 1 Encounters:  03/19/16 130 lb (58.968 kg)    Ideal Body Weight:  45.45 kg  BMI:  Body mass index is 25.39 kg/(m^2).  Estimated Nutritional Needs:   Kcal:  1450-1650  Protein:  60-70 grams  Fluid:  >/= 1.5 L/day  EDUCATION NEEDS:   No education  needs identified at this time  Roslyn SmilingStephanie Aza Dantes, MS, RD, LDN Pager # [phone] After hours/ weekend pager # [phone]

## 2016-03-19 NOTE — Progress Notes (Signed)
Occupational Therapy Session Note  Patient Details  Name: Ivar Drapelizabeth M Balbach MRN: [id number] Date of Birth: 08/11/1920  Today's Date: 03/19/2016 OT Individual Time: 0930-1030 OT Individual Time Calculation (min): 60 min    Short Term Goals: Week 2:  OT Short Term Goal 1 (Week 2): pt will perform toilet/BSC transfer with assist of 1 person in order to decrease level of assist with functional transfer OT Short Term Goal 2 (Week 2): Pt will perform UB dressing with max A in order to decrease level of assistance with self care OT Short Term Goal 3 (Week 2): Pt will perform grooming at sink with set up in order to increase independence with self care  Skilled Therapeutic Interventions/Progress Updates:    Pt resting in bed upon arrival.  Pt initially engaged in LB dressing at bed level.  Pt requires tot A for LB dressing at bed level.  Pt required max A for supine>sit EOB in preparation for squat pivot transfer to w/c.  Pt required max A for sitting balance and max verbal cues to correct posterior lean.  Pt required tot A + 2 with max verbal cues for squat pivot transfers.  Pt did not initiate assistance with transfer and attempted to lean posteriorly during transfer.  Pt engaged in UB bathing/dressing tasks required mod A for UB bathing and max A for UB dressing tasks.  Pt was able to brush hair without assistance.  Pt remained in w/c with all needs within reach.  Focus on bed mobility, activity tolerance, functional transfers, task initiation, sequencing, sitting balance, and safety awareness to increase independence with BADLs and decrease burden of care.   Therapy Documentation Precautions:  Precautions Precautions: Fall Precaution Comments: R hemiparesis Restrictions Weight Bearing Restrictions: No  Pain:  Pt c/o increased pain in right shoulder with movement   See Function Navigator for Current Functional Status.   Therapy/Group: Individual Therapy  Rich BraveLanier, Graham Hyun Chappell 03/19/2016,  10:35 AM

## 2016-03-19 NOTE — Progress Notes (Signed)
[place] PHYSICAL MEDICINE & REHABILITATION     PROGRESS NOTE    Subjective/Complaints: Slower yesterday and today. Fair appetite. Right arm feels better this morning but still sensitive.   ROS: Pt denies fever, rash/itching, headache, blurred or double vision, nausea, vomiting, abdominal pain, diarrhea, chest pain, shortness of breath, palpitations, dysuria, dizziness,   bleeding, anxiety, or depression   Objective: Vital Signs: Blood pressure 180/58, pulse 71, temperature 98.5 F (36.9 C), temperature source Oral, resp. rate 20, height 5' (1.524 m), weight 58.968 kg (130 lb), SpO2 98 %. No results found. No results for input(s): WBC, HGB, HCT, PLT in the last 72 hours. No results for input(s): NA, K, CL, GLUCOSE, BUN, CREATININE, CALCIUM in the last 72 hours.  Invalid input(s): CO CBG (last 3)   Recent Labs  03/18/16 1728 03/18/16 2124 03/19/16 0644  GLUCAP 105* 161* 128*    Wt Readings from Last 3 Encounters:  03/19/16 58.968 kg (130 lb)  03/08/16 61.6 kg (135 lb 12.9 oz)  11/30/13 59.875 kg (132 lb)    Physical Exam:  Constitutional: She is oriented to person, place, and time. She appears well-developed and well-nourished.  HENT:  Head: Normocephalic.  Mouth/Throat: oral mucosa moist.  Eyes: Conjunctivae are normal. Pupils are equal, round, and reactive to light.  Neck: Normal range of motion. Neck supple.  Cardiovascular: Normal rate and regular rhythm.  Murmur heard. Respiratory: Effort normal and breath sounds normal. No respiratory distress. She exhibits no tenderness.  GI: Soft. Bowel sounds are normal. She exhibits no distension. There is no tenderness.  Musculoskeletal:      Right arm as a whole appears tender and hypersensitive to touch (decreased today)---right leg too?Marland Kitchen.  Neurological: She is alert and oriented to person, place, and time. A cranial nerve deficit is present.  Soft voice--decreased verbal output.    Right inattention with right  field cut. Right central 7. Dense right hemiparesis 0/5 in the right upper extremity to lower extremity proximal to distal. Right hamstring with early tone while in supine.  Skin: Skin is warm and dry. Right heel with medial bruising/pressure area. Callus left heel. Early blistering on sacrum Psychiatric: Her speech is delayed. She is slowed and withdrawn. She is inattentive.  Ext no swelling or erythema RLE. No calf tenderness   Assessment/Plan: 1. 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 Physiatrist is providing close team supervision and 24 hour management of active medical problems listed below. Physiatrist and rehab team continue to assess barriers to discharge/monitor patient progress toward functional and medical goals.  Function:  Bathing Bathing position Bathing activity did not occur: Refused Position: Bed  Bathing parts Body parts bathed by patient: Chest, Abdomen, Right arm, Left upper leg, Front perineal area Body parts bathed by helper: Buttocks, Right upper leg, Right lower leg, Left lower leg, Left arm  Bathing assist Assist Level: More than reasonable time      Upper Body Dressing/Undressing Upper body dressing Upper body dressing/undressing activity did not occur: Refused What is the patient wearing?: Bra, Pull over shirt/dress Bra - Perfomed by patient: Thread/unthread left bra strap Bra - Perfomed by helper: Thread/unthread right bra strap, Hook/unhook bra (pull down sports bra) Pull over shirt/dress - Perfomed by patient: Put head through opening Pull over shirt/dress - Perfomed by helper: Thread/unthread right sleeve, Thread/unthread left sleeve, Pull shirt over trunk  Upper body assist        Lower Body Dressing/Undressing Lower body dressing Lower body dressing/undressing activity did not occur:  Refused What is the patient wearing?: Pants, Non-skid slipper socks       Pants- Performed by helper: Thread/unthread right pants leg, Thread/unthread left pants leg, Pull pants up/down   Non-skid slipper socks- Performed by helper: Don/doff right sock, Don/doff left sock   Socks - Performed by helper: Don/doff right sock, Don/doff left sock   Shoes - Performed by helper: Don/doff right shoe, Don/doff left shoe, Fasten right, Fasten left          Lower body assist Assist for lower body dressing: 2 Designer, multimedia activity did not occur: Safety/medical concerns   Toileting steps completed by helper: Adjust clothing prior to toileting, Performs perineal hygiene, Adjust clothing after toileting    Toileting assist Assist level: Two helpers, Set up/obtain supplies   Transfers Chair/bed transfer   Chair/bed transfer method: Squat pivot Chair/bed transfer assist level: 2 helpers Chair/bed transfer assistive device: Armrests     Locomotion Ambulation Ambulation activity did not occur: Safety/medical concerns         Wheelchair   Type: Manual   Assist Level: Dependent (Pt equals 0%)  Cognition Comprehension Comprehension assist level: Understands basic 90% of the time/cues < 10% of the time  Expression Expression assist level: Expresses basic 90% of the time/requires cueing < 10% of the time.  Social Interaction Social Interaction assist level: Interacts appropriately 90% of the time - Needs monitoring or encouragement for participation or interaction.  Problem Solving Problem solving assist level: Solves complex 90% of the time/cues < 10% of the time  Memory Memory assist level: Recognizes or recalls 90% of the time/requires cueing < 10% of the time   Medical Problem List and Plan: 1. Right hemiparesis and cognitive deficits secondary to TBI and L-MCA infarcts.   -continue CIR therapies  -  (3.5 hours per day) 2. DVT  Prophylaxis/Anticoagulation: Pharmaceutical: Lovenox 3. Pain Management: Tylenol prn -right shoulder pain after fall. Has hx of RTC issues and prior surgery bilaterally -xrays on acute negative -supporting RUE/shoulder as possible/ ktape/ splinting  -continue sl increase gabapentin   bid observing closely for tolerance 4. Mood: Team to provide ego support and positive reinforcement. Has supportive family.  -pt denies depression and daughter agrees  5. Neuropsych: This patient is not fully capable of making decisions on her own behalf. 6. Skin/Wound Care: Routine pressure relief measures. Maintain adequate nutrition and hydration status. prevalon boots for heels  -air mattress to relieve pressure on backside 7. Fluids/Electrolytes/Nutrition: appetite has been good  -follow up labs .  8. HTN: Monitor BP tid - continue coreg bid and Catapres tid. 9. DM type 2: Hgb A1c- 7.5. Check blood sugars ac/hs. Use SSI for elevated BS  10. Increased lethargy: re-check labs today. Urine also cloudy---check ua ucx  -may have to back off gabapentin.  - CBG (last 3)   Recent Labs  03/18/16 1728 03/18/16 2124 03/19/16 0644  GLUCAP 105* 161* 128*     .  LOS (Days) 9 A FACE TO FACE EVALUATION WAS PERFORMED  SWARTZ,ZACHARY T 03/19/2016 9:00 AM

## 2016-03-19 NOTE — Progress Notes (Signed)
Physical Therapy Session Note  Patient Details  Name: [name] Colon MRN: [id number] Date of Birth: 01/11/1920  Today's Date: 03/19/2016 PT Individual Time: 1530-1615 PT Individual Time Calculation (min): 45 min   Short Term Goals: Week 2:  PT Short Term Goal 1 (Week 2): Pt will initate and perform 50% of bed mobility supine <> sit PT Short Term Goal 2 (Week 2): Pt will perform bed <> chair transfers with one person max A PT Short Term Goal 3 (Week 2): Pt will maintain dynamic sitting balance and sustained attention to functional task with mod A x 10 minutes PT Short Term Goal 4 (Week 2): Pt will transition to regular manual w/c during therapy for w/c mobility training x 50' with mod-max A PT Short Term Goal 5 (Week 2): Pt will perform pre-gait and standing balance activities with max A of one person  Skilled Therapeutic Interventions/Progress Updates:   Patient asleep upon arrival, easily aroused to verbal cues and grimacing with report of stomach pain. [facility] and +2 for safety, able to urinate and required total A for partial stands x 3 from therapist while rehab tech performed hygiene and clothing management with total A. [facility]. Patient reports enjoying card games but when asked to play, she was unable to recall name or rules of any card game. In full upright position with BLE supported to challenge sitting balance and core activation, patient worked on reaching slightly outside BOS in all directions with LUE for cards and engaged in basic card game to facilitate anterior/lateral weight shifts and to lift back off wheelchair support. Patient required assistance to initiate weight shift to come forward off back of wheelchair but once in forward position, she was able to maintain unsupported sitting balance x 15-45 sec. Patient verbalized frustration that she was unable to complete task independently, emotional support and  encouragement provided. Patient left sitting in TIS wheelchair with RUE supported and call bell in reach.    Therapy Documentation Precautions:  Precautions Precautions: Fall Precaution Comments: R hemiparesis Restrictions Weight Bearing Restrictions: No Pain: Pain Assessment Pain Assessment: Faces Faces Pain Scale: Hurts whole lot Pain Type: Acute pain Pain Location: Shoulder Pain Orientation: Right Pain Descriptors / Indicators: Aching;Grimacing Pain Onset: With Activity Pain Intervention(s): Repositioned;Rest   See Function Navigator for Current Functional Status.   Therapy/Group: Individual Therapy  Kerney ElbeVarner, Saanvi Hakala A 03/19/2016, 4:34 PM

## 2016-03-19 NOTE — Progress Notes (Signed)
Speech Language Pathology Daily Session Notes  Patient Details  Name: [name] Colon MRN: [id number] Date of Birth: [birth year]  Today's Date: 03/19/2016  Session 1: SLP Individual Time: 0800-0900 SLP Individual Time Calculation (min): 60 min   Session 2: SLP Individual Time: 1030-1100 SLP Individual Time Calculation (min): 30 min  Short Term Goals: Week 2: SLP Short Term Goal 1 (Week 2): Patient will utilize an increased vocal intensity at the phrase level to achieve 90% intelligibility with Min verbal cues.  SLP Short Term Goal 2 (Week 2): Patient will recall 2 events from previous therapy sessions with MIn question cues.  SLP Short Term Goal 3 (Week 2): Patient will identify 2 physical and 2 cognitive deficits with Min A verbal and contextual cues.  SLP Short Term Goal 4 (Week 2): Patient will demonstrate functional problem solving for basic and familiar tasks with Min A verbal cues.  SLP Short Term Goal 5 (Week 2): Patient will attend to right field of enviornment during functional tasks with Min A verbal cues.  SLP Short Term Goal 6 (Week 2): Patient will demonstrate sustained attention to functional tasks for 30 mintues with Min A verbal cues for redirection  Skilled Therapeutic Interventions:  Session 1: Skilled treatment session focused on cognitive goals. Upon arrival, patient was awake but slow to arouse. However, with additional time, patient started engaging in conversation. SLP facilitated session by providing tray set-up with breakfast meal. Patient demonstrated alternating attention between functional conversation and self-feeding for ~20 minutes with Mod I and attended to right field of environment to locate items on tray with Min A verbal cues. Patient also demonstrated problem solving with self-feeding with Min A verbal and question cues. Patient independently requested to use the bedpan and was continent of urine. Patient was ~90% intelligible with supervision verbal cues  needed for use of an increased vocal intensity at the phrase and sentence level. Patient left upright in bed with all needs within reach. Continue with current plan of care.   Session 2: Skilled treatment session focused on cognitive and speech goals. Upon arrival, patient was awake while sitting upright in the wheelchair. SLP facilitated session by providing Mod A verbal and visual cues to self-monitor and correct verbal errors during a decoding task of reading the newspaper. Patient attended to right field of environment while reading with Mod I and required Min-Mod A verbal cues for use of an increased vocal intensity to maximize intelligibility to ~75-90% at the sentence level. Patient left upright in chair with all needs within reach. Continue with current plan of care.    Function:  Eating Eating   Modified Consistency Diet: No Eating Assist Level: More than reasonable amount of time;Set up assist for   Eating Set Up Assist For: Opening containers;Cutting food       Cognition Comprehension Comprehension assist level: Understands basic 90% of the time/cues < 10% of the time  Expression   Expression assist level: Expresses basic 75 - 89% of the time/requires cueing 10 - 24% of the time. Needs helper to occlude trach/needs to repeat words.  Social Interaction Social Interaction assist level: Interacts appropriately 90% of the time - Needs monitoring or encouragement for participation or interaction.  Problem Solving Problem solving assist level: Solves basic 75 - 89% of the time/requires cueing 10 - 24% of the time  Memory Memory assist level: Recognizes or recalls 75 - 89% of the time/requires cueing 10 - 24% of the time    Pain Pain Assessment  Pain Assessment: Faces Faces Pain Scale: Hurts whole lot Pain Type: Acute pain Pain Location: Shoulder Pain Orientation: Right Pain Descriptors / Indicators: Aching;Grimacing Pain Onset: With Activity Pain Intervention(s):  Repositioned;Rest  Therapy/Group: Individual Therapy  Dorice Stiggers 03/19/2016, 4:55 PM

## 2016-03-20 ENCOUNTER — Inpatient Hospital Stay (HOSPITAL_COMMUNITY): Payer: Medicare Other | Admitting: Physical Therapy

## 2016-03-20 ENCOUNTER — Inpatient Hospital Stay (HOSPITAL_COMMUNITY): Payer: Medicare Other

## 2016-03-20 ENCOUNTER — Inpatient Hospital Stay (HOSPITAL_COMMUNITY): Payer: Medicare Other | Admitting: Speech Pathology

## 2016-03-20 DIAGNOSIS — A499 Bacterial infection, unspecified: Secondary | ICD-10-CM

## 2016-03-20 DIAGNOSIS — N390 Urinary tract infection, site not specified: Secondary | ICD-10-CM

## 2016-03-20 LAB — GLUCOSE, CAPILLARY
GLUCOSE-CAPILLARY: 210 mg/dL — AB (ref 65–99)
GLUCOSE-CAPILLARY: 115 mg/dL — AB (ref 65–99)
GLUCOSE-CAPILLARY: 128 mg/dL — AB (ref 65–99)
Glucose-Capillary: 136 mg/dL — ABNORMAL HIGH (ref 65–99)

## 2016-03-20 MED ORDER — CIPROFLOXACIN HCL 250 MG PO TABS
250.0000 mg | ORAL_TABLET | Freq: Every day | ORAL | Status: AC
  Administered 2016-03-20 – 2016-03-26 (×7): 250 mg via ORAL
  Filled 2016-03-20 (×7): qty 1

## 2016-03-20 NOTE — Progress Notes (Signed)
Occupational Therapy Session Note  Patient Details  Name: [name] Colon MRN: [id number] Date of Birth: 03/15/1920  Today's Date: 03/20/2016 OT Individual Time: 0900-1000 OT Individual Time Calculation (min): 60 min    Short Term Goals: Week 2:  OT Short Term Goal 1 (Week 2): pt will perform toilet/BSC transfer with assist of 1 person in order to decrease level of assist with functional transfer OT Short Term Goal 2 (Week 2): Pt will perform UB dressing with max A in order to decrease level of assistance with self care OT Short Term Goal 3 (Week 2): Pt will perform grooming at sink with set up in order to increase independence with self care  Skilled Therapeutic Interventions/Progress Updates:    Pt engaged in BADL retraining including LB bathing/dressing at bed level and UB bathing/dressing in w/c at sink.  Pt also engaged in grooming tasks at sink.  Pt continues to require max A for supine>sit EOB and sitting balance.  Pt requires max verbal cues for sequencing during transfers.  Pt requires tot A + 2 for squat pivot transfers to w/c.  Pt requires max A for incorporating hemi bathing/dressing techniques.  Pt requires more than a reasonable amount of time to complete all tasks.  Focus on activity tolerance, sit<>stand, sitting balance, functional transfers, task initiation, and safety awareness to increase independence with BADLs and decrease burden of care.   Therapy Documentation Precautions:  Precautions Precautions: Fall Precaution Comments: R hemiparesis Restrictions Weight Bearing Restrictions: No   Pain:  Pt c/o increased pain in R wrist/hand with movement; repositioned  See Function Navigator for Current Functional Status.   Therapy/Group: Individual Therapy  Rich BraveLanier, Bradin Mcadory Chappell 03/20/2016, 10:04 AM

## 2016-03-20 NOTE — Progress Notes (Signed)
Speech Language Pathology Daily Session Note  Patient Details  Name: Ivar Drapelizabeth M Caris MRN: [id number] Date of Birth: 11/08/1920  Today's Date: 03/20/2016 SLP Individual Time: 1300-1400 SLP Individual Time Calculation (min): 60 min  Short Term Goals: Week 2: SLP Short Term Goal 1 (Week 2): Patient will utilize an increased vocal intensity at the phrase level to achieve 90% intelligibility with Min verbal cues.  SLP Short Term Goal 2 (Week 2): Patient will recall 2 events from previous therapy sessions with MIn question cues.  SLP Short Term Goal 3 (Week 2): Patient will identify 2 physical and 2 cognitive deficits with Min A verbal and contextual cues.  SLP Short Term Goal 4 (Week 2): Patient will demonstrate functional problem solving for basic and familiar tasks with Min A verbal cues.  SLP Short Term Goal 5 (Week 2): Patient will attend to right field of enviornment during functional tasks with Min A verbal cues.  SLP Short Term Goal 6 (Week 2): Patient will demonstrate sustained attention to functional tasks for 30 mintues with Min A verbal cues for redirection  Skilled Therapeutic Interventions: Skilled treatment session focused on cognitive goals. SLP facilitated session by administering the MoCA-BLIND. Patient scored 14/22 points with a score of 18 or above considered normal. Patient demonstrated deficits in the areas of immediate and short-term recall as well as attention, however, patient recalled events from previous therapy sessions and conversations with Mod I. SLP also facilitated session by providing extra time and supervision verbal cues for patient to attend right field of environment during a structured visual scanning task in a moderately visually distracting environment. Patient left upright in wheelchair with all needs within reach. Continue with current plan of care.    Function:  Cognition Comprehension Comprehension assist level: Understands basic 90% of the time/cues < 10%  of the time  Expression   Expression assist level: Expresses basic 90% of the time/requires cueing < 10% of the time.  Social Interaction Social Interaction assist level: Interacts appropriately 90% of the time - Needs monitoring or encouragement for participation or interaction.  Problem Solving Problem solving assist level: Solves basic 75 - 89% of the time/requires cueing 10 - 24% of the time  Memory Memory assist level: Recognizes or recalls 75 - 89% of the time/requires cueing 10 - 24% of the time    Pain Pain Assessment Pain Assessment: 0-10 Pain Score: 7  Pain Type: Acute pain Pain Location: Shoulder Pain Orientation: Right Pain Descriptors / Indicators: Aching;Grimacing;Sharp Pain Onset: With Activity Pain Intervention(s): Repositioned;Rest  Therapy/Group: Individual Therapy  Collier Bohnet 03/20/2016, 3:52 PM

## 2016-03-20 NOTE — Progress Notes (Signed)
[place] PHYSICAL MEDICINE & REHABILITATION     PROGRESS NOTE    Subjective/Complaints: Much brighter today. Participated in all therapies yesterday.   ROS: Pt denies fever, rash/itching, headache, blurred or double vision, nausea, vomiting, abdominal pain, diarrhea, chest pain, shortness of breath, palpitations, dysuria, dizziness,   bleeding, anxiety, or depression   Objective: Vital Signs: Blood pressure 164/63, pulse 58, temperature 98.1 F (36.7 C), temperature source Oral, resp. rate 18, height 5' (1.524 m), weight 53.978 kg (119 lb), SpO2 95 %. No results found.  Recent Labs  03/19/16 0906  WBC 4.8  HGB 13.0  HCT 39.6  PLT 276    Recent Labs  03/19/16 0906  NA 137  K 4.3  CL 106  GLUCOSE 175*  BUN 17  CREATININE 0.79  CALCIUM 8.9   CBG (last 3)   Recent Labs  03/19/16 1714 03/19/16 2124 03/20/16 0634  GLUCAP 118* 170* 136*    Wt Readings from Last 3 Encounters:  03/20/16 53.978 kg (119 lb)  03/08/16 61.6 kg (135 lb 12.9 oz)  11/30/13 59.875 kg (132 lb)    Physical Exam:  Constitutional: She is oriented to person, place, and time. She appears well-developed and well-nourished.  HENT:  Head: Normocephalic.  Mouth/Throat: oral mucosa moist.  Eyes: Conjunctivae are normal. Pupils are equal, round, and reactive to light.  Neck: Normal range of motion. Neck supple.  Cardiovascular: Normal rate and regular rhythm.  Murmur heard. Respiratory: Effort normal and breath sounds normal. No respiratory distress. She exhibits no tenderness.  GI: Soft. Bowel sounds are normal. She exhibits no distension. There is no tenderness.  Musculoskeletal:      Right arm as a whole appears less tender and hypersensitive to touch .  Neurological: She is alert and oriented to person, place, and time. A cranial nerve deficit is present.  Soft voice--improved verbal output.    Right inattention with right field cut. Right central 7. Dense right hemiparesis 0/5 in  the right upper extremity to lower extremity proximal to distal. Right hamstring with early tone while in supine.  Skin: Skin is warm and dry. Right heel with medial bruising/pressure area. Callus left heel. Early blistering on sacrum Psychiatric: Her speech is delayed. She is more engaging today.       Assessment/Plan: 1. 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 Physiatrist is providing close team supervision and 24 hour management of active medical problems listed below. Physiatrist and rehab team continue to assess barriers to discharge/monitor patient progress toward functional and medical goals.  Function:  Bathing Bathing position Bathing activity did not occur: Refused Position: Bed  Bathing parts Body parts bathed by patient: Right arm, Chest, Abdomen (UB bathing only) Body parts bathed by helper: Buttocks, Right upper leg, Right lower leg, Left lower leg, Left arm  Bathing assist Assist Level: More than reasonable time      Upper Body Dressing/Undressing Upper body dressing Upper body dressing/undressing activity did not occur: Refused What is the patient wearing?: Bra, Pull over shirt/dress Bra - Perfomed by patient: Thread/unthread left bra strap Bra - Perfomed by helper: Thread/unthread right bra strap, Hook/unhook bra (pull down sports bra) Pull over shirt/dress - Perfomed by patient: Put head through opening Pull over shirt/dress - Perfomed by helper: Thread/unthread right sleeve, Thread/unthread left sleeve, Pull shirt over trunk, Put head through opening  Upper body assist        Lower Body Dressing/Undressing Lower body dressing Lower body dressing/undressing activity did not occur: Refused What is the patient wearing?: Pants, Non-skid slipper socks       Pants- Performed by helper: Thread/unthread right  pants leg, Thread/unthread left pants leg, Pull pants up/down   Non-skid slipper socks- Performed by helper: Don/doff right sock, Don/doff left sock   Socks - Performed by helper: Don/doff right sock, Don/doff left sock   Shoes - Performed by helper: Don/doff right shoe, Don/doff left shoe, Fasten right, Fasten left          Lower body assist Assist for lower body dressing: 2 Helpers      Toileting TDesigner, multimediaoileting Toileting activity did not occur: Safety/medical concerns   Toileting steps completed by helper: Adjust clothing prior to toileting, Performs perineal hygiene, Adjust clothing after toileting    Toileting assist Assist level: Two helpers   Transfers Chair/bed transfer   Chair/bed transfer method: Squat pivot Chair/bed transfer assist level: 2 helpers Chair/bed transfer assistive device: Armrests     Locomotion Ambulation Ambulation activity did not occur: Safety/medical concerns         Wheelchair   Type: Manual   Assist Level: Dependent (Pt equals 0%)  Cognition Comprehension Comprehension assist level: Understands basic 90% of the time/cues < 10% of the time  Expression Expression assist level: Expresses basic 75 - 89% of the time/requires cueing 10 - 24% of the time. Needs helper to occlude trach/needs to repeat words.  Social Interaction Social Interaction assist level: Interacts appropriately 90% of the time - Needs monitoring or encouragement for participation or interaction.  Problem Solving Problem solving assist level: Solves basic 75 - 89% of the time/requires cueing 10 - 24% of the time  Memory Memory assist level: Recognizes or recalls 75 - 89% of the time/requires cueing 10 - 24% of the time   Medical Problem List and Plan: 1. Right hemiparesis and cognitive deficits secondary to TBI and L-MCA infarcts.   -continue CIR therapies  -  (3.5 hours per day) 2. DVT Prophylaxis/Anticoagulation: Pharmaceutical: Lovenox 3. Pain Management: Tylenol  prn -right shoulder pain after fall. Has hx of RTC issues and prior surgery bilaterally -xrays on acute negative -supporting RUE/shoulder as possible/ ktape/ splinting  -continue sl increase gabapentin  100mg  bid observing closely for tolerance 4. Mood: Team to provide ego support and positive reinforcement. Has supportive family.  -pt denies depression and daughter agrees  5. Neuropsych: This patient is not fully capable of making decisions on her own behalf. 6. Skin/Wound Care: Routine pressure relief measures. Maintain adequate nutrition and hydration status. prevalon boots for heels  -air mattress to relieve pressure on backside 7. Fluids/Electrolytes/Nutrition: appetite has been good  -follow up labs .  8. HTN: Monitor BP tid - continue coreg bid and Catapres tid. 9. DM type 2: Hgb A1c- 7.5. Check blood sugars ac/hs. Use SSI for elevated BS   -sugars under control 10. Increased lethargy: more alert today  -ua +---> begin empiric cipro, 250mg  QD  -await ucx and sens  -remainder of labs all reviewed and within normal limits     .  LOS (Days) 10 A FACE TO FACE EVALUATION WAS PERFORMED  SWARTZ,ZACHARY T 03/20/2016 9:22 AM

## 2016-03-20 NOTE — Progress Notes (Signed)
Physical Therapy Session Note  Patient Details  Name: [name] Colon MRN: [id number] Date of Birth: 01/12/1920  Today's Date: 03/20/2016 PT Individual Time: 1430-1515 PT Individual Time Calculation (min): 45 min   Short Term Goals: Week 2:  PT Short Term Goal 1 (Week 2): Pt will initate and perform 50% of bed mobility supine <> sit PT Short Term Goal 2 (Week 2): Pt will perform bed <> chair transfers with one person max A PT Short Term Goal 3 (Week 2): Pt will maintain dynamic sitting balance and sustained attention to functional task with mod A x 10 minutes PT Short Term Goal 4 (Week 2): Pt will transition to regular manual w/c during therapy for w/c mobility training x 50' with mod-max A PT Short Term Goal 5 (Week 2): Pt will perform pre-gait and standing balance activities with max A of one person  Skilled Therapeutic Interventions/Progress Updates:   Patient in TIS wheelchair, reporting fatigue from being up in wheelchair all day. Discussed with patient need to return to bed throughout day to rest in order to tolerate therapies, patient verbalized understanding and stated she took a nap every day at home, requested scheduling a break from 2-3pm to allow patient to return to bed for rest break. Patient performed squat pivot transfers wheelchair <> level mat table with total A and use of arm rest. Remainder of session focused on static sitting balance with LUE support with close supervision-total A and dynamic sitting balance to place clothespin to target anterior/slightly L to facilitate weight shifts and core activation with reaching task and return to midline with min-total A. Patient required max multimodal cues for L hand placement, upright posture, and problem solving. Patient left sitting in wheelchair due to next therapy in approx 30 min with call bell in reach and full lap tray in place.     Therapy Documentation Precautions:  Precautions Precautions: Fall Precaution Comments: R  hemiparesis Restrictions Weight Bearing Restrictions: No Pain: Pain Assessment Pain Assessment: 0-10 Pain Score: 7  Pain Type: Acute pain Pain Location: Shoulder Pain Orientation: Right Pain Descriptors / Indicators: Aching;Grimacing;Sharp Pain Onset: With Activity Pain Intervention(s): Repositioned;Rest  See Function Navigator for Current Functional Status.   Therapy/Group: Individual Therapy  Kerney ElbeVarner, [name] A 03/20/2016, 4:59 PM

## 2016-03-20 NOTE — Progress Notes (Signed)
Physical Therapy Session Note  Patient Details  Name: [name] Colon MRN: [id number] Date of Birth: 08-16-1920  Today's Date: 03/20/2016 PT Individual Time: 1545-1615 PT Individual Time Calculation (min): 30 min   Short Term Goals: Week 2:  PT Short Term Goal 1 (Week 2): Pt will initate and perform 50% of bed mobility supine <> sit PT Short Term Goal 2 (Week 2): Pt will perform bed <> chair transfers with one person max A PT Short Term Goal 3 (Week 2): Pt will maintain dynamic sitting balance and sustained attention to functional task with mod A x 10 minutes PT Short Term Goal 4 (Week 2): Pt will transition to regular manual w/c during therapy for w/c mobility training x 50' with mod-max A PT Short Term Goal 5 (Week 2): Pt will perform pre-gait and standing balance activities with max A of one person  Skilled Therapeutic Interventions/Progress Updates:    PT arrived and pt sleeping upright in w/c, does not awake to voice, but awakes to touch.  In response to inquiry about fatigue, pt states "I've been up since 7 this morning but I want to take my therapies."  PT engaged pt in activity to facilitate forward weight shift for carryover into transfers.  Pt attempted forward weight shift to reach for cards but unable to achieve any movement without assist.  Attempted to facilitate forward weight shift with LUE ranger but pt continues to require max assist for forward translation of trunk.  At this point in session, pt continually closing eyes and unable to stay alert.  Pt returned to room and +2 squat/pivot back to bed performed.  Max assist to scoot to [facility] in sitting, and max assist for sit>supine.  Pt positioned in supine with call bell in reach and needs met.  Missed 15 minutes of skilled PT due to fatigue.   Therapy Documentation Precautions:  Precautions Precautions: Fall Precaution Comments: R hemiparesis Restrictions Weight Bearing Restrictions: No General: PT Amount of Missed Time  (min): 15 Minutes PT Missed Treatment Reason: Patient fatigue  See Function Navigator for Current Functional Status.   Therapy/Group: Individual Therapy  Earnest Conroy Penven-Crew 03/20/2016, 4:29 PM

## 2016-03-21 ENCOUNTER — Inpatient Hospital Stay (HOSPITAL_COMMUNITY): Payer: Medicare Other | Admitting: Occupational Therapy

## 2016-03-21 DIAGNOSIS — N390 Urinary tract infection, site not specified: Secondary | ICD-10-CM | POA: Insufficient documentation

## 2016-03-21 DIAGNOSIS — R5383 Other fatigue: Secondary | ICD-10-CM

## 2016-03-21 LAB — GLUCOSE, CAPILLARY
GLUCOSE-CAPILLARY: 155 mg/dL — AB (ref 65–99)
Glucose-Capillary: 135 mg/dL — ABNORMAL HIGH (ref 65–99)
Glucose-Capillary: 138 mg/dL — ABNORMAL HIGH (ref 65–99)
Glucose-Capillary: 118 mg/dL — ABNORMAL HIGH (ref 65–99)

## 2016-03-21 NOTE — Progress Notes (Signed)
Occupational Therapy Session Note  Patient Details  Name: [name] Colon MRN: [id number] Date of Birth: 10/31/1920  Today's Date: 03/21/2016 OT Individual Time: 1130-1200 OT Individual Time Calculation (min): 30 min    Short Term Goals: Week 2:  OT Short Term Goal 1 (Week 2): pt will perform toilet/BSC transfer with assist of 1 person in order to decrease level of assist with functional transfer OT Short Term Goal 2 (Week 2): Pt will perform UB dressing with max A in order to decrease level of assistance with self care OT Short Term Goal 3 (Week 2): Pt will perform grooming at sink with set up in order to increase independence with self care  Skilled Therapeutic Interventions/Progress Updates:  Upon entering the room, pt supine in bed with no c/o,signs, or symptoms of pain. OT having difficult time rousing pt from sleep and utilizing cold wash cloth on face to increase alertness. Pt verbalizing needing for toileting and placed onto bed pan with +2 assist. However, pt already having accident and urinating on bed as well. Pt able to cleans peri area with OT assisting further with LB bathing. LB dresssing completed while supine in bed with +2 assist. Pt seated on EOB with total A . Squat pivot transfer +2 from EOB>wheelchair. NT remained in room to assist pt further with UB self care needs.   Therapy Documentation Precautions:  Precautions Precautions: Fall Precaution Comments: R hemiparesis Restrictions Weight Bearing Restrictions: No  See Function Navigator for Current Functional Status.   Therapy/Group: Individual Therapy  Lowella Gripittman, Andres Escandon L 03/21/2016, 12:58 PM

## 2016-03-21 NOTE — Progress Notes (Signed)
[employer] PHYSICAL MEDICINE & REHABILITATION     PROGRESS NOTE    Subjective/Complaints:  patient lying comfortably in bed this morning. She indicates he slept well overnight.   ROS:  Denies CP, SOB, nausea, vomiting, diarrhea.  Objective: Vital Signs: Blood pressure 141/55, pulse 58, temperature 98.4 F (36.9 C), temperature source Oral, resp. rate 18, height 5' (1.524 m), weight 53.978 kg (119 lb), SpO2 99 %. No results found.  Recent Labs  03/19/16 0906  WBC 4.8  HGB 13.0  HCT 39.6  PLT 276    Recent Labs  03/19/16 0906  NA 137  K 4.3  CL 106  GLUCOSE 175*  BUN 17  CREATININE 0.79  CALCIUM 8.9   CBG (last 3)   Recent Labs  03/21/16 0748 03/21/16 1131 03/21/16 1641  GLUCAP 155* 135* 138*    Wt Readings from Last 3 Encounters:  03/20/16 53.978 kg (119 lb)  03/08/16 61.6 kg (135 lb 12.9 oz)  11/30/13 59.875 kg (132 lb)    Physical Exam:  Constitutional: NAD. Vital signs reviewed.  HENT: Normocephalic.  Eyes: Conjunctivae  And EOM are normal.  Cardiovascular: Normal rate and regular rhythm.Murmur heard. Respiratory: Effort normal and breath sounds normal. No respiratory distress. She exhibits no tenderness.  GI: Soft. Bowel sounds are normal. She exhibits no distension. There is no tenderness.  Musculoskeletal:  No edema. No tenderness. Neurological: She is alert and oriented to person, place, and time.  Motor: RUE/RLE 4/5 5 proximal to distal Skin: Skin is warm and dry. Right heel with medial bruising/pressure area.  Psychiatric: Her speech is delayed.  Behavior appears normal.   Assessment/Plan: 1. 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 Physiatrist is providing close team supervision and 24 hour management of active medical problems listed below. Physiatrist and rehab team continue to assess  barriers to discharge/monitor patient progress toward functional and medical goals.  Function:  Bathing Bathing position Bathing activity did not occur: Refused Position: Bed  Bathing parts Body parts bathed by patient: Right arm, Chest, Abdomen, Front perineal area, Left upper leg Body parts bathed by helper: Left arm, Buttocks, Right upper leg, Right lower leg, Left lower leg  Bathing assist Assist Level: More than reasonable time      Upper Body Dressing/Undressing Upper body dressing Upper body dressing/undressing activity did not occur: Refused What is the patient wearing?: Bra, Pull over shirt/dress Bra - Perfomed by patient: Thread/unthread left bra strap Bra - Perfomed by helper: Thread/unthread right bra strap, Hook/unhook bra (pull down sports bra) Pull over shirt/dress - Perfomed by patient: Put head through opening Pull over shirt/dress - Perfomed by helper: Thread/unthread right sleeve, Thread/unthread left sleeve, Pull shirt over trunk, Put head through opening        Upper body assist        Lower Body Dressing/Undressing Lower body dressing Lower body dressing/undressing activity did not occur: Refused What is the patient wearing?: Pants, Non-skid slipper socks       Pants- Performed by helper: Thread/unthread right pants leg, Thread/unthread left pants leg, Pull pants up/down   Non-skid slipper socks- Performed by helper: Don/doff right sock, Don/doff left sock   Socks - Performed by helper: Don/doff right sock, Don/doff left sock   Shoes - Performed by helper: Don/doff right shoe, Don/doff left shoe, Fasten right, Fasten left  Lower body assist Assist for lower body dressing: 2 Helpers      Toileting Toileting Toileting activity did not occur: Safety/medical concerns   Toileting steps completed by helper: Adjust clothing prior to toileting, Performs perineal hygiene, Adjust clothing after toileting    Toileting assist Assist level: Two  helpers   Transfers Chair/bed transfer   Chair/bed transfer method: Squat pivot Chair/bed transfer assist level: Total assist (Pt < 25%) Chair/bed transfer assistive device: Mechanical lift     Locomotion Ambulation Ambulation activity did not occur: Safety/medical concerns         Wheelchair   Type: Manual   Assist Level: Dependent (Pt equals 0%)  Cognition Comprehension Comprehension assist level: Understands basic 90% of the time/cues < 10% of the time  Expression Expression assist level: Expresses basic 90% of the time/requires cueing < 10% of the time.  Social Interaction Social Interaction assist level: Interacts appropriately 90% of the time - Needs monitoring or encouragement for participation or interaction.  Problem Solving Problem solving assist level: Solves complex 90% of the time/cues < 10% of the time  Memory Memory assist level: Recognizes or recalls 90% of the time/requires cueing < 10% of the time   Medical Problem List and Plan: 1. Right hemiparesis and cognitive deficits secondary to TBI and L-MCA infarcts.   -continue CIR therapies  - (3.5 hours per day) 2. DVT Prophylaxis/Anticoagulation: Pharmaceutical: Lovenox 3. Pain Management: Tylenol prn -right shoulder pain after fall. Has hx of RTC issues and prior surgery bilaterally -xrays on acute negative -supporting RUE/shoulder as possible/ ktape/ splinting  -continue sl Increased gabapentin  100mg  bid observing closely for tolerance 4. Mood: Team to provide ego support and positive reinforcement. Has supportive family.  -pt denies depression and daughter agrees  5. Neuropsych: This patient is not fully capable of making decisions on her own behalf. 6. Skin/Wound Care: Routine pressure relief measures. Maintain adequate nutrition and hydration status. prevalon boots for heels  -air mattress to relieve pressure on backside 7. Fluids/Electrolytes/Nutrition: appetite  has been good.  8. HTN: Monitor BP- continue coreg bid and Catapres tid. 9. DM type 2: Hgb A1c- 7.5. Check blood sugars ac/hs. Use SSI for elevated BS   -sugars under control 10. Lethargy:   -ua +---> continue empiric cipro, 250mg  QD  -Urine culture with gram-negative rods, await sens  LOS (Days) 11 A FACE TO FACE EVALUATION WAS PERFORMED  Sharley Keeler Karis Jubanil Tayli Buch 03/21/2016 10:02 PM

## 2016-03-22 LAB — URINE CULTURE: Culture: >=100000 — AB

## 2016-03-22 LAB — GLUCOSE, CAPILLARY
GLUCOSE-CAPILLARY: 136 mg/dL — AB (ref 65–99)
GLUCOSE-CAPILLARY: 162 mg/dL — AB (ref 65–99)
GLUCOSE-CAPILLARY: 164 mg/dL — AB (ref 65–99)
Glucose-Capillary: 127 mg/dL — ABNORMAL HIGH (ref 65–99)

## 2016-03-22 MED ORDER — CLONIDINE HCL 0.2 MG PO TABS
0.3000 mg | ORAL_TABLET | Freq: Three times a day (TID) | ORAL | Status: DC
  Administered 2016-03-22 – 2016-03-26 (×11): 0.3 mg via ORAL
  Filled 2016-03-22 (×11): qty 1

## 2016-03-22 NOTE — Progress Notes (Addendum)
[place] PHYSICAL MEDICINE & REHABILITATION     PROGRESS NOTE    Subjective/Complaints: Patient sitting up in bed this morning. She notes some right ankle discomfort.  ROS:  + Right ankle discomfort. Denies CP, SOB, nausea, vomiting, diarrhea.  Objective: Vital Signs: Blood pressure 187/53, pulse 64, temperature 98 F (36.7 C), temperature source Oral, resp. rate 18, height 5' (1.524 m), weight 54.432 kg (120 lb), SpO2 100 %. No results found.  Recent Labs  03/19/16 0906  WBC 4.8  HGB 13.0  HCT 39.6  PLT 276    Recent Labs  03/19/16 0906  NA 137  K 4.3  CL 106  GLUCOSE 175*  BUN 17  CREATININE 0.79  CALCIUM 8.9   CBG (last 3)   Recent Labs  03/21/16 1641 03/21/16 2206 03/22/16 0704  GLUCAP 138* 118* 136*    Wt Readings from Last 3 Encounters:  03/22/16 54.432 kg (120 lb)  03/08/16 61.6 kg (135 lb 12.9 oz)  11/30/13 59.875 kg (132 lb)    Physical Exam:  Constitutional: NAD. Vital signs reviewed.  HENT: Normocephalic.  Eyes: Conjunctivae  And EOM are normal.  Cardiovascular: Normal rate and regular rhythm.Murmur heard. Respiratory: Effort normal and breath sounds normal. No respiratory distress. She exhibits no tenderness.  GI: Soft. Bowel sounds are normal. She exhibits no distension. There is no tenderness.  Musculoskeletal:  No edema. Very mild tenderness right posterior ankle. Neurological: She is alert and oriented to person, place, and time.  Motor: RUE/RLE 4/5 5 proximal to distal Skin: Skin is warm and dry. Right heel with medial bruising/pressure area.  Psychiatric: Her speech is delayed.  Behavior appears normal.   Assessment/Plan: 1. 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 Physiatrist is providing close team supervision and 24 hour management of active medical problems listed  below. Physiatrist and rehab team continue to assess barriers to discharge/monitor patient progress toward functional and medical goals.  Function:  Bathing Bathing position Bathing activity did not occur: Refused Position: Bed  Bathing parts Body parts bathed by patient: Right arm, Chest, Abdomen, Front perineal area, Left upper leg Body parts bathed by helper: Left arm, Buttocks, Right upper leg, Right lower leg, Left lower leg  Bathing assist Assist Level: More than reasonable time      Upper Body Dressing/Undressing Upper body dressing Upper body dressing/undressing activity did not occur: Refused What is the patient wearing?: Bra, Pull over shirt/dress Bra - Perfomed by patient: Thread/unthread left bra strap Bra - Perfomed by helper: Thread/unthread right bra strap, Hook/unhook bra (pull down sports bra) Pull over shirt/dress - Perfomed by patient: Put head through opening Pull over shirt/dress - Perfomed by helper: Thread/unthread right sleeve, Thread/unthread left sleeve, Pull shirt over trunk, Put head through opening        Upper body assist        Lower Body Dressing/Undressing Lower body dressing Lower body dressing/undressing activity did not occur: Refused What is the patient wearing?: Pants, Non-skid slipper socks       Pants- Performed by helper: Thread/unthread right pants leg, Thread/unthread left pants leg, Pull pants up/down   Non-skid slipper socks- Performed by helper: Don/doff right sock, Don/doff left sock   Socks - Performed by helper: Don/doff right sock, Don/doff left sock   Shoes - Performed by helper: Don/doff right shoe, Don/doff left shoe, Fasten right, Fasten left  Lower body assist Assist for lower body dressing: 2 Helpers      Toileting Toileting Toileting activity did not occur: Safety/medical concerns   Toileting steps completed by helper: Adjust clothing prior to toileting, Performs perineal hygiene, Adjust clothing after  toileting    Toileting assist Assist level: Two helpers   Transfers Chair/bed transfer   Chair/bed transfer method: Squat pivot Chair/bed transfer assist level: Total assist (Pt < 25%) Chair/bed transfer assistive device: Mechanical lift     Locomotion Ambulation Ambulation activity did not occur: Safety/medical concerns         Wheelchair   Type: Manual   Assist Level: Dependent (Pt equals 0%)  Cognition Comprehension Comprehension assist level: Understands basic 90% of the time/cues < 10% of the time  Expression Expression assist level: Expresses basic 90% of the time/requires cueing < 10% of the time.  Social Interaction Social Interaction assist level: Interacts appropriately 90% of the time - Needs monitoring or encouragement for participation or interaction.  Problem Solving Problem solving assist level: Solves complex 90% of the time/cues < 10% of the time  Memory Memory assist level: Recognizes or recalls 90% of the time/requires cueing < 10% of the time   Medical Problem List and Plan: 1. Right hemiparesis and cognitive deficits secondary to TBI and L-MCA infarcts.   -continue CIR therapies  - (3.5 hours per day) 2. DVT Prophylaxis/Anticoagulation: Pharmaceutical: Lovenox 3. Pain Management: Tylenol prn -right shoulder pain after fall. Has hx of RTC issues and prior surgery bilaterally -xrays on acute negative -supporting RUE/shoulder as possible/ ktape/ splinting  -Increased gabapentin  bid observing closely for tolerance 4. Mood: Team to provide ego support and positive reinforcement. Has supportive family.  -pt denies depression and daughter agrees  5. Neuropsych: This patient is not fully capable of making decisions on her own behalf. 6. Skin/Wound Care: Routine pressure relief measures. Maintain adequate nutrition and hydration status. prevalon boots for heels  -air mattress to relieve pressure on backside 7.  Fluids/Electrolytes/Nutrition: appetite has been good.  8. HTN: Monitor BP- continue coreg bid  Will increase captopril to 0.3 mg on 5/7. 9. DM type 2: Hgb A1c- 7.5. Check blood sugars ac/hs. Use SSI for elevated BS   -sugars under control 10. Lethargy:   -Urine culture with Escherichia coli, pansensitive  continue cipro,  QD 11. Right ankle discomfort  Encouraged use of ice to the elbow for initiation of any medication.  LOS (Days) 12 A FACE TO FACE EVALUATION WAS PERFORMED  Dickson Kostelnik Karis Juba 03/22/2016 9:02 AM

## 2016-03-23 ENCOUNTER — Inpatient Hospital Stay (HOSPITAL_COMMUNITY): Payer: Medicare Other

## 2016-03-23 ENCOUNTER — Inpatient Hospital Stay (HOSPITAL_COMMUNITY): Payer: Medicare Other | Admitting: Occupational Therapy

## 2016-03-23 ENCOUNTER — Inpatient Hospital Stay (HOSPITAL_COMMUNITY): Payer: Medicare Other | Admitting: Speech Pathology

## 2016-03-23 ENCOUNTER — Inpatient Hospital Stay (HOSPITAL_COMMUNITY): Payer: Medicare Other | Admitting: Physical Therapy

## 2016-03-23 LAB — GLUCOSE, CAPILLARY
GLUCOSE-CAPILLARY: 130 mg/dL — AB (ref 65–99)
GLUCOSE-CAPILLARY: 154 mg/dL — AB (ref 65–99)
Glucose-Capillary: 270 mg/dL — ABNORMAL HIGH (ref 65–99)
Glucose-Capillary: 84 mg/dL (ref 65–99)

## 2016-03-23 NOTE — Progress Notes (Addendum)
Physical Therapy Note  Patient Details  Name: [name] Colon MRN: [id number] Date of Birth: 11/16/1919 Today's Date: 03/23/2016  1400-1430, 30 min individual tx  W/c> mat squat pivot.  Rolling using bed rails L><R  X 3.    neuromuscular re-education in supine in bed, via manual cues, VCs, to attempt to facilitate R hip adductors, R hip extensors.  Lower trunk rotation for abdominal activation, strap around thighs for RLE control. R hamstring prolonged stretch in supine.   Pt left in bed with all needs within reach, family present, bed alarm set.  Shoshana Johal 03/23/2016, 10:06 AM

## 2016-03-23 NOTE — Progress Notes (Signed)
Physical Therapy Session Note  Patient Details  Name: [name] Colon MRN: [id number] Date of Birth: 05/10/1920  Today's Date: 03/23/2016 PT Individual Time: 1007-1100 PT Individual Time Calculation (min): 53 min   Short Term Goals: Week 2:  PT Short Term Goal 1 (Week 2): Pt will initate and perform 50% of bed mobility supine <> sit PT Short Term Goal 2 (Week 2): Pt will perform bed <> chair transfers with one person max A PT Short Term Goal 3 (Week 2): Pt will maintain dynamic sitting balance and sustained attention to functional task with mod A x 10 minutes PT Short Term Goal 4 (Week 2): Pt will transition to regular manual w/c during therapy for w/c mobility training x 50' with mod-max A PT Short Term Goal 5 (Week 2): Pt will perform pre-gait and standing balance activities with max A of one person  Skilled Therapeutic Interventions/Progress Updates:    Pt received in w/c leaning to R with family present assisting with breakfast.  Pt reporting pain in rectum after suppository and attempting to have BM.  Performed repositioning of hips in w/c and increased support for RUE for more equal WB through pelvis and to promote increased postural control and sitting balance.  Pt vitals assessed.  Performed partial w/c mobility training in controlled environment x 10' with pt performing with LLE only (no drive wheel on tilt in space) with max hand over LLE to facilitate activation and verbal cues for sequence.  Pt set up in standing frame for rest of session (~30-40 min) for NMR; see below.  At end of session pt placed in partial tilt for pressure relief and pt handed off to SLP for next session.  Therapy Documentation Precautions:  Precautions Precautions: Fall Precaution Comments: R hemiparesis Restrictions Weight Bearing Restrictions: No Vital Signs: Therapy Vitals Pulse Rate: (!) 59 Patient Position (if appropriate): Sitting Oxygen Therapy SpO2: 100 % O2 Device: Not Delivered Pain: Pain  Assessment Pain Assessment: No/denies pain Pain Score: 4  Pain Type: Acute pain Pain Location: Rectum Pain Orientation: Right Pain Descriptors / Indicators: Sore Pain Onset: Unable to tell Pain Intervention(s): Repositioned Other Treatments: Treatments Neuromuscular Facilitation: Right;Upper Extremity;Lower Extremity;Activity to increase motor control;Activity to increase timing and sequencing;Activity to increase sustained activation;Activity to increase lateral weight shifting;Activity to increase anterior-posterior weight shifting in standing frame with RUE supported and therapist providing constant facilitation for and verbal cues for upright head/trunk righting, sustained attention to reaching task, weight shifting and WB through RLE and RUE while pt engaged in reaching for and stacking cups x 3 at a time reaching up, forwards and to the L and across midline for increased attention to and weight shift to R.  Pt required frequent rest breaks due to fatigue and lethargy.     See Function Navigator for Current Functional Status.   Therapy/Group: Individual Therapy  Edman CircleHall, Audra [facility]aucette 03/23/2016, 12:22 PM

## 2016-03-23 NOTE — Progress Notes (Signed)
Speech Language Pathology Daily Session Note  Patient Details  Name: [name] Colon MRN: [id number] Date of Birth: 11/18/1919  Today's Date: 03/23/2016 SLP Individual Time: 1100-1200 SLP Individual Time Calculation (min): 60 min  Short Term Goals: Week 2: SLP Short Term Goal 1 (Week 2): Patient will utilize an increased vocal intensity at the phrase level to achieve 90% intelligibility with Min verbal cues.  SLP Short Term Goal 2 (Week 2): Patient will recall 2 events from previous therapy sessions with MIn question cues.  SLP Short Term Goal 3 (Week 2): Patient will identify 2 physical and 2 cognitive deficits with Min A verbal and contextual cues.  SLP Short Term Goal 4 (Week 2): Patient will demonstrate functional problem solving for basic and familiar tasks with Min A verbal cues.  SLP Short Term Goal 5 (Week 2): Patient will attend to right field of enviornment during functional tasks with Min A verbal cues.  SLP Short Term Goal 6 (Week 2): Patient will demonstrate sustained attention to functional tasks for 30 mintues with Min A verbal cues for redirection   Skilled Therapeutic Interventions: Skilled treatment session focused on cognition goals. SLP faciliated session by providing mod A verbal cues for increasing vocal intensity at the sentence level. Min A verbal cues were provided for functional problem solving for familiar tasks with Min A verbal cues for visual scanning to right. Pt sustained attention to familiar task for 40 minutes independently in a moderately distracting environment. Pt was returned to room and left with caregiver. All needs were left within reach. Of note, pt was able to communicate at appropriate volume level with caregiver at simple conversation level. This was an improvement over volume used during therapy tasks. Continue current plan of care.   Function:  Cognition Comprehension Comprehension assist level: Understands basic 90% of the time/cues < 10% of the  time  Expression   Expression assist level: Expresses basic 50 - 74% of the time/requires cueing 25 - 49% of the time. Needs to repeat parts of sentences.  Social Interaction Social Interaction assist level: Interacts appropriately 90% of the time - Needs monitoring or encouragement for participation or interaction.  Problem Solving Problem solving assist level: Solves basic 75 - 89% of the time/requires cueing 10 - 24% of the time  Memory Memory assist level: Recognizes or recalls 25 - 49% of the time/requires cueing 50 - 75% of the time    Pain Pain Assessment Pain Score: 4  Pain Type: Acute pain Pain Location: Rectum Pain Descriptors / Indicators: Sore Pain Onset: Unable to tell Pain Intervention(s): Repositioned  Therapy/Group: Individual Therapy  Windi Toro 03/23/2016, 2:45 PM

## 2016-03-23 NOTE — Progress Notes (Signed)
Occupational Therapy Session Note  Patient Details  Name: [name] Colon MRN: [id number] Date of Birth: 02-15-1920  Today's Date: 03/23/2016 OT Individual Time: 9163-8466 OT Individual Time Calculation (min): 60 min    Short Term Goals: Week 1:  OT Short Term Goal 1 (Week 1): Pt will perform toilet transfer with assist of 1 person in order to decrease level of assist with functional transfer. OT Short Term Goal 1 - Progress (Week 1):  (tot A + 2) OT Short Term Goal 2 (Week 1): Pt will engage in 5 minutes of self care tasks with 2 or less rest breaks secondary to fatigue.  OT Short Term Goal 2 - Progress (Week 1): Met OT Short Term Goal 3 (Week 1): Pt will perform UB dressing with max A in order to decrease level of assist with self care.  OT Short Term Goal 3 - Progress (Week 1):  (tot A (pt=20%)) OT Short Term Goal 4 (Week 1):  (assistance for brushing teeth and brushing hair) OT Short Term Goal 4 - Progress (Week 1): Progressing toward goal Week 2:  OT Short Term Goal 1 (Week 2): pt will perform toilet/BSC transfer with assist of 1 person in order to decrease level of assist with functional transfer OT Short Term Goal 2 (Week 2): Pt will perform UB dressing with max A in order to decrease level of assistance with self care OT Short Term Goal 3 (Week 2): Pt will perform grooming at sink with set up in order to increase independence with self care      Skilled Therapeutic Interventions/Progress Updates:    Pt seen for skilled OT to facilitate trunk control, head/neck posture, initiation with movement during transfers and awareness of RUE.  Pt received in bed. Nursing reported pt needed to use BSC. RN assisted this therapist with bed>< BSC and then bed to w/c for the squat pivot transfer. During 1st transfer, pt was going to L side and then used her L hand on [facility] handle. She ended up pushing away from [facility] making the transfer much more difficult.  For the next 2 transfers, pt cued to keep L  hand on lap. Pt requires total A to lift hips into a partial stand while 2nd person cleanses her.  Once in chair, pt engaged in B/D at sink level with mod A UB and total +2 for LB (for sit to partial stand at sink). Pt needs cues to fully wt shift forward and maintain a head lift. She has great difficulty keeping her head lifted, which inhibits her from achieving upright posture. RUE PROM focusing on finger flexors. Pt continues to have significant pain in RUE with slight movement and touch. Pt positioned in w/c with quick release belt and pillow positioned under R arm. Pt set up with meal tray, her brother in the room to assist.     Therapy Documentation Precautions:  Precautions Precautions: Fall Precaution Comments: R hemiparesis Restrictions Weight Bearing Restrictions: No     Pain: Pain Assessment Pain Assessment: 8/10 Pain Type: Chronic pain Pain Location: Shoulder Pain Orientation: Right Pain Descriptors / Indicators: Aching Pain Onset: On-going Pain Intervention(s): Repositioned   ADL:  See Function Navigator for Current Functional Status.   Therapy/Group: Individual Therapy  Clarence 03/23/2016, 12:04 PM

## 2016-03-23 NOTE — Progress Notes (Signed)
[place] PHYSICAL MEDICINE & REHABILITATION     PROGRESS NOTE    Subjective/Complaints: Patient still has periodic right arm/leg discomfort. Constipated/nurse trying to disimpact her this morning. .  ROS:   Denies CP, SOB, nausea, vomiting, diarrhea.  Objective: Vital Signs: Blood pressure 168/57, pulse 59, temperature 97.5 F (36.4 C), temperature source Oral, resp. rate 18, height 5' (1.524 m), weight 55 kg (121 lb 4.1 oz), SpO2 98 %. No results found. No results for input(s): WBC, HGB, HCT, PLT in the last 72 hours. No results for input(s): NA, K, CL, GLUCOSE, BUN, CREATININE, CALCIUM in the last 72 hours.  Invalid input(s): CO CBG (last 3)   Recent Labs  03/22/16 1627 03/22/16 2115 03/23/16 0702  GLUCAP 164* 127* 130*    Wt Readings from Last 3 Encounters:  03/23/16 55 kg (121 lb 4.1 oz)  03/08/16 61.6 kg (135 lb 12.9 oz)  11/30/13 59.875 kg (132 lb)    Physical Exam:  Constitutional: NAD. Vital signs reviewed.  HENT: Normocephalic.  Eyes: Conjunctivae  And EOM are normal.  Cardiovascular: Normal rate and regular rhythm.Murmur heard. Respiratory: Effort normal and breath sounds normal. No respiratory distress. She exhibits no tenderness.  GI: Soft. Bowel sounds are normal. She exhibits no distension. There is no tenderness.  Musculoskeletal:  No edema. Very mild tenderness right ankle, wrist, shoulder Neurological: She is alert and oriented to person, place, and time.  Motor: RUE/RLE 4/5 5 proximal to distal Skin: Skin is warm and dry. Right heel with medial bruising/pressure area.  Psychiatric: Her speech is delayed.  Behavior appears normal.   Assessment/Plan: 1. 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 Physiatrist is providing close team supervision and 24 hour management of active medical problems listed  below. Physiatrist and rehab team continue to assess barriers to discharge/monitor patient progress toward functional and medical goals.  Function:  Bathing Bathing position Bathing activity did not occur: Refused Position: Bed  Bathing parts Body parts bathed by patient: Right arm, Chest, Abdomen, Front perineal area, Left upper leg Body parts bathed by helper: Left arm, Buttocks, Right upper leg, Right lower leg, Left lower leg  Bathing assist Assist Level: More than reasonable time      Upper Body Dressing/Undressing Upper body dressing Upper body dressing/undressing activity did not occur: Refused What is the patient wearing?: Bra, Pull over shirt/dress Bra - Perfomed by patient: Thread/unthread left bra strap Bra - Perfomed by helper: Thread/unthread right bra strap, Hook/unhook bra (pull down sports bra) Pull over shirt/dress - Perfomed by patient: Put head through opening Pull over shirt/dress - Perfomed by helper: Thread/unthread right sleeve, Thread/unthread left sleeve, Pull shirt over trunk, Put head through opening        Upper body assist        Lower Body Dressing/Undressing Lower body dressing Lower body dressing/undressing activity did not occur: Refused What is the patient wearing?: Pants, Non-skid slipper socks       Pants- Performed by helper: Thread/unthread right pants leg, Thread/unthread left pants leg, Pull pants up/down   Non-skid slipper socks- Performed by helper: Don/doff right sock, Don/doff left sock   Socks - Performed by helper: Don/doff right sock, Don/doff left sock   Shoes - Performed by helper: Don/doff right shoe, Don/doff left shoe, Fasten right, Fasten left          Lower body assist Assist for lower  body dressing: 2 Designer, multimediaHelpers      Toileting Toileting Toileting activity did not occur: Safety/medical concerns   Toileting steps completed by helper: Adjust clothing prior to toileting, Performs perineal hygiene, Adjust clothing after  toileting    Toileting assist Assist level: Two helpers   Transfers Chair/bed transfer   Chair/bed transfer method: Squat pivot Chair/bed transfer assist level: Total assist (Pt < 25%) Chair/bed transfer assistive device: Mechanical lift     Locomotion Ambulation Ambulation activity did not occur: Safety/medical concerns         Wheelchair   Type: Manual   Assist Level: Dependent (Pt equals 0%)  Cognition Comprehension Comprehension assist level: Understands basic 90% of the time/cues < 10% of the time  Expression Expression assist level: Expresses basic 90% of the time/requires cueing < 10% of the time.  Social Interaction Social Interaction assist level: Interacts appropriately 90% of the time - Needs monitoring or encouragement for participation or interaction.  Problem Solving Problem solving assist level: Solves complex 90% of the time/cues < 10% of the time  Memory Memory assist level: Recognizes or recalls 90% of the time/requires cueing < 10% of the time   Medical Problem List and Plan: 1. Right hemiparesis and cognitive deficits secondary to TBI and L-MCA infarcts.   -continue CIR therapies  - (3.5 hours per day) 2. DVT Prophylaxis/Anticoagulation: Pharmaceutical: Lovenox 3. Pain Management: Tylenol prn -right shoulder pain after fall. Has hx of RTC issues and prior surgery bilaterally -xrays on acute negative -supporting RUE/shoulder as possible/ ktape/ splinting  -Increased gabapentin 100mg  bid observing closely for tolerance 4. Mood: Team to provide ego support and positive reinforcement. Has supportive family.  -pt denies depression and daughter agrees  5. Neuropsych: This patient is not fully capable of making decisions on her own behalf. 6. Skin/Wound Care: Routine pressure relief measures. Maintain adequate nutrition and hydration status. prevalon boots for heels  -air mattress to relieve pressure on backside 7.  Fluids/Electrolytes/Nutrition: appetite has been good.  8. HTN: Monitor BP- continue coreg bid   captopril increased to 0.3 mg on 5/7. 9. DM type 2: Hgb A1c- 7.5. Check blood sugars ac/hs. Use SSI for elevated BS   -sugars under control 10. Lethargy:   -Urine culture with Escherichia coli, pansensitive (has numerous "allergies"  continue cipro, 250mg  QD 11. Right ankle discomfort  Encouraged use of ice to the elbow for initiation of any medication.  LOS (Days) 13 A FACE TO FACE EVALUATION WAS PERFORMED  SWARTZ,ZACHARY T 03/23/2016 9:01 AM

## 2016-03-23 NOTE — Plan of Care (Signed)
Problem: RH BOWEL ELIMINATION Goal: RH STG MANAGE BOWEL WITH ASSISTANCE STG Manage Bowel with min Assistance.  Outcome: Not Progressing Required suppository for BM this morning Goal: RH STG MANAGE BOWEL W/MEDICATION W/ASSISTANCE STG Manage Bowel with Medication with min Assistance.  Outcome: Not Progressing Required suppository and disimpaction this am

## 2016-03-24 ENCOUNTER — Inpatient Hospital Stay (HOSPITAL_COMMUNITY): Payer: Medicare Other | Admitting: Speech Pathology

## 2016-03-24 ENCOUNTER — Inpatient Hospital Stay (HOSPITAL_COMMUNITY): Payer: Medicare Other | Admitting: Physical Therapy

## 2016-03-24 ENCOUNTER — Inpatient Hospital Stay (HOSPITAL_COMMUNITY): Payer: Medicare Other | Admitting: Occupational Therapy

## 2016-03-24 DIAGNOSIS — M7581 Other shoulder lesions, right shoulder: Secondary | ICD-10-CM

## 2016-03-24 LAB — GLUCOSE, CAPILLARY
GLUCOSE-CAPILLARY: 124 mg/dL — AB (ref 65–99)
GLUCOSE-CAPILLARY: 131 mg/dL — AB (ref 65–99)
Glucose-Capillary: 183 mg/dL — ABNORMAL HIGH (ref 65–99)
Glucose-Capillary: 195 mg/dL — ABNORMAL HIGH (ref 65–99)

## 2016-03-24 MED ORDER — TRIAMCINOLONE ACETONIDE 40 MG/ML IJ SUSP
40.0000 mg | Freq: Once | INTRAMUSCULAR | Status: AC
  Administered 2016-03-24: 40 mg via INTRA_ARTICULAR
  Filled 2016-03-24: qty 1

## 2016-03-24 MED ORDER — LIDOCAINE HCL 1 % IJ SOLN
5.0000 mL | Freq: Once | INTRAMUSCULAR | Status: AC
  Administered 2016-03-24: 5 mL
  Filled 2016-03-24: qty 5

## 2016-03-24 NOTE — Progress Notes (Signed)
[facility] PHYSICAL [facility]ICINE & REHABILITATION     PROGRESS NOTE    Subjective/Complaints: RUE pain an issue. Pt inconsistent about location. This morning the right shoulder is bothering her most. Has had injections before in the shoulder  ROS:   Denies CP, SOB, nausea, vomiting, diarrhea.  Objective: Vital Signs: Blood pressure 166/64, pulse 56, temperature 98 F (36.7 C), temperature source Oral, resp. rate 20, height 5' (1.524 m), weight 54.885 kg (121 lb), SpO2 100 %. No results found. No results for input(s): WBC, HGB, HCT, PLT in the last 72 hours. No results for input(s): NA, K, CL, GLUCOSE, BUN, CREATININE, CALCIUM in the last 72 hours.  Invalid input(s): CO CBG (last 3)   Recent Labs  03/23/16 1632 03/23/16 2208 03/24/16 0701  GLUCAP 84 154* 124*    Wt Readings from Last 3 Encounters:  03/24/16 54.885 kg (121 lb)  03/08/16 61.6 kg (135 lb 12.9 oz)  11/30/13 59.875 kg (132 lb)    Physical Exam:  Constitutional: NAD. Vital signs reviewed.  HENT: Normocephalic.  Eyes: Conjunctivae  And EOM are normal.  Cardiovascular: Normal rate and regular rhythm.Murmur heard. Respiratory: Effort normal and breath sounds normal. No respiratory distress. She exhibits no tenderness.  GI: Soft. Bowel sounds are normal. She exhibits no distension. There is no tenderness.  Musculoskeletal:  No edema.  tenderness right ankle, wrist, and more so in shoulder, especially with IR/ER Neurological: She is alert and oriented to person, place, and time.  Motor: RUE/RLE 4/5 5 proximal to distal Skin: Skin is warm and dry. Right heel with medial bruising/pressure area.  Psychiatric: Her speech is delayed.  Behavior appears normal.   Assessment/Plan: 1. 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 Physiatrist is providing close team supervision  and 24 hour management of active medical problems listed below. Physiatrist and rehab team continue to assess barriers to discharge/monitor patient progress toward functional and medical goals.  Function:  Bathing Bathing position Bathing activity did not occur: Refused Position: Wheelchair/chair at sink  Bathing parts Body parts bathed by patient: Right arm, Chest, Abdomen, Front perineal area, Left upper leg, Right upper leg Body parts bathed by helper: Right lower leg, Left lower leg, Back, Buttocks, Left arm  Bathing assist Assist Level: More than reasonable time      Upper Body Dressing/Undressing Upper body dressing Upper body dressing/undressing activity did not occur: Refused What is the patient wearing?: Bra, Pull over shirt/dress Bra - Perfomed by patient: Thread/unthread left bra strap Bra - Perfomed by helper: Thread/unthread right bra strap, Hook/unhook bra (pull down sports bra) Pull over shirt/dress - Perfomed by patient: Put head through opening, Thread/unthread left sleeve Pull over shirt/dress - Perfomed by helper: Thread/unthread right sleeve, Pull shirt over trunk        Upper body assist        Lower Body Dressing/Undressing Lower body dressing Lower body dressing/undressing activity did not occur: Refused What is the patient wearing?: Pants, Socks, Shoes       Pants- Performed by helper: Thread/unthread right pants leg, Thread/unthread left pants leg, Pull pants up/down   Non-skid slipper socks- Performed by helper: Don/doff right sock, Don/doff left sock   Socks - Performed by helper: Don/doff right sock, Don/doff left sock   Shoes - Performed by helper: Don/doff right shoe, Don/doff left shoe, Fasten right, Fasten left  Lower body assist Assist for lower body dressing: 2 Helpers      Toileting Toileting Toileting activity did not occur: Safety/medical concerns   Toileting steps completed by helper: Adjust clothing prior to toileting,  Performs perineal hygiene, Adjust clothing after toileting    Toileting assist Assist level: Two helpers   Transfers Chair/bed transfer   Chair/bed transfer method: Squat pivot Chair/bed transfer assist level: Total assist (Pt < 25%) Chair/bed transfer assistive device: Mechanical lift     Locomotion Ambulation Ambulation activity did not occur: Safety/medical concerns         Wheelchair   Type: Manual Max wheelchair distance: 10 Assist Level: Maximal assistance (Pt 25 - 49%) (foot propulsion)  Cognition Comprehension Comprehension assist level: Understands basic 90% of the time/cues < 10% of the time  Expression Expression assist level: Expresses basic 90% of the time/requires cueing < 10% of the time.  Social Interaction Social Interaction assist level: Interacts appropriately 90% of the time - Needs monitoring or encouragement for participation or interaction.  Problem Solving Problem solving assist level: Solves complex 90% of the time/cues < 10% of the time  Memory Memory assist level: Recognizes or recalls 90% of the time/requires cueing < 10% of the time   Medical Problem List and Plan: 1. Right hemiparesis and cognitive deficits secondary to TBI and L-MCA infarcts.   -continue CIR therapies  - (3.5 hours per day) 2. DVT Prophylaxis/Anticoagulation: Pharmaceutical: Lovenox 3. Pain Management: Tylenol prn -right shoulder pain after fall. Has hx of RTC issues and prior surgery bilaterally -xrays on acute negative -supporting RUE/shoulder as possible/ ktape/ splinting  -Increased gabapentin 100mg  bid observing closely for toleance  -will inject right shoulder this afternoon to see if he can help her RUE pain  4. Mood: Team to provide ego support and positive reinforcement. Has supportive family.  -pt denies depression and daughter agrees  5. Neuropsych: This patient is not fully capable of making decisions on her own behalf. 6.  Skin/Wound Care: Routine pressure relief measures. Maintain adequate nutrition and hydration status. prevalon boots for heels  -air mattress to relieve pressure on backside 7. Fluids/Electrolytes/Nutrition: appetite has been good.  8. HTN: Monitor BP- continue coreg bid   captopril increased to 0.3 mg on 5/7. 9. DM type 2: Hgb A1c- 7.5. Check blood sugars ac/hs. Use SSI for elevated BS   -sugars under control 10. Lethargy: appears improved  -Urine culture with Escherichia coli, pansensitive (has numerous "allergies"  continue cipro, 250mg  QD 11. Right ankle discomfort  Encouraged use of ice to the elbow for initiation of any medication.  LOS (Days) 14 A FACE TO FACE EVALUATION WAS PERFORMED  SWARTZ,ZACHARY T 03/24/2016 9:04 AM

## 2016-03-24 NOTE — Progress Notes (Signed)
Speech Language Pathology Daily Session Note  Patient Details  Name: [name] Colon MRN: [id number] Date of Birth: 04/13/1920  Today's Date: 03/24/2016 SLP Individual Time: [id number] SLP Individual Time Calculation (min): 45 min  Short Term Goals: Week 2: SLP Short Term Goal 1 (Week 2): Patient will utilize an increased vocal intensity at the phrase level to achieve 90% intelligibility with Min verbal cues.  SLP Short Term Goal 2 (Week 2): Patient will recall 2 events from previous therapy sessions with MIn question cues.  SLP Short Term Goal 3 (Week 2): Patient will identify 2 physical and 2 cognitive deficits with Min A verbal and contextual cues.  SLP Short Term Goal 4 (Week 2): Patient will demonstrate functional problem solving for basic and familiar tasks with Min A verbal cues.  SLP Short Term Goal 5 (Week 2): Patient will attend to right field of enviornment during functional tasks with Min A verbal cues.  SLP Short Term Goal 6 (Week 2): Patient will demonstrate sustained attention to functional tasks for 30 mintues with Min A verbal cues for redirection  Skilled Therapeutic Interventions:   Pt was seen for skilled ST targeting cognitive goals.  Pt was asleep upon arrival but presented with improved alertness when moved to a more stimulating environment.  Mod assist verbal and visual cues needed to attend to right upper extremity while repositioning in wheelchair.  Pt was able to selectively attend to a basic card game for 30 minutes in a highly distracting environment with supervision verbal cues for redirection.  Pt even demonstrated intervals of alternating attention when distracted by therapist with min assist verbal cues for redirection to task.  Pt was able to complete the abovementioned card game with overall min assist verbal cues to plan and execute a problem solving strategy.  Pt was also able to recall at least 3 specific details from previous therapy session with supervision  question cues.  Pt returned to room at the end of today's therapy session and left in wheelchair with lap tray on.  Continue per current plan of care.   Function:  Eating Eating     Eating Assist Level: Set up assist for   Eating Set Up Assist For: Cutting food;Opening containers       Cognition Comprehension Comprehension assist level: Understands basic 90% of the time/cues < 10% of the time  Expression   Expression assist level: Expresses basic 75 - 89% of the time/requires cueing 10 - 24% of the time. Needs helper to occlude trach/needs to repeat words.  Social Interaction Social Interaction assist level: Interacts appropriately 90% of the time - Needs monitoring or encouragement for participation or interaction.  Problem Solving Problem solving assist level: Solves basic 75 - 89% of the time/requires cueing 10 - 24% of the time  Memory Memory assist level: Recognizes or recalls 75 - 89% of the time/requires cueing 10 - 24% of the time    Pain Pain Assessment Pain Assessment: No/denies pain  Therapy/Group: Individual Therapy  Carney Saxton, Melanee SpryNicole L 03/24/2016, 12:48 PM

## 2016-03-24 NOTE — Progress Notes (Signed)
Physical Therapy Session Note  Patient Details  Name: [name] Colon MRN: [id number] Date of Birth: 11/09/1920  Today's Date: 03/24/2016 PT Individual Time: 1315-1400 PT Individual Time Calculation (min): 45 min   Short Term Goals: Week 2:  PT Short Term Goal 1 (Week 2): Pt will initate and perform 50% of bed mobility supine <> sit PT Short Term Goal 2 (Week 2): Pt will perform bed <> chair transfers with one person max A PT Short Term Goal 3 (Week 2): Pt will maintain dynamic sitting balance and sustained attention to functional task with mod A x 10 minutes PT Short Term Goal 4 (Week 2): Pt will transition to regular manual w/c during therapy for w/c mobility training x 50' with mod-max A PT Short Term Goal 5 (Week 2): Pt will perform pre-gait and standing balance activities with max A of one person  Skilled Therapeutic Interventions/Progress Updates:   Session focused on functional transfers, sitting balance, balance strategies, and activity tolerance. Patient eating lunch upon arrival, missed 15 min at beginning of session. Patient performed squat pivot transfers wheelchair <> mat table and wheelchair > bed with total A and max multimodal cues for head hips relationship, anterior weight shift, and L hand placement. Patient demonstrated improved overall sitting balance and balance strategies this date. Patient performed static sitting balance with LUE support and feet supported with supervision and dynamic sitting balance with functional reaching using LUE slightly outside BOS and across midline with supervision-min A and improved return to midline with no overt LOB and mild improvement with holding head up with max verbal/tactile cues. Patient fatigued, transferred sit > supine with max A and left semi reclined in bed with RUE supported on pillow and soft call bell in reach.      Therapy Documentation Precautions:  Precautions Precautions: Fall Precaution Comments: R  hemiparesis Restrictions Weight Bearing Restrictions: No General: PT Amount of Missed Time (min): 15 Minutes PT Missed Treatment Reason: Other (Comment) (eating lunch) Pain: Pain Assessment Pain Assessment: Faces Faces Pain Scale: Hurts whole lot Pain Type: Acute pain Pain Location: Arm Pain Orientation: Right Pain Descriptors / Indicators: Aching;Grimacing Pain Onset: With Activity Pain Intervention(s): Repositioned;Rest   See Function Navigator for Current Functional Status.   Therapy/Group: Individual Therapy  Kerney ElbeVarner, [name] Colon A 03/24/2016, 2:00 PM

## 2016-03-24 NOTE — Progress Notes (Signed)
Occupational Therapy Session Note  Patient Details  Name: [name] Colon MRN: [id number] Date of Birth: April 27, 1920  Today's Date: 03/24/2016 OT Individual Time: 2025-4270 OT Individual Time Calculation (min): 68 min    Short Term Goals: Week 1:  OT Short Term Goal 1 (Week 1): Pt will perform toilet transfer with assist of 1 person in order to decrease level of assist with functional transfer. OT Short Term Goal 1 - Progress (Week 1):  (tot A + 2) OT Short Term Goal 2 (Week 1): Pt will engage in 5 minutes of self care tasks with 2 or less rest breaks secondary to fatigue.  OT Short Term Goal 2 - Progress (Week 1): Met OT Short Term Goal 3 (Week 1): Pt will perform UB dressing with max A in order to decrease level of assist with self care.  OT Short Term Goal 3 - Progress (Week 1):  (tot A (pt=20%)) OT Short Term Goal 4 (Week 1):  (assistance for brushing teeth and brushing hair) OT Short Term Goal 4 - Progress (Week 1): Progressing toward goal Week 2:  OT Short Term Goal 1 (Week 2): pt will perform toilet/BSC transfer with assist of 1 person in order to decrease level of assist with functional transfer OT Short Term Goal 2 (Week 2): Pt will perform UB dressing with max A in order to decrease level of assistance with self care OT Short Term Goal 3 (Week 2): Pt will perform grooming at sink with set up in order to increase independence with self care      Skilled Therapeutic Interventions/Progress Updates:    Pt seen for skilled OT to facilitate trunk control, balance, functional mobility during ADLs. Pt received in bed and agreeable to lower body B/d from bed and then UB from w/c level. Pt c/o arm pain, cued to hold arm during rolling. Once sitting EOB, pt required max assist as she was only able to hold balance in static sitting with L arm out to side to counter weight balance for 2-3 seconds at a time. No protective responses as she would lean back. This clinician did not have a +2 A this am  and attempted use of Stedy. Even with total A, pt not able to achieve full stand to be able to move pads of stedy behind her hips. Nurse tech called in to assist with squat pivot transfer to w/c. Completed UB self care and grooming at sink with max A to lean forward to sink.  Positioned arm on lap tray and pt adjusted in chair with all needs met. Call light in reach.  Therapy Documentation Precautions:  Precautions Precautions: Fall Precaution Comments: R hemiparesis Restrictions Weight Bearing Restrictions: No    Pain: Pain Assessment Pain Assessment: 0-10 Pain Score: 8  Pain Type: Chronic pain Pain Location: Shoulder Pain Orientation: Right Pain Descriptors / Indicators: Aching Pain Intervention(s): Repositioned ADL:   See Function Navigator for Current Functional Status.   Therapy/Group: Individual Therapy  [place] 03/24/2016, 12:20 PM

## 2016-03-24 NOTE — Patient Care Conference (Signed)
Inpatient RehabilitationTeam Conference and Plan of Care Update Date: 03/24/2016   Time: 2:40 PM    Patient Name: [name] Colon      Medical Record Number: [id number]  Date of Birth: 1920-09-30 Sex: Female         Room/Bed: 4W20C/4W20C-01 Payor Info: Payor: MEDICARE / Plan: MEDICARE PART A AND B / Product Type: *No Product type* /    Admitting Diagnosis: TBI  CVA  Admit Date/Time:  03/10/2016  5:37 PM Admission Comments: No comment available   Primary Diagnosis:  Traumatic brain injury [facility]) Principal Problem: Traumatic brain injury [facility])  Patient Active Problem List   Diagnosis Date Noted  . Lethargy   . Acute lower UTI   . 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 Essential hypertension   . Thyroid activity decreased   . Diabetes mellitus type 2 in nonobese (HCC)   . Pulmonary hypertension (HCC)   . Chronic diastolic congestive heart failure (HCC)   . Cognitive deficit as late effect of traumatic brain injury (HCC)   . Traumatic brain injury (HCC)   . Multiple lacunar infarcts (HCC)   . Hypertensive crisis 03/06/2016  . SDH (subdural hematoma) (HCC) 03/06/2016  . Cerebral thrombosis with cerebral infarction 03/06/2016  . Bowel obstruction (HCC) 09/28/2013  . SIRS (systemic inflammatory response syndrome) (HCC) 09/25/2013  . Thrombocytopenia (HCC) 09/25/2013  . Hypothyroidism 09/25/2013  . Leukopenia 09/25/2013  . Poor venous access 09/25/2013  . HTN (hypertension) 09/25/2013  . ?? Wide-complex tachycardia 09/25/2013  . Dehydration with hyponatremia 09/25/2013  . Claudication (HCC) 08/21/2013  . Type 2 diabetes mellitus (HCC) 08/21/2013    Expected Discharge Date: Expected Discharge Date:  (SNF)  Team Members Present: Physician leading conference: Dr. Faith Rogue Social Worker Present: Amada Jupiter, LCSW Nurse Present: Ronny Bacon, RN PT Present: Bayard Hugger, PT OT Present: Ardis Rowan, Daneil Dolin, OT SLP Present: Feliberto Gottron, SLP PPS Coordinator present : Tora Duck, RN, CRRN     Current Status/Progress Goal Weekly Team Focus  Medical   stil struggles at times with pain tolerance, RUE---will inject shoulder today. gabapentin has not proven to provide substantial benefit  see prior, rx pain  right shoulder and wrist mgt/splinting, injection. akin care   Bowel/Bladder   Continent of bowel and bladder; LBM 5/8 after disimpaction and suppository; being treated for UTI  Min assist  Assess and treat for constipation as needed   Swallow/Nutrition/ Hydration             ADL's   mod A UB B/D, max A LB bathing, total A LB dressing, squat pivot max A +2, no active movement in RUEwith continued pain  mod A overall/w/c level  R NMR, activity tolerance, BADL retraining, trunk control/ balance, transfers    Mobility   max-total A x 2, supervision-min A sitting balance  mod A wheelchair level  R NMR, functional transfers, standing frame, sitting balance, trunk control, activity tolerance, pt/family education   Communication             Safety/Cognition/ Behavioral Observations            Pain   C/o pain in right shoulder- relieved with ultram  < 4  Assess and treat for pain q shift and prn   Skin   blisters to bilateral buttocks- foam dressing changed 5/8; air overlay mattress in use; DTI to bilateral feet- prevalon boots in use  mod assist  Assess skin q shift and prn      *  See Care Plan and progress notes for long and short-term goals.  Barriers to Discharge: age, continued neurological deficits    Possible Resolutions to Barriers:  see prior, inject shoulder today    Discharge Planning/Teaching Needs:  DC plan has changed to SNF      Team Discussion:  Received shoulder injection today.  Making gains today with EOB sitting.  Plan has changed to SNF.  Arousal level definitely affects function/ abilities.    Revisions to Treatment Plan:  Plan changed to SNF   Continued Need for Acute Rehabilitation  Level of Care: The patient requires daily medical management by a physician with specialized training in physical medicine and rehabilitation for the following conditions: Daily direction of a multidisciplinary physical rehabilitation program to ensure safe treatment while eliciting the highest outcome that is of practical value to the patient.: Yes Daily medical management of patient stability for increased activity during participation in an intensive rehabilitation regime.: Yes Daily analysis of laboratory values and/or radiology reports with any subsequent need for medication adjustment of medical intervention for : Wound care problems;Neurological problems;Other  Kaleah Hagemeister 03/24/2016, 3:42 PM

## 2016-03-24 NOTE — Progress Notes (Signed)
Social Work Patient ID: [name] DrapeElizabeth M Mcshea, female   DOB: 12/17/1919, 82 y.o.   MRN: [id number]   Anselm PancoastLucy R Cejay Cambre, LCSW Social Worker Signed  Patient Care Conference 03/24/2016  3:42 PM    Expand All Collapse All   Inpatient RehabilitationTeam Conference and Plan of Care Update Date: 03/24/2016   Time: 2:40 PM     Patient Name: [name] Colon       Medical Record Number: [id number]  Date of Birth: 02/14/1920 Sex: Female         Room/Bed: 4W20C/4W20C-01 Payor Info: Payor: MEDICARE / Plan: MEDICARE PART A AND B / Product Type: *No Product type* /    Admitting Diagnosis: TBI  CVA   Admit Date/Time:  03/10/2016  5:37 PM Admission Comments: No comment available   Primary Diagnosis:  Traumatic brain injury [facility](HCC) Principal Problem: Traumatic brain injury [facility](HCC)    Patient Active Problem List     Diagnosis  Date Noted   .  Lethargy     .  Acute lower UTI     .  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  Essential hypertension     .  Thyroid activity decreased     .  Diabetes mellitus type 2 in nonobese (HCC)     .  Pulmonary hypertension (HCC)     .  Chronic diastolic congestive heart failure (HCC)     .  Cognitive deficit as late effect of traumatic brain injury (HCC)     .  Traumatic brain injury (HCC)     .  Multiple lacunar infarcts (HCC)     .  Hypertensive crisis  03/06/2016   .  SDH (subdural hematoma) (HCC)  03/06/2016   .  Cerebral thrombosis with cerebral infarction  03/06/2016   .  Bowel obstruction (HCC)  09/28/2013   .  SIRS (systemic inflammatory response syndrome) (HCC)  09/25/2013   .  Thrombocytopenia (HCC)  09/25/2013   .  Hypothyroidism  09/25/2013   .  Leukopenia  09/25/2013   .  Poor venous access  09/25/2013   .  HTN (hypertension)  09/25/2013   .  ?? Wide-complex tachycardia  09/25/2013   .  Dehydration with hyponatremia  09/25/2013   .  Claudication (HCC)  08/21/2013   .  Type 2 diabetes mellitus (HCC)  08/21/2013     Expected  Discharge Date: Expected Discharge Date:  (SNF)  Team Members Present: Physician leading conference: Dr. Faith RogueZachary Swartz Social Worker Present: Amada JupiterLucy Shareta Fishbaugh, LCSW Nurse Present: Ronny BaconWhitney Reardon, RN PT Present: Bayard Huggerebecca Varner, PT OT Present: Ardis Rowanom [name], Daneil DolinOTA;Katie Pittman, OT SLP Present: Feliberto Gottronourtney Payne, SLP PPS Coordinator present : Tora DuckMarie Noel, RN, CRRN        Current Status/Progress  Goal  Weekly Team Focus   Medical     stil struggles at times with pain tolerance, RUE---will inject shoulder today. gabapentin has not proven to provide substantial benefit   see prior, rx pain  right shoulder and wrist mgt/splinting, injection. akin care   Bowel/Bladder     Continent of bowel and bladder; LBM 5/8 after disimpaction and suppository; being treated for UTI  Min assist  Assess and treat for constipation as needed    Swallow/Nutrition/ Hydration               ADL's     mod A UB B/D, max A LB bathing, total A LB dressing, squat pivot max A +2, no active  movement in RUEwith continued pain  mod A overall/w/c level  R NMR, activity tolerance, BADL retraining, trunk control/ balance, transfers    Mobility     max-total A x 2, supervision-min A sitting balance   mod A wheelchair level  R NMR, functional transfers, standing frame, sitting balance, trunk control, activity tolerance, pt/family education    Communication               Safety/Cognition/ Behavioral Observations              Pain     C/o pain in right shoulder- relieved with ultram   < 4  Assess and treat for pain q shift and prn   Skin     blisters to bilateral buttocks- foam dressing changed 5/8; air overlay mattress in use; DTI to bilateral feet- prevalon boots in use  mod assist  Assess skin q shift and prn      *See Care Plan and progress notes for long and short-term goals.    Barriers to Discharge:  age, continued neurological deficits      Possible Resolutions to Barriers:   see prior, inject shoulder today      Discharge Planning/Teaching Needs:   DC plan has changed to SNF       Team Discussion:    Received shoulder injection today.  Making gains today with EOB sitting.  Plan has changed to SNF.  Arousal level definitely affects function/ abilities.     Revisions to Treatment Plan:    Plan changed to SNF    Continued Need for Acute Rehabilitation Level of Care: The patient requires daily medical management by a physician with specialized training in physical medicine and rehabilitation for the following conditions: Daily direction of a multidisciplinary physical rehabilitation program to ensure safe treatment while eliciting the highest outcome that is of practical value to the patient.: Yes Daily medical management of patient stability for increased activity during participation in an intensive rehabilitation regime.: Yes Daily analysis of laboratory values and/or radiology reports with any subsequent need for medication adjustment of medical intervention for : Wound care problems;Neurological problems;Other  Evetta Renner 03/24/2016, 3:42 PM

## 2016-03-24 NOTE — Progress Notes (Signed)
Speech Language Pathology Daily Session Note  Patient Details  Name: [name] Colon MRN: [id number] Date of Birth: 11/23/1919  Today's Date: 03/24/2016 SLP Individual Time: [id number] SLP Individual Time Calculation (min): 27 min  Short Term Goals: Week 2: SLP Short Term Goal 1 (Week 2): Patient will utilize an increased vocal intensity at the phrase level to achieve 90% intelligibility with Min verbal cues.  SLP Short Term Goal 2 (Week 2): Patient will recall 2 events from previous therapy sessions with MIn question cues.  SLP Short Term Goal 3 (Week 2): Patient will identify 2 physical and 2 cognitive deficits with Min A verbal and contextual cues.  SLP Short Term Goal 4 (Week 2): Patient will demonstrate functional problem solving for basic and familiar tasks with Min A verbal cues.  SLP Short Term Goal 5 (Week 2): Patient will attend to right field of enviornment during functional tasks with Min A verbal cues.  SLP Short Term Goal 6 (Week 2): Patient will demonstrate sustained attention to functional tasks for 30 mintues with Min A verbal cues for redirection  Skilled Therapeutic Interventions: Skilled treatment session focused on addressing speech goals. Upon SLP arrival patient was sleep in bed, she awoke but remained lethargic throughout session.  SLP facilitated session by providing a structured naming task to address vocal intensity.  Patient required Mod verbal cues to achieve vocal intensity to support intelligibly in mildly noisy environment.  Patient also required Min verbal cues to think of items within the given category.  Continue with current plan of care.    Function:   Cognition Comprehension Comprehension assist level: Understands basic 90% of the time/cues < 10% of the time  Expression   Expression assist level: Expresses basic 75 - 89% of the time/requires cueing 10 - 24% of the time. Needs helper to occlude trach/needs to repeat words.  Social Interaction Social  Interaction assist level: Interacts appropriately 75 - 89% of the time - Needs redirection for appropriate language or to initiate interaction.  Problem Solving Problem solving assist level: Solves basic 75 - 89% of the time/requires cueing 10 - 24% of the time  Memory Memory assist level: Recognizes or recalls 75 - 89% of the time/requires cueing 10 - 24% of the time    Pain Pain Assessment Pain Assessment: No/denies pain  Therapy/Group: Individual Therapy  [name] Colon, M.A., CCC-SLP [phone]  [name] Colon 03/24/2016, 3:31 PM

## 2016-03-24 NOTE — NC FL2 (Signed)
[place] MEDICAID FL2 LEVEL OF CARE SCREENING TOOL     IDENTIFICATION  Patient Name: [name] Colon Birthdate: 12/25/1919 Sex: female Admission Date (Current Location): 03/10/2016  [facility] and [place] Number:  Producer, television/film/video and Address:  The . [facility], [address] [place], [place] [address]      Provider Number: [id number]  Attending Physician Name and Address:  Ranelle Oyster, MD  Relative Name and Phone Number:       Current Level of Care: Other (Comment) (Inpatient Rehab Unit) Recommended Level of Care: Skilled Nursing Facility Prior Approval Number:    Date Approved/Denied:   PASRR Number: [id number]  Discharge Plan: SNF    Current Diagnoses: Patient Active Problem List   Diagnosis Date Noted  . Lethargy   . Acute lower UTI   . 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 Essential hypertension   . Thyroid activity decreased   . Diabetes mellitus type 2 in nonobese (HCC)   . Pulmonary hypertension (HCC)   . Chronic diastolic congestive heart failure (HCC)   . Cognitive deficit as late effect of traumatic brain injury (HCC)   . Traumatic brain injury (HCC)   . Multiple lacunar infarcts (HCC)   . Hypertensive crisis 03/06/2016  . SDH (subdural hematoma) (HCC) 03/06/2016  . Cerebral thrombosis with cerebral infarction 03/06/2016  . Bowel obstruction (HCC) 09/28/2013  . SIRS (systemic inflammatory response syndrome) (HCC) 09/25/2013  . Thrombocytopenia (HCC) 09/25/2013  . Hypothyroidism 09/25/2013  . Leukopenia 09/25/2013  . Poor venous access 09/25/2013  . HTN (hypertension) 09/25/2013  . ?? Wide-complex tachycardia 09/25/2013  . Dehydration with hyponatremia 09/25/2013  . Claudication (HCC) 08/21/2013  . Type 2 diabetes mellitus (HCC) 08/21/2013    Orientation RESPIRATION BLADDER Height & Weight     Self, Time, Situation, Place  Normal Continent Weight: 54.885 kg (121 lb) Height:  5' (152.4  cm)  BEHAVIORAL SYMPTOMS/MOOD NEUROLOGICAL BOWEL NUTRITION STATUS      Continent Diet (Carb modified)  AMBULATORY STATUS COMMUNICATION OF NEEDS Skin   Extensive Assist Verbally Other (Comment) (Blisters to bilateral buttocks.  Foam dressing and air overlay mattress being used)                       Personal Care Assistance Level of Assistance  Bathing, Feeding, Dressing Bathing Assistance: Maximum assistance Feeding assistance: Limited assistance Dressing Assistance: Maximum assistance     Functional Limitations Info             SPECIAL CARE FACTORS FREQUENCY  PT (By licensed PT), OT (By licensed OT)     PT Frequency: 5x/wk OT Frequency: 5x/wk            Contractures      Additional Factors Info  Code Status, Allergies, Insulin Sliding Scale Code Status Info: Full Allergies Info: Doxycycline, Alendronate Sodium, Lasartan Potassium, Sulfa Abx, Aspirin, Penicillins   Insulin Sliding Scale Info: 1-2/ day       Current Medications (03/24/2016):  This is the current hospital active medication list Current Facility-Administered Medications  Medication Dose Route Frequency Provider Last Rate Last Dose  . acetaminophen (TYLENOL) tablet 325-650 mg  325-650 mg Oral Q4H PRN Jacquelynn Cree, PA-C   650 mg at 03/18/16 1238  . alum & mag hydroxide-simeth (MAALOX/MYLANTA) 200-200-20 MG/5ML suspension 30 mL  30 mL Oral Q4H PRN Evlyn Kanner Love, PA-C      . atorvastatin (LIPITOR) tablet 40 mg  40 mg Oral q1800 Jacquelynn Cree, PA-C   40 mg at 03/23/16 1834  . bisacodyl (DULCOLAX) suppository 10 mg  10 mg Rectal Daily PRN Jacquelynn Cree, PA-C   10 mg at 03/17/16 2005  . carvedilol (COREG) tablet 6.25 mg  6.25 mg Oral BID WC Evlyn Kanner Love, PA-C   6.25 mg at 03/24/16 0752  . ciprofloxacin (CIPRO) tablet 250 mg  250 mg Oral Q breakfast Ranelle Oyster, MD   250 mg at 03/24/16 0752  . cloNIDine (CATAPRES) tablet 0.3 mg  0.3 mg Oral TID Ankit Karis Juba, MD   0.3 mg at 03/24/16 0752  .  clopidogrel (PLAVIX) tablet 75 mg  75 mg Oral Daily Jacquelynn Cree, PA-C   75 mg at 03/24/16 1610  . diphenhydrAMINE (BENADRYL) 12.5 MG/5ML elixir 12.5-25 mg  12.5-25 mg Oral Q6H PRN Evlyn Kanner Love, PA-C      . enoxaparin (LOVENOX) injection 40 mg  40 mg Subcutaneous Q24H Pamela S Love, PA-C   40 mg at 03/23/16 2139  . gabapentin (NEURONTIN) capsule 100 mg  100 mg Oral BID Ranelle Oyster, MD   100 mg at 03/24/16 0752  . guaiFENesin-dextromethorphan (ROBITUSSIN DM) 100-10 MG/5ML syrup 5-10 mL  5-10 mL Oral Q6H PRN Jacquelynn Cree, PA-C      . insulin aspart (novoLOG) injection 0-15 Units  0-15 Units Subcutaneous TID WC Jacquelynn Cree, PA-C   2 Units at 03/24/16 [phone]  . levothyroxine (SYNTHROID, LEVOTHROID) tablet 75 mcg  75 mcg Oral QAC breakfast Jacquelynn Cree, PA-C   75 mcg at 03/24/16 0618  . lidocaine (XYLOCAINE) 1 % (with pres) injection 5 mL  5 mL Other Once Ranelle Oyster, MD      . loratadine (CLARITIN) tablet 10 mg  10 mg Oral Daily Jacquelynn Cree, PA-C   10 mg at 03/24/16 5409  . MUSCLE RUB CREA   Topical TID Ranelle Oyster, MD      . pantoprazole (PROTONIX) EC tablet 40 mg  40 mg Oral QHS Evlyn Kanner Love, PA-C   40 mg at 03/23/16 2138  . primidone (MYSOLINE) tablet 250 mg  250 mg Oral QHS Jacquelynn Cree, PA-C   250 mg at 03/23/16 2139  . prochlorperazine (COMPAZINE) tablet 5-10 mg  5-10 mg Oral Q6H PRN Jacquelynn Cree, PA-C       Or  . prochlorperazine (COMPAZINE) injection 5-10 mg  5-10 mg Intramuscular Q6H PRN Jacquelynn Cree, PA-C       Or  . prochlorperazine (COMPAZINE) suppository 12.5 mg  12.5 mg Rectal Q6H PRN Jacquelynn Cree, PA-C      . senna-docusate (Senokot-S) tablet 1 tablet  1 tablet Oral BID Jacquelynn Cree, PA-C   1 tablet at 03/24/16 8119  . sodium chloride (OCEAN) 0.65 % nasal spray 1 spray  1 spray Each Nare Q12H PRN Erick Colace, MD      . sodium phosphate (FLEET) 7-19 GM/118ML enema 1 enema  1 enema Rectal Once PRN Jacquelynn Cree, PA-C      . traMADol Janean Sark) tablet 25 mg   25 mg Oral Q6H PRN Evlyn Kanner Love, PA-C   25 mg at 03/23/16 0543  . traZODone (DESYREL) tablet 25-50 mg  25-50 mg Oral QHS PRN Evlyn Kanner Love, PA-C      . triamcinolone acetonide (KENALOG-40) injection 40 mg  40 mg Intra-articular Once Ranelle Oyster, MD  Discharge Medications: Please see discharge summary for a list of discharge medications.  Relevant Imaging Results:  Relevant Lab Results:   Additional Information SS#: [id number]  Amada JupiterHOYLE, Nykira Reddix, LCSW

## 2016-03-25 ENCOUNTER — Inpatient Hospital Stay (HOSPITAL_COMMUNITY): Payer: Medicare Other | Admitting: Occupational Therapy

## 2016-03-25 ENCOUNTER — Inpatient Hospital Stay (HOSPITAL_COMMUNITY): Payer: Medicare Other | Admitting: Speech Pathology

## 2016-03-25 ENCOUNTER — Inpatient Hospital Stay (HOSPITAL_COMMUNITY): Payer: Medicare Other | Admitting: *Deleted

## 2016-03-25 ENCOUNTER — Inpatient Hospital Stay (HOSPITAL_COMMUNITY): Payer: Medicare Other | Admitting: Physical Therapy

## 2016-03-25 DIAGNOSIS — M7581 Other shoulder lesions, right shoulder: Secondary | ICD-10-CM | POA: Insufficient documentation

## 2016-03-25 LAB — GLUCOSE, CAPILLARY
GLUCOSE-CAPILLARY: 162 mg/dL — AB (ref 65–99)
GLUCOSE-CAPILLARY: 202 mg/dL — AB (ref 65–99)
GLUCOSE-CAPILLARY: 168 mg/dL — AB (ref 65–99)
Glucose-Capillary: 172 mg/dL — ABNORMAL HIGH (ref 65–99)

## 2016-03-25 MED ORDER — SENNOSIDES-DOCUSATE SODIUM 8.6-50 MG PO TABS
2.0000 | ORAL_TABLET | Freq: Two times a day (BID) | ORAL | Status: DC
  Administered 2016-03-25 – 2016-03-26 (×2): 2 via ORAL
  Filled 2016-03-25 (×2): qty 2

## 2016-03-25 MED ORDER — POLYETHYLENE GLYCOL 3350 17 G PO PACK
17.0000 g | PACK | Freq: Once | ORAL | Status: DC
  Filled 2016-03-25: qty 1

## 2016-03-25 NOTE — Progress Notes (Signed)
Occupational Therapy Session Note  Patient Details  Name: [name] Colon MRN: [id number] Date of Birth: 04/24/1920  Today's Date: 03/25/2016 OT Individual Time: 1132-1200 OT Individual Time Calculation (min): 28 min    Short Term Goals: Week 2:  OT Short Term Goal 1 (Week 2): pt will perform toilet/BSC transfer with assist of 1 person in order to decrease level of assist with functional transfer OT Short Term Goal 2 (Week 2): Pt will perform UB dressing with max A in order to decrease level of assistance with self care OT Short Term Goal 3 (Week 2): Pt will perform grooming at sink with set up in order to increase independence with self care  Skilled Therapeutic Interventions/Progress Updates:    Pt seen for OT session focusing on functional sitting balance, weight shifts, and attention to R. Pt sitting up in w/c upon arrival, agreeable to tx session. Pt taken to therapy gym total A. Armrests removed from chair, pt reclined fully upright, and feet removed from foot rests. Worked on anterior/posterior weight shifts having pt lean forward into therapist for core strengthening/ stability. Pt R UE placed on therapist's thigh for weightbearing. She required min cuing to return to midline as pt with R lean.   She then completed functional reaching task, required to match playing cards to Velcro board placed in front of her. Pt able to complete anterior weightshift with supervision, however, was unable to maintain midline orientation, leaning to R and required VCs and min A to return to midline. Pt returned to home at end of session. Left sitting up in w/c with all needs in reach and caregiver present.   Therapy Documentation Precautions:  Precautions Precautions: Fall Precaution Comments: R hemiparesis Restrictions Weight Bearing Restrictions: No Pain: Pain Assessment Pain Assessment: No/denies pain Pain Score: 0-No pain  See Function Navigator for Current Functional  Status.   Therapy/Group: Individual Therapy  Lewis, Marylee Belzer C 03/25/2016, 7:18 AM

## 2016-03-25 NOTE — Progress Notes (Signed)
Physical Therapy Discharge Summary  Patient Details  Name: [name] Colon MRN: [id number] Date of Birth: 1920-07-18  Today's Date: 03/25/2016 PT Individual Time: 1300-1410 PT Individual Time Calculation (min): 70 min    Patient has met 1 of 8 long term goals due to improved activity tolerance, improved balance, improved postural control, improved attention and improved awareness.  Patient to discharge at a wheelchair level Total Assist. Patient's care partner unavailable to provide the necessary physical and cognitive assistance at discharge, therefore patient to discharge to SNF.  Reasons goals not met: Patient has made minimal progress toward goals and continues to require max-total A for dynamic sitting balance, wheelchair propulsion, bed mobility, and functional transfers.   Recommendation:  Patient will benefit from ongoing skilled PT services in skilled nursing facility setting to continue to advance safe functional mobility, address ongoing impairments in activity tolerance, cognition, initiation, R attention, R flaccid hemiplegia, postural control, sitting > standing balance, pain, and balance strategies and minimize fall risk.  Equipment: No equipment provided  Reasons for discharge: discharge from hospital  Patient/family agrees with progress made and goals achieved: Yes  PT Discharge Precautions/Restrictions Precautions Precautions: Fall Precaution Comments: R hemiplegia Restrictions Weight Bearing Restrictions: No Pain Pain Assessment Pain Assessment: No/denies pain Vision/Perception   Defer to OT discharge summary  Cognition Overall Cognitive Status: Impaired/Different from baseline Arousal/Alertness: Lethargic Orientation Level: Oriented X4 Attention: Focused;Sustained Focused Attention: Appears intact Focused Attention Impairment: Verbal basic;Functional basic Sustained Attention: Impaired Sustained Attention Impairment: Verbal basic;Functional  basic Memory: Impaired Memory Impairment: Decreased recall of new information Awareness: Impaired Awareness Impairment: Intellectual impairment Problem Solving: Impaired Problem Solving Impairment: Verbal basic;Functional basic Safety/Judgment: Impaired [employer] Scales of Cognitive Functioning: Purposeful/appropriate Sensation Sensation Light Touch: Appears Intact Stereognosis: Not tested Hot/Cold: Appears Intact Proprioception: Impaired Detail Proprioception Impaired Details: Impaired RUE;Impaired RLE Coordination Gross Motor Movements are Fluid and Coordinated: No Fine Motor Movements are Fluid and Coordinated: No Motor  Motor Motor: Hemiplegia;Abnormal postural alignment and control;Abnormal tone  Mobility Bed Mobility Sit to Supine: 3: Mod assist;HOB flat;With rail Transfers Transfers: Yes Sit to Stand: 1: +1 Total assist Stand to Sit: 1: +1 Total assist Squat Pivot Transfers: 1: +1 Total assist;With armrests Locomotion  Ambulation Ambulation: No Gait Gait: No Stairs / Additional Locomotion Stairs: No Wheelchair Mobility Wheelchair Mobility: Yes Wheelchair Assistance: 2: Max Lexicographer: Left upper extremity Wheelchair Parts Management: Needs assistance Distance: 150 ft  Trunk/Postural Assessment  Cervical Assessment Cervical Assessment: Exceptions to [facility] (forward flexed) Thoracic Assessment Thoracic Assessment: Exceptions to [facility] (kyphotic) Lumbar Assessment Lumbar Assessment: Exceptions to [facility] (posterior pelvic tilt) Postural Control Postural Control: Deficits on evaluation Protective Responses: impaired/insufficient  Balance Balance Balance Assessed: Yes Static Sitting Balance Static Sitting - Balance Support: Left upper extremity supported;Feet supported (Simultaneous filing. User may not have seen previous data.) Static Sitting - Level of Assistance: 5: Stand by assistance;4: Min assist (Simultaneous filing. User may not have  seen previous data.) Dynamic Sitting Balance Dynamic Sitting - Balance Support: Feet supported;During functional activity Dynamic Sitting - Level of Assistance: 4: Min assist;3: Mod assist;2: Max assist;1: +1 Total assist Static Standing Balance Static Standing - Level of Assistance: 1: +1 Total assist Extremity Assessment  RUE Assessment RUE Assessment: Exceptions to [facility] (0/5 flaccid) LUE Assessment LUE Assessment: Within Functional Limits RLE Assessment RLE Assessment: Exceptions to [facility] RLE Strength RLE Overall Strength: Deficits RLE Overall Strength Comments: 0/5 throughout RLE Tone RLE Tone: Flaccid LLE Assessment LLE Assessment: Exceptions to [facility] LLE Strength LLE  Overall Strength: Deficits LLE Overall Strength Comments: grossly 4+/5 except hip flexion 3/5   See Function Navigator for Current Functional Status.  Carney Living A 03/25/2016, 3:23 PM

## 2016-03-25 NOTE — Progress Notes (Signed)
Social Work Patient ID: [name], female   DOB: 05/24/1920, 82 y.o.   MRN: [id number]  Have received SNF bed offers from 3 facilities who could admit pt tomorrow.  Have reviewed info with pt's daughter, [name] Colon, who is currently driving back to [place] from [place].  She will review with her mother this afternoon and let me know decision.  Dr. Riley KillSwartz and team aware.  Plan to d/c tomorrow but which facility TBD.  Willye Javier, LCSW

## 2016-03-25 NOTE — Progress Notes (Signed)
[place] PHYSICAL MEDICINE & REHABILITATION     PROGRESS NOTE    Subjective/Complaints: Slept well. Thinks that right shoulder a little better. Doesn't like her "burnt pancakes"  ROS:   Denies CP, SOB, nausea, vomiting, diarrhea.  Objective: Vital Signs: Blood pressure 177/54, pulse 50, temperature 97.8 F (36.6 C), temperature source Oral, resp. rate 18, height 5' (1.524 m), weight 60.328 kg (133 lb), SpO2 100 %. No results found. No results for input(s): WBC, HGB, HCT, PLT in the last 72 hours. No results for input(s): NA, K, CL, GLUCOSE, BUN, CREATININE, CALCIUM in the last 72 hours.  Invalid input(s): CO CBG (last 3)   Recent Labs  03/24/16 1720 03/24/16 2133 03/25/16 0705  GLUCAP 131* 195* 162*    Wt Readings from Last 3 Encounters:  03/25/16 60.328 kg (133 lb)  03/08/16 61.6 kg (135 lb 12.9 oz)  11/30/13 59.875 kg (132 lb)    Physical Exam:  Constitutional: NAD. Vital signs reviewed.  HENT: Normocephalic.  Eyes: Conjunctivae  And EOM are normal.  Cardiovascular: Normal rate and regular rhythm.Murmur heard. Respiratory: Effort normal and breath sounds normal. No respiratory distress. She exhibits no tenderness.  GI: Soft. Bowel sounds are normal. She exhibits no distension. There is no tenderness.  Musculoskeletal:  No edema.  tenderness right ankle, wrist, and shoulder a little less tender today Neurological: She is alert and oriented to person, place, and time.  Motor: RUE/RLE 4/5 5 proximal to distal Skin: Skin is warm and dry. Right heel with medial bruising/pressure area.  Psychiatric: Her speech is delayed.  Behavior appears normal.   Assessment/Plan: 1. 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 Physiatrist is providing close team supervision and 24 hour management of active medical problems listed  below. Physiatrist and rehab team continue to assess barriers to discharge/monitor patient progress toward functional and medical goals.  Function:  Bathing Bathing position Bathing activity did not occur: Refused Position: Bed  Bathing parts Body parts bathed by patient: Right arm, Chest, Abdomen, Front perineal area, Left upper leg, Right upper leg Body parts bathed by helper: Right lower leg, Left lower leg, Back, Buttocks, Left arm  Bathing assist Assist Level: More than reasonable time      Upper Body Dressing/Undressing Upper body dressing Upper body dressing/undressing activity did not occur: Refused What is the patient wearing?: Bra, Pull over shirt/dress Bra - Perfomed by patient: Thread/unthread left bra strap Bra - Perfomed by helper: Thread/unthread right bra strap, Hook/unhook bra (pull down sports bra) Pull over shirt/dress - Perfomed by patient: Put head through opening, Thread/unthread left sleeve Pull over shirt/dress - Perfomed by helper: Thread/unthread right sleeve, Pull shirt over trunk        Upper body assist        Lower Body Dressing/Undressing Lower body dressing Lower body dressing/undressing activity did not occur: Refused What is the patient wearing?: Pants, Socks, Shoes       Pants- Performed by helper: Thread/unthread right pants leg, Thread/unthread left pants leg, Pull pants up/down   Non-skid slipper socks- Performed by helper: Don/doff right sock, Don/doff left sock   Socks - Performed by helper: Don/doff right sock, Don/doff left sock   Shoes - Performed by helper: Don/doff right shoe, Don/doff left shoe, Fasten right, Fasten left          Lower body assist Assist for lower body dressing: 2 Helpers  Toileting Toileting Toileting activity did not occur: Safety/medical concerns   Toileting steps completed by helper: Adjust clothing prior to toileting, Performs perineal hygiene, Adjust clothing after toileting    Toileting assist  Assist level: Two helpers   Transfers Chair/bed transfer   Chair/bed transfer method: Squat pivot Chair/bed transfer assist level: Total assist (Pt < 25%) Chair/bed transfer assistive device: Armrests     Locomotion Ambulation Ambulation activity did not occur: Safety/medical concerns         Wheelchair   Type: Manual (TIS) Max wheelchair distance: 10 Assist Level: Dependent (Pt equals 0%)  Cognition Comprehension Comprehension assist level: Understands complex 90% of the time/cues 10% of the time  Expression Expression assist level: Expresses basic 75 - 89% of the time/requires cueing 10 - 24% of the time. Needs helper to occlude trach/needs to repeat words.  Social Interaction Social Interaction assist level: Interacts appropriately 75 - 89% of the time - Needs redirection for appropriate language or to initiate interaction.  Problem Solving Problem solving assist level: Solves basic 75 - 89% of the time/requires cueing 10 - 24% of the time  Memory Memory assist level: Recognizes or recalls 75 - 89% of the time/requires cueing 10 - 24% of the time   Medical Problem List and Plan: 1. Right hemiparesis and cognitive deficits secondary to TBI and L-MCA infarcts.   -continue CIR therapies  - (3.5 hours per day)  -now for SNF placement due to slow progress 2. DVT Prophylaxis/Anticoagulation: Pharmaceutical: Lovenox 3. Pain Management: Tylenol prn -right shoulder pain after fall. Has hx of RTC issues and prior surgery bilaterally -xrays on acute negative -supporting RUE/shoulder as possible/ ktape/ splinting  -Increased gabapentin 100mg  bid observing closely for toleance  -observe for effects of injection as she mobilizes 4. Mood: Team to provide ego support and positive reinforcement. Has supportive family.  -pt denies depression and daughter agrees  5. Neuropsych: This patient is not fully capable of making decisions on her own  behalf. 6. Skin/Wound Care: Routine pressure relief measures. Maintain adequate nutrition and hydration status. prevalon boots for heels  -air mattress to relieve pressure on backside 7. Fluids/Electrolytes/Nutrition: appetite has been good.  8. HTN: Monitor BP- continue coreg bid   captopril increased to 0.3 mg on 5/7. 9. DM type 2: Hgb A1c- 7.5. Check blood sugars ac/hs. Use SSI for elevated BS   -sugars under control 10. Lethargy: appears improved  -Urine culture with Escherichia coli, pansensitive (has numerous "allergies"  continue cipro, 250mg  QD 11. Right ankle discomfort  Encouraged use of ice to the elbow for initiation of any medication.    ADDENDUM: (late entry for 03/24/16) After informed consent and preparation of the skin with betadine and isopropyl alcohol, I injected 6mg  (1cc) of celestone and 4cc of 1% lidocaine into the right subacromial space via lateral approach. Additionally, aspiration was performed prior to injection. The patient tolerated well, and no complications were encountered. Afterward the area was cleaned and dressed.     LOS (Days) 15 A FACE TO FACE EVALUATION WAS PERFORMED  [name] Colon T 03/25/2016 9:08 AM

## 2016-03-25 NOTE — Progress Notes (Signed)
Report received from off-going nurse. Patient in stable condition, resting in bed, with no signs of acute distress noted. Call light and personal belongings within reach. Able to make needs known. Will continue to monitor.

## 2016-03-25 NOTE — Progress Notes (Signed)
Speech Language Pathology Daily Session Note  Patient Details  Name: [name] Colon MRN: [id number] Date of Birth: 01/14/1920  Today's Date: 03/25/2016 SLP Individual Time: [id number] SLP Individual Time Calculation (min): 27 min  Short Term Goals: Week 2: SLP Short Term Goal 1 (Week 2): Patient will utilize an increased vocal intensity at the phrase level to achieve 90% intelligibility with Min verbal cues.  SLP Short Term Goal 2 (Week 2): Patient will recall 2 events from previous therapy sessions with MIn question cues.  SLP Short Term Goal 3 (Week 2): Patient will identify 2 physical and 2 cognitive deficits with Min A verbal and contextual cues.  SLP Short Term Goal 4 (Week 2): Patient will demonstrate functional problem solving for basic and familiar tasks with Min A verbal cues.  SLP Short Term Goal 5 (Week 2): Patient will attend to right field of enviornment during functional tasks with Min A verbal cues.  SLP Short Term Goal 6 (Week 2): Patient will demonstrate sustained attention to functional tasks for 30 mintues with Min A verbal cues for redirection  Skilled Therapeutic Interventions: Skilled treatment session focused on cognitive goals. Upon arrival, patient was asleep while supine in bed. Patient initially required Max A verbal cues for arousal and to maintain her eyes being open for ~30-60 second intervals. However, as session continued, patient maintained arousal for ~15 minutes with Min A verbal cues. Patient requested to call her [place] to check [place] and required extra time and Min A question cues for navigation of familiar phone. Patient also required Min A verbal cues for use of an increased vocal intensity at the phrase level to achieve 90% intelligibility. Patient left supine in bed with all needs within reach and son present. Continue with current plan of care.    Function:  Cognition Comprehension Comprehension  assist level: Understands complex 90% of the time/cues 10% of the time  Expression   Expression assist level: Expresses basic 75 - 89% of the time/requires cueing 10 - 24% of the time. Needs helper to occlude trach/needs to repeat words.  Social Interaction Social Interaction assist level: Interacts appropriately 75 - 89% of the time - Needs redirection for appropriate language or to initiate interaction.  Problem Solving Problem solving assist level: Solves basic 75 - 89% of the time/requires cueing 10 - 24% of the time  Memory Memory assist level: Recognizes or recalls 75 - 89% of the time/requires cueing 10 - 24% of the time    Pain Pain Assessment Pain Assessment: No/denies pain  Therapy/Group: Individual Therapy  Maksim Peregoy 03/25/2016, 3:35 PM

## 2016-03-25 NOTE — Progress Notes (Signed)
Occupational Therapy Discharge Summary  Patient Details  Name: [name] Colon MRN: [id number] Date of Birth: 09/09/1920  Patient has met 2 of 13 long term goals due to improved activity tolerance, ability to compensate for deficits and improved attention. Pt's progress was minimal and inconsistent during this admission.  Pt continues to exhibit limited activity tolerance and awareness of her deficits.  Pt required max verbal cues for task initiation, sequencing, and balance reactions. Pt is discharging to SNF prior to scheduled discharge date.  Patient to discharge at overall Total Assist level.  Patient's care partner unavailable to provide the necessary physical assistance at discharge.    Reasons goals not met: Pt was very limited by flaccid RUE and RLE, severely impaired trunk control, activity tolerance of only a few minutes at a time, R arm pain, R visual inattention, and decreased awareness of deficits.  Recommendation:  Patient will benefit from ongoing skilled OT services in skilled nursing facility setting to continue to advance functional skills in the area of Reduce care partner burden.  Equipment: No equipment provided  Reasons for discharge: discharge from hospital  Patient/family agrees with progress made and goals achieved: Yes  OT Discharge Precautions/Restrictions  Precautions Precautions: Fall Precaution Comments: R hemiplegia Restrictions Weight Bearing Restrictions: No   Vision/Perception  Vision- History Baseline Vision/History: Wears glasses Wears Glasses: Reading only Patient Visual Report: No change from baseline Vision- Assessment Vision Assessment?: No apparent visual deficits  Cognition Overall Cognitive Status: Impaired/Different from baseline Arousal/Alertness: Lethargic Orientation Level: Oriented X4 Attention: Focused;Sustained Focused Attention: Appears intact Focused Attention Impairment: Verbal basic;Functional basic Sustained Attention:  Impaired Sustained Attention Impairment: Verbal basic;Functional basic Memory: Impaired Memory Impairment: Decreased recall of new information Awareness: Impaired Awareness Impairment: Intellectual impairment Problem Solving: Impaired Problem Solving Impairment: Verbal basic;Functional basic Safety/Judgment: Impaired [employer] Scales of Cognitive Functioning: Purposeful/appropriate Sensation Sensation Light Touch: Appears Intact Stereognosis: Not tested Hot/Cold: Appears Intact Proprioception: Impaired Detail Proprioception Impaired Details: Impaired RUE;Impaired RLE Coordination Gross Motor Movements are Fluid and Coordinated: No Fine Motor Movements are Fluid and Coordinated: No Motor  Motor Motor: Hemiplegia;Abnormal postural alignment and control;Abnormal tone Mobility  Bed Mobility Sit to Supine: 3: Mod assist;HOB flat;With rail Transfers Sit to Stand: 1: +1 Total assist Stand to Sit: 1: +1 Total assist  Trunk/Postural Assessment  Cervical Assessment Cervical Assessment: Exceptions to [facility] (forward flexed) Thoracic Assessment Thoracic Assessment: Exceptions to [facility] (kyphotic) Lumbar Assessment Lumbar Assessment: Exceptions to [facility] (posterior pelvic tilt) Postural Control Postural Control: Deficits on evaluation Protective Responses: impaired/insufficient  Balance Balance Balance Assessed: Yes Static Sitting Balance Static Sitting - Balance Support: Left upper extremity supported;Feet supported (Simultaneous filing. User may not have seen previous data.) Static Sitting - Level of Assistance: 5: Stand by assistance;4: Min assist (Simultaneous filing. User may not have seen previous data.) Dynamic Sitting Balance Dynamic Sitting - Balance Support: Feet supported;During functional activity Dynamic Sitting - Level of Assistance: 4: Min assist;3: Mod assist;2: Max assist;1: +1 Total assist Static Standing Balance Static Standing - Level of Assistance: 1: +1 Total  assist Extremity/Trunk Assessment RUE Assessment RUE Assessment: Exceptions to WFL (0/5 flacid) LUE Assessment LUE Assessment: Within Functional Limits   See Function Navigator for Current Functional Status.  Leotis Shames [facility] 03/25/2016, 3:29 PM

## 2016-03-25 NOTE — Progress Notes (Signed)
Occupational Therapy Session Note  Patient Details  Name: [name] Colon MRN: [id number] Date of Birth: 12/14/1919  Today's Date: 03/25/2016 OT Individual Time: 0900-1015 OT Individual Time Calculation (min): 75 min    Short Term Goals: Week 2:  OT Short Term Goal 1 (Week 2): pt will perform toilet/BSC transfer with assist of 1 person in order to decrease level of assist with functional transfer OT Short Term Goal 2 (Week 2): Pt will perform UB dressing with max A in order to decrease level of assistance with self care OT Short Term Goal 3 (Week 2): Pt will perform grooming at sink with set up in order to increase independence with self care  Skilled Therapeutic Interventions/Progress Updates:    Pt eating breakfast upon arrival.  Pt required assistance opening containers and cutting food but is able to self feed with more than a reasonable amount of time to complete task.  Pt engaged in LB bathing and dressing tasks requiring max verbal cues for task initiation and sequencing.  Pt requires max A for rolling to right and left to facilitate pulling pants over hips.  Pt required max A for supine>sit EOB, max A for sitting balance, and tot A + 2 for squat pivot transfer with max verbal cues for sequencing.  Pt completed UB bathing and dressing tasks seated in w/c at sink.  Pt continues to require max verbal cues for compensatory strategies during bathing and dressing tasks.  Pt remained in w/c with lap tray in place and all needs within reach.  Focus on bed mobility, functional transfers, sitting balance, task initiation, sequencing, and safety awareness to increase independence with BADLs and decrease burden of care.   Therapy Documentation Precautions:  Precautions Precautions: Fall Precaution Comments: R hemiparesis Restrictions Weight Bearing Restrictions: No  Pain: Pt c/o ongoing hypersensitivity in RUE with movement and tactile input  See Function Navigator for Current Functional  Status.   Therapy/Group: Individual Therapy  Rich BraveLanier, Juancarlos Crescenzo Chappell 03/25/2016, 10:49 AM

## 2016-03-25 NOTE — Progress Notes (Signed)
Speech Language Pathology Daily Session Note  Patient Details  Name: [name] Colon MRN: [id number] Date of Birth: 04/12/1920  Today's Date: 03/25/2016 SLP Individual Time: 1035-1105 SLP Individual Time Calculation (min): 30 min  Short Term Goals: Week 2: SLP Short Term Goal 1 (Week 2): Patient will utilize an increased vocal intensity at the phrase level to achieve 90% intelligibility with Min verbal cues.  SLP Short Term Goal 2 (Week 2): Patient will recall 2 events from previous therapy sessions with MIn question cues.  SLP Short Term Goal 3 (Week 2): Patient will identify 2 physical and 2 cognitive deficits with Min A verbal and contextual cues.  SLP Short Term Goal 4 (Week 2): Patient will demonstrate functional problem solving for basic and familiar tasks with Min A verbal cues.  SLP Short Term Goal 5 (Week 2): Patient will attend to right field of enviornment during functional tasks with Min A verbal cues.  SLP Short Term Goal 6 (Week 2): Patient will demonstrate sustained attention to functional tasks for 30 mintues with Min A verbal cues for redirection  Skilled Therapeutic Interventions: Skilled treatment session focused on addressing cognitive-linguistic goals. SLP facilitated session by providing pictures of two images along with Min verbal, sentence completion cues to verbally compare and contrast items.  Patient verbalized awareness of difficulty with task that she would have been able to easily complete prior to admission.  Notified primary SLP.  Patient able to state 2 physical deficits post CVA as well as able to state that her thinking was slower now.  Patient also benefited from Min verbal cues for repetitions to achieve 90% intelligibility at the sentence-conversational level with the presence of mild environmental distractions.  Continue with current plan of care.   Function:  Cognition Comprehension Comprehension assist level: Understands complex 90% of the time/cues 10%  of the time  Expression   Expression assist level: Expresses basic 75 - 89% of the time/requires cueing 10 - 24% of the time. Needs helper to occlude trach/needs to repeat words.  Social Interaction Social Interaction assist level: Interacts appropriately 75 - 89% of the time - Needs redirection for appropriate language or to initiate interaction.  Problem Solving Problem solving assist level: Solves basic 75 - 89% of the time/requires cueing 10 - 24% of the time  Memory Memory assist level: Recognizes or recalls 75 - 89% of the time/requires cueing 10 - 24% of the time    Pain Pain Assessment Pain Assessment: No/denies pain Pain Score: 0-No pain  Therapy/Group: Individual Therapy  Charlane FerrettiMelissa Emma-Lee Oddo, M.A., CCC-SLP [phone]  Espiridion Supinski 03/25/2016, 11:34 AM

## 2016-03-25 NOTE — Discharge Summary (Signed)
Physician Discharge Summary  Patient ID: TALER KUSHNER MRN: [id number] DOB/AGE: 08-03-1920 82 y.o.  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    Labs:  Basic Metabolic Panel: BMP Latest Ref Rng 03/19/2016 03/11/2016 03/10/2016  Glucose 65 - 99 mg/dL 782(N) 562(Z) 308(M)  BUN 6 - 20 mg/dL Creatinine 0.44 - 1.00 mg/dL 5.78 4.69 6.29  Sodium 135 - 145 mmol/L 137 137 135  Potassium 3.5 - 5.1 mmol/L 4.3 4.2 3.7  Chloride 101 - 111 mmol/L 106 107 104  CO2 22 - 32 mmol/L 20(L) 22 19(L)  Calcium 8.9 - 10.3 mg/dL 8.9 5.2(W) 4.1(L)     CBC: CBC Latest Ref Rng 03/19/2016 03/11/2016 03/10/2016  WBC 4.0 - 10.5 K/uL 4.8 5.1 4.5  Hemoglobin 12.0 - 15.0 g/dL 24.4 01.0 27.2  Hematocrit 36.0 - 46.0 % 39.6 39.8 41.4  Platelets 150 - 400 K/uL 276 201 175     CBG:  Recent Labs Lab 03/25/16 0705 03/25/16 1203 03/25/16 1722 03/25/16 2100 03/26/16 0649  GLUCAP 162* 202* 168* 172* 149*    Today's Vitals   03/25/16 0934 03/25/16 1300 03/25/16 1920 03/26/16 0300  BP:  145/55  157/55  Pulse:  60  53  Temp:  97.6 F (36.4 C)  97.7 F (36.5 C)  TempSrc:  Oral  Oral  Resp:  18  18  Height:      Weight:      SpO2:  100%  100%  PainSc: 0-No pain  0-No pain      Brief HPI:   [name] Colon is a 82 y.o. female with history of HTN, DM who fell and struck her head with LOC about 10 seconds and was noted to have MS changes on 03/05/16. She was evaluated in ED and CT head negative but BP elevated with  SBP > 250. She was started on IV labetalol and was found to flaccid right hemiparesis. Repeat CT head showed small acute Left cerebellar and left posterior temporal lobe SDH with moderate right scalp hematoma and moderate to severe small vessel disease. Bilateral hip films negative for fracture. MRI brain showed acute cortical infarcts posterior L-MCA peri-rolandic cortex superimposed small acute lacunar infarcts right thalamus and small Left occipital and posterior hemorraghic contusions. Dr. Pearlean Brownie consulted and felt that patient with Left MCA and L-PCA infarcts due to rapid BP lowering in setting of severe L-ICA stenosis but A fib still in DDX.  Repeat CT head done 04/25 and patient started on plavix as she refused ASA.  VVS consulted for input on ICA stenosis and patient to follow up in office. Patient with resultant dense right hemiplegia and dizziness affecting mobility and ability to carry out ADLs. CIR was recommended for follow up therapy.    Hospital Course: ADRIANN THAU was admitted to rehab 03/10/2016 for inpatient therapies to consist of PT, ST and OT at least three hours five days a week. Past admission physiatrist, therapy team and rehab RN have worked together to provide customized  collaborative inpatient rehab. She has tolerated plavix without side effects and H/H has been stable. Po intake has slowly been improving. She is continent of bowel and bladder. She continued to report pain right shoulder and RUE.  X rays of left and right shoulders were negative for fracture or dislocation and symptoms felt to be due to RTC tendinitis.  This has been treated with local measures such as aspercreme, ice and taping.  She has significant dysesthesias RUE limiting activity.  Neurontin was added and titrated to 100 mg bid with close monitoring for tolerance. This has not proven to provide significant benefit but this was not titrated further due to increase in fatigue.   Blood pressures have been  labile therefore catapres was added on 05/7 and has been titrated to tid for better control. She did have increase in lethargy on  05/04 due to E coli UTI.  She was placed on cipro with improvement in mentaion. She completes 7 day course of antibiotic regimen today.  Air mattress has been used to relieve pressure and Prevalon boots were ordered to prevent breakdown of bilateral heels.  Diabetes has been monitored with achs checks and blood sugars have been reasonable on SSI.   She has required disimpaction due to issues with severe constipation and bowel program has been augmented with good results.  She has made minimal progress due to right flaccid hemiplegia, problems with initiation, pain as well as right inattention. She currently requires max to total assist and family is unable to provide care needed. They have elected on SNF for progressive therapies and she was discharged to [facility] on 03/26/16.    Rehab course: During patient's stay in rehab weekly team conferences were held to monitor patient's progress, set goals and discuss barriers to discharge. At admission, patient required total assist with mobility and basic self-care tasks. She required min to moderate cues for functional and familiar tasks with decrease in vocal intensity affecting intelligibility.  She has had improvement in activity tolerance, postural control and attention but her progress has been limited by significant deficits, pain as well as fatigue. She requires moderate assistance for bed mobility and +1 total assist for stand pivot transfers. She requires max verbal cues for initiation and sequencing of ADL tasks. She requires mod to max assist for bathing and total assist for LB dressing.  She requires min cues to increase vocal intensity and for speech intelligibility. She requires min assist with cues for basic tasks.     Disposition:  Skilled Nursing facility.    Diet: Carb modified medium.   Special  Instructions: 1.Check blood sugars ac/hs and use SSI for elevated BS.  2. Routine pressure relief measures.  3. Recheck lytes and repeat UA in next 2-3 days.      Medication List    TAKE these medications        acetaminophen 325 MG tablet  Commonly known as:  TYLENOL  Take 1-2 tablets (325-650 mg total) by mouth every 4 (four) hours as needed for mild pain.     alum & mag hydroxide-simeth 200-200-20 MG/5ML suspension  Commonly known as:  MAALOX/MYLANTA  Take 30 mLs by mouth every 4 (four) hours as needed for indigestion.     atorvastatin 40 MG tablet  Commonly known as:  LIPITOR  Take 1 tablet (40 mg total) by mouth daily at 6 PM.     bisacodyl 10 MG suppository  Commonly known as:  DULCOLAX  Place 1 suppository (10 mg  total) rectally daily as needed for moderate constipation.     carvedilol 6.25 MG tablet  Commonly known as:  COREG  Take 6.25 mg by mouth 2 (two) times daily.     Cholecalciferol 2000 units Caps  Take 2,000 Units by mouth daily.     cloNIDine 0.1 MG tablet  Commonly known as:  CATAPRES  Take 0.1 mg by mouth 3 (three) times daily.     clopidogrel 75 MG tablet  Commonly known as:  PLAVIX  Take 1 tablet (75 mg total) by mouth daily.     gabapentin 100 MG capsule  Commonly known as:  NEURONTIN  Take 1 capsule (100 mg total) by mouth 2 (two) times daily.     levothyroxine 75 MCG tablet  Commonly known as:  SYNTHROID, LEVOTHROID  Take 75 mcg by mouth daily before breakfast.     loratadine 10 MG tablet  Commonly known as:  CLARITIN  Take 10 mg by mouth daily.     MUSCLE RUB 10-15 % Crea  Apply 1 application topically 3 (three) times daily.     pantoprazole 40 MG tablet  Commonly known as:  PROTONIX  Take 1 tablet (40 mg total) by mouth at bedtime.     polyethylene glycol packet  Commonly known as:  MIRALAX / GLYCOLAX  Take 17 g by mouth once.     primidone 250 MG tablet  Commonly known as:  MYSOLINE  Take 250 mg by mouth at bedtime.      senna-docusate 8.6-50 MG tablet  Commonly known as:  Senokot-S  Take 2 tablets by mouth 2 (two) times daily.     sodium chloride 0.65 % Soln nasal spray  Commonly known as:  OCEAN  Place 1 spray into both nostrils every 12 (twelve) hours as needed for congestion.     traMADol 50 MG tablet--Rx 3 10 pills   Commonly known as:  ULTRAM  Take 0.5 tablets (25 mg total) by mouth every 12 (twelve) hours as needed for moderate pain.       Follow-up Information    Follow up with Faith Rogue MD.   Why:  office will call you with appointment   Contact information:   Address/Tel number after June 10 th: [address] [place], [place], [place] [phone]      Follow up with Fabienne Bruns, MD On 04/23/2016.   Specialties:  Vascular Surgery, Cardiology   Why:  appointment at 10:45 am   Contact information:   [address] [place] [address] [phone]       Follow up with Xu,Jindong, MD. Call today.   Specialty:  Neurology   Why:  for follow up appointment   Contact information:   [address] [place] [address] [phone]       Follow up with Nanetta Batty, MD. Call today.   Specialties:  Cardiology, Radiology   Why:  for appointment to place 30 day heart monitor.    Contact information:   [address] [place] [address] [phone]       Follow up with Ranelle Oyster, MD.   Specialty:  Physical Medicine and Rehabilitation   Why:  office to call you with follow up appointment   Contact information:   [address], [address] [place] [address] [phone]       Follow up with [facility] .   Specialty:  Skilled Nursing Facility   Contact information:   [address] [place] [id number] [phone]      Signed: Jacquelynn CreeLove, Jasmene Goswami S 03/26/2016, 9:34 AM

## 2016-03-25 NOTE — Plan of Care (Signed)
Problem: RH Balance Goal: LTG Patient will maintain dynamic sitting balance (PT) LTG: Patient will maintain dynamic sitting balance with assistance during mobility activities (PT)  Outcome: Adequate for Discharge Patient has made minimal progress toward goal.   Problem: RH Bed Mobility Goal: LTG Patient will perform bed mobility with assist (PT) LTG: Patient will perform bed mobility with assistance, with/without cues (PT).  Outcome: Adequate for Discharge Patient has made minimal progress toward goal.  Problem: RH Bed to Chair Transfers Goal: LTG Patient will perform bed/chair transfers w/assist (PT) LTG: Patient will perform bed/chair transfers with assistance, with/without cues (PT).  Outcome: Adequate for Discharge Goal not met due to lack of patient progress.  Problem: RH Car Transfers Goal: LTG Patient will perform car transfers with assist (PT) LTG: Patient will perform car transfers with assistance (PT).  Outcome: Adequate for Discharge Goal not met due to lack of patient progress.  Problem: RH Furniture Transfers Goal: LTG Patient will perform furniture transfers w/assist (OT/PT LTG: Patient will perform furniture transfers with assistance (OT/PT).  Outcome: Adequate for Discharge Goal not met due to lack of patient progress.  Problem: RH Wheelchair Mobility Goal: LTG Patient will propel w/c in controlled environment (PT) LTG: Patient will propel wheelchair in controlled environment, # of feet with assist (PT)  Outcome: Adequate for Discharge Goal not met due to patient unable to reach floor with LE and unsafe for transfers from hemi height wheelchair.

## 2016-03-25 NOTE — Progress Notes (Signed)
Nutrition Follow-up  DOCUMENTATION CODES:   Not applicable  INTERVENTION:  Continue Carbohydrate modified diet.   Encourage adequate PO intake.  NUTRITION DIAGNOSIS:   Increased nutrient needs related to  (therapy) as evidenced by estimated needs; ongoing  GOAL:   Patient will meet greater than or equal to 90% of their needs; met  MONITOR:   PO intake, Weight trends, Labs, I & O's, Skin  REASON FOR ASSESSMENT:   Low Braden    ASSESSMENT:   82 y.o. female with history of HTN, DM who fell and struck her head with LOC about 10 seconds and was noted to have MS changes on 03/05/16. She was evaluated in ED and CT head negative but BP elevated with SBP > 250. She was started on IV labetalol and was found to flaccid right hemiparesis. Repeat CT head showed small acute Left cerebellar and left posterior temporal lobe SDH with moderate right scalp hematoma and moderate to severe small vessel disease.  Meal completion has been 50-95% with most intake at 75-85%. Pt reports having a good appetite with no other difficulties. Pt encouraged to eat her food at meals. Plans for SNF on discharge. Labs and medications reviewed.   Diet Order:  Diet Carb Modified Fluid consistency:: Thin; Room service appropriate?: Yes  Skin:  Wound (see comment) (DTI on heels)  Last BM:  5/8  Height:   Ht Readings from Last 1 Encounters:  03/10/16 5' (1.524 m)    Weight:   Wt Readings from Last 1 Encounters:  03/25/16 133 lb (60.328 kg)    Ideal Body Weight:  45.45 kg  BMI:  Body mass index is 25.97 kg/(m^2).  Estimated Nutritional Needs:   Kcal:  1450-1650  Protein:  60-70 grams  Fluid:  >/= 1.5 L/day  EDUCATION NEEDS:   No education needs identified at this time  Corrin Parker, MS, RD, LDN Pager # [phone] After hours/ weekend pager # [phone]

## 2016-03-26 ENCOUNTER — Inpatient Hospital Stay (HOSPITAL_COMMUNITY): Payer: Medicare Other

## 2016-03-26 ENCOUNTER — Inpatient Hospital Stay (HOSPITAL_COMMUNITY): Payer: Medicare Other | Admitting: Physical Therapy

## 2016-03-26 ENCOUNTER — Inpatient Hospital Stay (HOSPITAL_COMMUNITY): Payer: Medicare Other | Admitting: Speech Pathology

## 2016-03-26 LAB — GLUCOSE, CAPILLARY
Glucose-Capillary: 149 mg/dL — ABNORMAL HIGH (ref 65–99)
Glucose-Capillary: 125 mg/dL — ABNORMAL HIGH (ref 65–99)

## 2016-03-26 MED ORDER — PANTOPRAZOLE SODIUM 40 MG PO TBEC: 40.0000 mg | DELAYED_RELEASE_TABLET | Freq: Every day | ORAL | Status: AC

## 2016-03-26 MED ORDER — SENNOSIDES-DOCUSATE SODIUM 8.6-50 MG PO TABS: 2.0000 | ORAL_TABLET | Freq: Two times a day (BID) | ORAL | Status: AC

## 2016-03-26 MED ORDER — SALINE SPRAY 0.65 % NA SOLN: 1.0000 | Freq: Two times a day (BID) | NASAL | Status: AC | PRN

## 2016-03-26 MED ORDER — BISACODYL 10 MG RE SUPP: 10.0000 mg | Freq: Every day | RECTAL | Status: AC | PRN

## 2016-03-26 MED ORDER — ACETAMINOPHEN 325 MG PO TABS: 325.0000 mg | ORAL_TABLET | ORAL | Status: AC | PRN

## 2016-03-26 MED ORDER — POLYETHYLENE GLYCOL 3350 17 G PO PACK: 17.0000 g | PACK | Freq: Once | ORAL | Status: AC

## 2016-03-26 MED ORDER — TRAMADOL HCL 50 MG PO TABS: 25.0000 mg | ORAL_TABLET | Freq: Two times a day (BID) | ORAL | Status: AC | PRN

## 2016-03-26 MED ORDER — CLOPIDOGREL BISULFATE 75 MG PO TABS: 75.0000 mg | ORAL_TABLET | Freq: Every day | ORAL | Status: AC

## 2016-03-26 MED ORDER — ALUM & MAG HYDROXIDE-SIMETH 200-200-20 MG/5ML PO SUSP: 30.0000 mL | ORAL | Status: AC | PRN

## 2016-03-26 MED ORDER — ATORVASTATIN CALCIUM 40 MG PO TABS: 40.0000 mg | ORAL_TABLET | Freq: Every day | ORAL | Status: AC

## 2016-03-26 MED ORDER — GABAPENTIN 100 MG PO CAPS: 100.0000 mg | ORAL_CAPSULE | Freq: Two times a day (BID) | ORAL | Status: AC

## 2016-03-26 MED ORDER — MUSCLE RUB 10-15 % EX CREA: 1.0000 "application " | TOPICAL_CREAM | Freq: Three times a day (TID) | CUTANEOUS | Status: AC

## 2016-03-26 NOTE — Progress Notes (Signed)
Patient being transported by [facility] [place]. Report given to [facility]issa, RN, and all questions answered. Patient daughter packed all belongings and taken with patient. [place]

## 2016-03-26 NOTE — Progress Notes (Signed)
Social Work  Discharge Note  The overall goal for the admission was met for:   Discharge location: No - plan changed to SNF as pt not at a level that can be managed by family/ caregiver  Length of Stay: Yes - actually d/c earlier to originally targeted d/c date - LOS=14 days  Discharge activity level: No - ranges mod/max/total assist w/c level  Home/community participation: No  Services provided included: MD, RD, PT, OT, SLP, RN, TR, Pharmacy, Neuropsych and SW  Financial Services: Medicare and Private Insurance: [place]  Follow-up services arranged: Other: SNF @ [facility] [place]  Comments (or additional information):  Patient/Family verbalized understanding of follow-up arrangements: Yes  Individual responsible for coordination of the follow-up plan: pt/dtr  Confirmed correct DME delivered: NA    Lunabelle Oatley

## 2016-03-26 NOTE — Progress Notes (Signed)
[place] PHYSICAL MEDICINE & REHABILITATION     PROGRESS NOTE    Subjective/Complaints: Didn't sleep as well. Tired this morning.  ROS:   Denies CP, SOB, nausea, vomiting, diarrhea.  Objective: Vital Signs: Blood pressure 157/55, pulse 53, temperature 97.7 F (36.5 C), temperature source Oral, resp. rate 18, height 5' (1.524 m), weight 60.328 kg (133 lb), SpO2 100 %. No results found. No results for input(s): WBC, HGB, HCT, PLT in the last 72 hours. No results for input(s): NA, K, CL, GLUCOSE, BUN, CREATININE, CALCIUM in the last 72 hours.  Invalid input(s): CO CBG (last 3)   Recent Labs  03/25/16 1722 03/25/16 2100 03/26/16 0649  GLUCAP 168* 172* 149*    Wt Readings from Last 3 Encounters:  03/25/16 60.328 kg (133 lb)  03/08/16 61.6 kg (135 lb 12.9 oz)  11/30/13 59.875 kg (132 lb)    Physical Exam:  Constitutional: NAD. Vital signs reviewed.  HENT: Normocephalic.  Eyes: Conjunctivae  And EOM are normal.  Cardiovascular: Normal rate and regular rhythm.Murmur heard. Respiratory: Effort normal and breath sounds normal. No respiratory distress. She exhibits no tenderness.  GI: Soft. Bowel sounds are normal. She exhibits no distension. There is no tenderness.  Musculoskeletal:  No edema.  tenderness right ankle, wrist, and shoulder a little less tender today Neurological: She is alert and oriented to person, place, and time.  Motor: RUE/RLE 4/5 5 proximal to distal Skin: Skin is warm and dry. Right heel with medial bruising/pressure area.  Psychiatric: Her speech is delayed.  Behavior appears normal.   Assessment/Plan: 1. 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 Physiatrist is providing close team supervision and 24 hour management of active medical problems listed below. Physiatrist and rehab team continue to assess  barriers to discharge/monitor patient progress toward functional and medical goals.  Function:  Bathing Bathing position Bathing activity did not occur: Refused Position: Bed (and w/c at sink)  Bathing parts Body parts bathed by patient: Right arm, Chest, Abdomen, Front perineal area, Left upper leg, Right upper leg Body parts bathed by helper: Right lower leg, Left lower leg, Back, Buttocks, Left arm  Bathing assist Assist Level: More than reasonable time      Upper Body Dressing/Undressing Upper body dressing Upper body dressing/undressing activity did not occur: Refused What is the patient wearing?: Bra, Pull over shirt/dress Bra - Perfomed by patient: Thread/unthread left bra strap Bra - Perfomed by helper: Thread/unthread right bra strap, Hook/unhook bra (pull down sports bra) Pull over shirt/dress - Perfomed by patient: Put head through opening, Thread/unthread left sleeve Pull over shirt/dress - Perfomed by helper: Thread/unthread right sleeve, Pull shirt over trunk        Upper body assist        Lower Body Dressing/Undressing Lower body dressing Lower body dressing/undressing activity did not occur: Refused What is the patient wearing?: Pants, Socks, Shoes       Pants- Performed by helper: Thread/unthread right pants leg, Thread/unthread left pants leg, Pull pants up/down   Non-skid slipper socks- Performed by helper: Don/doff right sock, Don/doff left sock   Socks - Performed by helper: Don/doff right sock, Don/doff left sock   Shoes - Performed by helper: Don/doff right shoe, Don/doff left shoe, Fasten right, Fasten left          Lower body assist Assist for lower body dressing: 2 Helpers  Toileting Toileting Toileting activity did not occur: Safety/medical concerns   Toileting steps completed by helper: Adjust clothing prior to toileting, Performs perineal hygiene, Adjust clothing after toileting    Toileting assist Assist level: Two helpers    Transfers Chair/bed transfer   Chair/bed transfer method: Squat pivot Chair/bed transfer assist level: Total assist (Pt < 25%) Chair/bed transfer assistive device: Armrests     Locomotion Ambulation Ambulation activity did not occur: Safety/medical concerns         Wheelchair   Type: Manual Max wheelchair distance: 150 Assist Level: Maximal assistance (Pt 25 - 49%)  Cognition Comprehension Comprehension assist level: Understands complex 90% of the time/cues 10% of the time  Expression Expression assist level: Expresses basic 75 - 89% of the time/requires cueing 10 - 24% of the time. Needs helper to occlude trach/needs to repeat words.  Social Interaction Social Interaction assist level: Interacts appropriately 75 - 89% of the time - Needs redirection for appropriate language or to initiate interaction.  Problem Solving Problem solving assist level: Solves basic 75 - 89% of the time/requires cueing 10 - 24% of the time  Memory Memory assist level: Recognizes or recalls 75 - 89% of the time/requires cueing 10 - 24% of the time   Medical Problem List and Plan: 1. Right hemiparesis and cognitive deficits secondary to TBI and L-MCA infarcts.   -continue CIR therapies  - (3.5 hours per day)  -now for SNF placement as she's having difficulty tolerating this level of rehab and will require longer term plan 2. DVT Prophylaxis/Anticoagulation: Pharmaceutical: Lovenox 3. Pain Management: Tylenol prn -right shoulder pain after fall. Has hx of RTC issues--some improvement after steroid injection -xrays on acute negative -supporting RUE/shoulder as possible/ ktape/ splinting  -Increased gabapentin 100mg  bid observing closely for tolerance--i am going to d/c as i suspect this is contributing to fatigue and i'm not sure it's providing any benefit  -observe for effects of injection as she mobilizes 4. Mood: Team to provide ego support and positive  reinforcement. Has supportive family.  -pt denies depression and daughter agrees  5. Neuropsych: This patient is not fully capable of making decisions on her own behalf. 6. Skin/Wound Care: Routine pressure relief measures. Maintain adequate nutrition and hydration status. prevalon boots for heels  -air mattress to relieve pressure on backside 7. Fluids/Electrolytes/Nutrition: appetite has been good.  8. HTN: Monitor BP- continue coreg bid   captopril increased to 0.3 mg on 5/7. 9. DM type 2: Hgb A1c- 7.5. Check blood sugars ac/hs. Use SSI for elevated BS   -sugars under control 10. Lethargy: still an issue---see above  -related to age,cva, med, bladder, etc  -Urine culture with Escherichia coli, pansensitive (has numerous "allergies"  continue cipro, 250mg  QD---through today  .     LOS (Days) 16 A FACE TO FACE EVALUATION WAS PERFORMED  SWARTZ,ZACHARY T 03/26/2016 8:35 AM

## 2016-03-26 NOTE — Progress Notes (Signed)
Patient states she needs to "go but nothing will come out." Per report, has history of constipation with disimpaction. Rectal vault clear. Enema given, per request.

## 2016-03-27 ENCOUNTER — Telehealth: Payer: Self-pay

## 2016-03-27 NOTE — Telephone Encounter (Signed)
Left message for patient to call back  

## 2016-03-27 NOTE — Progress Notes (Signed)
Speech Language Pathology Discharge Summary  Patient Details  Name: [name] Colon MRN: [id number] Date of Birth: Feb 08, 1920  Patient has met 1 of 5 long term goals.  Patient to discharge at [facility] level.   Reasons goals not met: Patient continues to require overall Min A for recall and problem solving with functional tasks and Mod A for intellectual awareness of deficits. Patient also requires intermitttent supervision-Min A verbal cues for use of an increased vocal intensity. Patient with a shorter length of stay due to SNF placement.     Clinical Impression/Discharge Summary: Patient made functional but inconsistent gains this admission and met 1 of 5 LTG's. Currently, patient requires overall supervision for sustained-selective attention to functional tasks, Min A verbal cues for functional problem solving and recall during familiar tasks and Mod A verbal cues for attention to right field of environment and awareness of deficits. Patient's progress and inconsistent progress was limited by fatigue which also impacted her speech intelligibility intermittently in which she required supervision-Min A verbal cues for use of an increased vocal intensity. Patient's family is unable to provide the necessary assistance needed at this time, therefore, patient will discharge to a SNF. Patient would benefit from f/u SLP services to maximize her speech intelligibility and cognitive function in order to maximize her overall functional independence and reduce caregiver burden.   Care Partner:  Caregiver Able to Provide Assistance: No  Type of Caregiver Assistance: Physical;Cognitive  Recommendation:  Skilled Nursing facility;24 hour supervision/assistance  Rationale for SLP Follow Up: Maximize cognitive function and independence;Maximize functional communication;Reduce caregiver burden   Equipment: N/A   Reasons for discharge: Discharged from hospital   Patient/Family Agrees with Progress Made and  Goals Achieved: Yes     Hilda, [place] 03/27/2016, 7:12 AM

## 2016-04-01 NOTE — Telephone Encounter (Signed)
Unable to reach patient. Have tried to call patient multiple times without success.

## 2016-04-07 ENCOUNTER — Encounter: Payer: Medicare Other | Admitting: Physical Medicine & Rehabilitation

## 2016-04-17 ENCOUNTER — Encounter: Payer: Self-pay | Admitting: Vascular Surgery

## 2016-04-21 ENCOUNTER — Encounter (HOSPITAL_COMMUNITY): Payer: Medicare Other

## 2016-04-22 ENCOUNTER — Ambulatory Visit: Payer: Medicare Other | Admitting: Cardiovascular Disease

## 2016-04-23 ENCOUNTER — Ambulatory Visit: Payer: Medicare Other | Admitting: Vascular Surgery

## 2016-06-08 ENCOUNTER — Ambulatory Visit: Payer: Medicare Other | Admitting: Neurology

## 2016-06-09 ENCOUNTER — Encounter: Payer: Self-pay | Admitting: Neurology

## 2016-12-17 DEATH — deceased

## 2017-05-26 IMAGING — DX DG KNEE COMPLETE 4+V*L*
4 series · 4 of 4 positions shown · non-contrast
Comparison: None.

CLINICAL DATA: Pain and swelling to the left knee after a fall this
evening.

EXAM:
LEFT KNEE - COMPLETE 4+ VIEW

[x knee ap left]
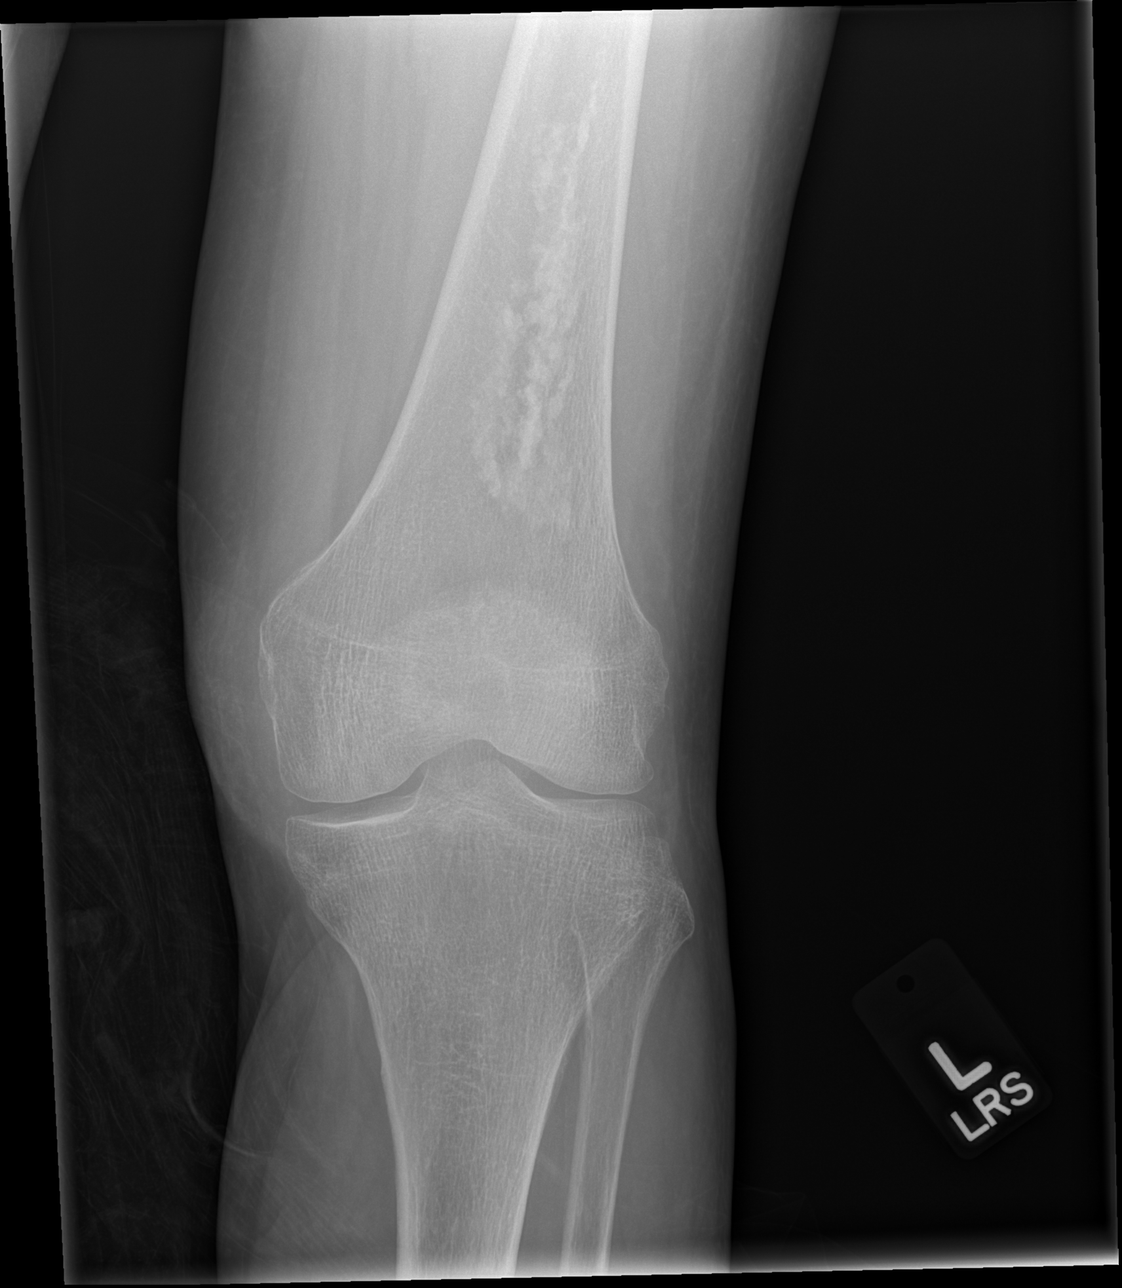

[x knee obl left]
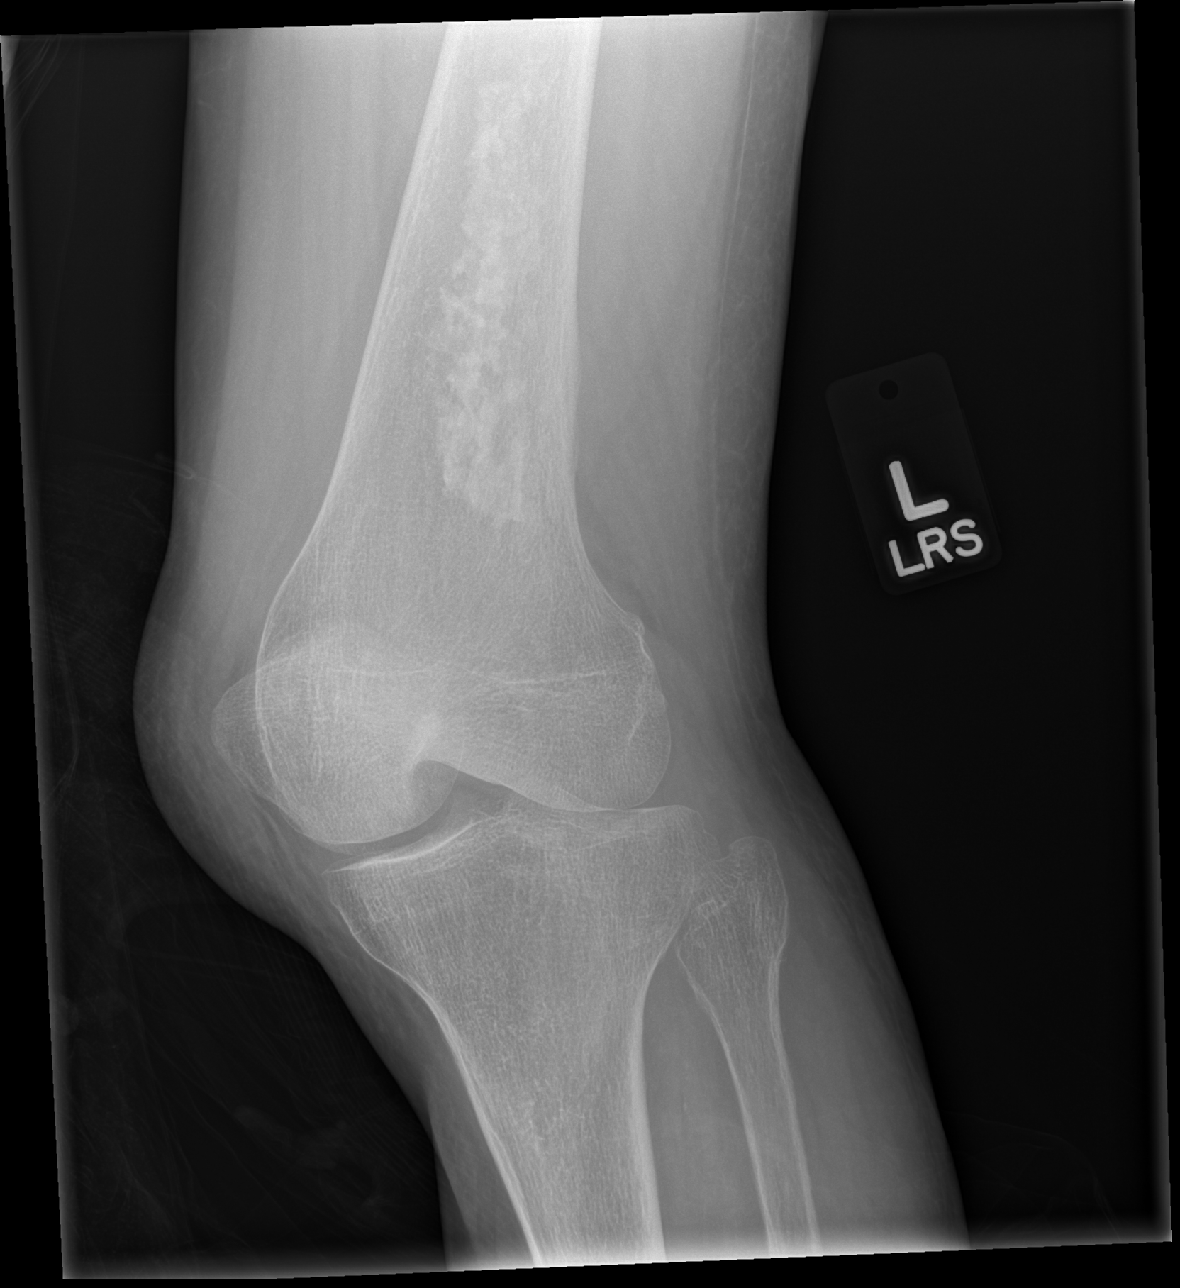

[x knee lat left (1 of 2)]
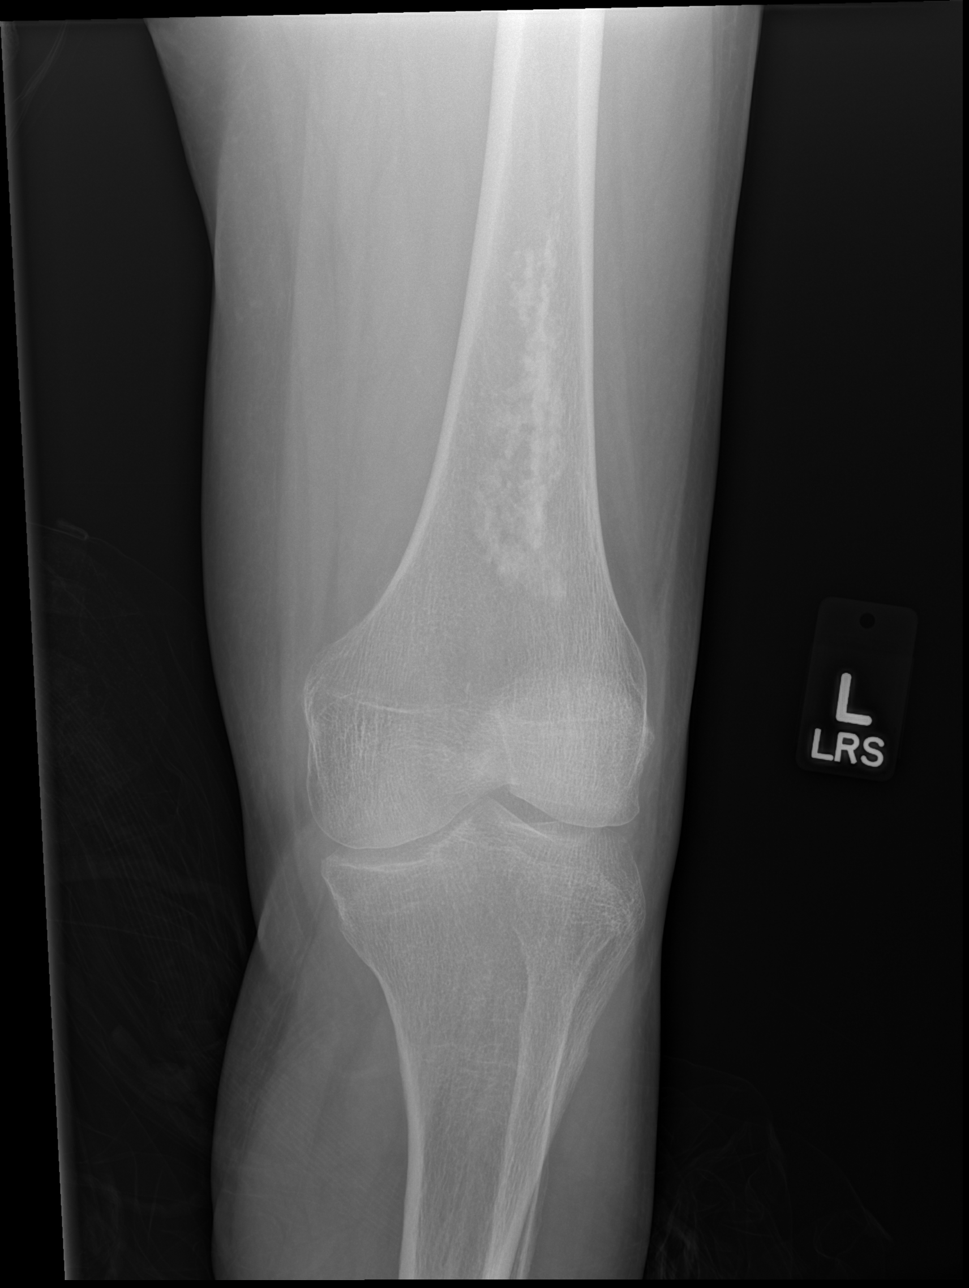

[x knee lat left (2 of 2)]
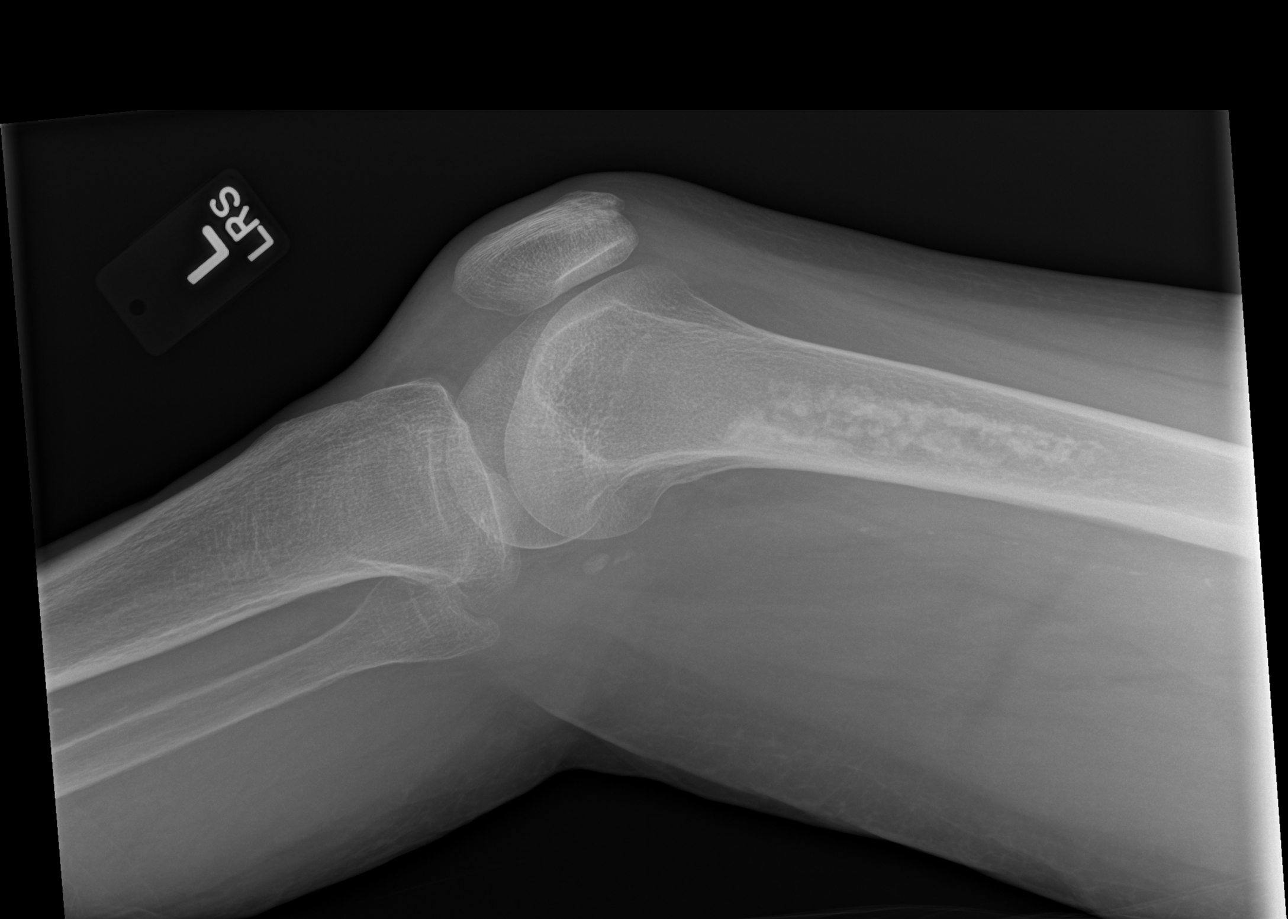

[4 of 4 positions shown; findings below may reference images not displayed]

FINDINGS: Intra medullary sclerosis demonstrated in the distal left femoral
shaft and femoral metaphysis. Appearance is consistent with
enchondroma versus bone infarct. No evidence of acute fracture or
dislocation in the left knee. No significant effusions. Mild
vascular calcifications noted.
IMPRESSION: Sclerosis in the distal left femur consistent with enchondroma or
bone infarct. No acute fracture or dislocation.

## 2017-05-27 IMAGING — CT CT HEAD W/O CM
2 series · 15 of 30 positions shown, 19 images · non-contrast
Comparison: Study obtained earlier in the day

CLINICAL DATA: Expressive a aphasia.  Recent subdural hemorrhage

EXAM:
CT HEAD WITHOUT CONTRAST
TECHNIQUE: Contiguous axial images were obtained from the base of the skull
through the vertex without intravenous contrast.

[Series 201: head w/o, idose (1) · axial · non-contrast · 0.49mm/px · z∈[+105,+235]mm · 13 of 32 slices shown, 17 images]
[im 3/32  brain]
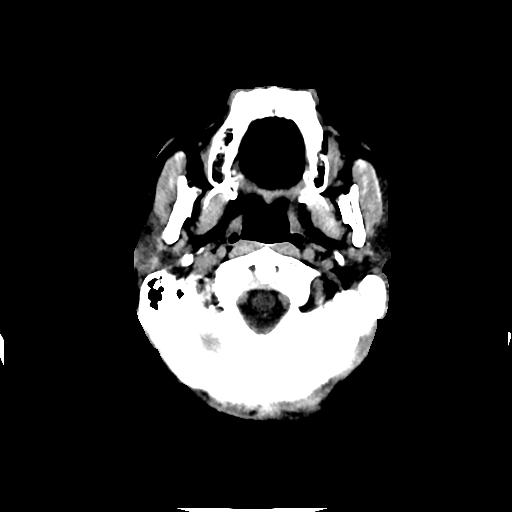
[im 3/32  bone]
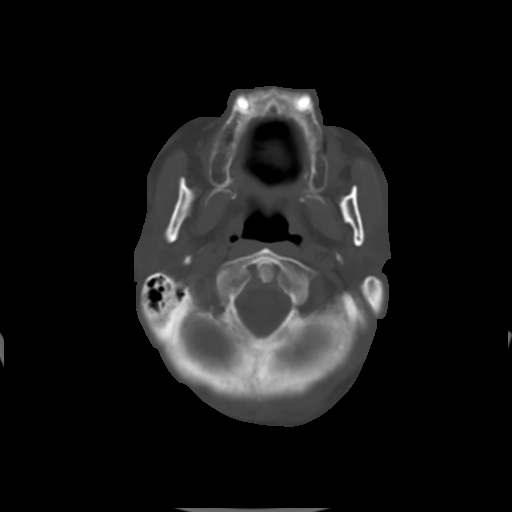
[im 5/32  brain]
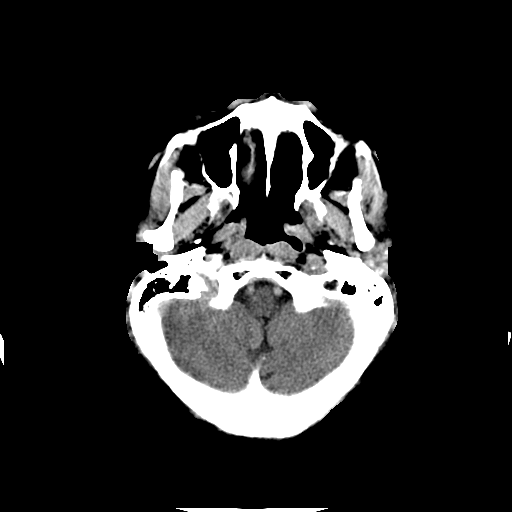
[im 7/32  brain]
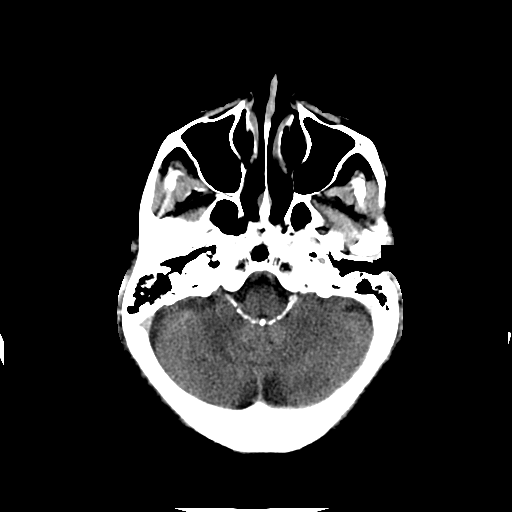
[im 9/32  brain]
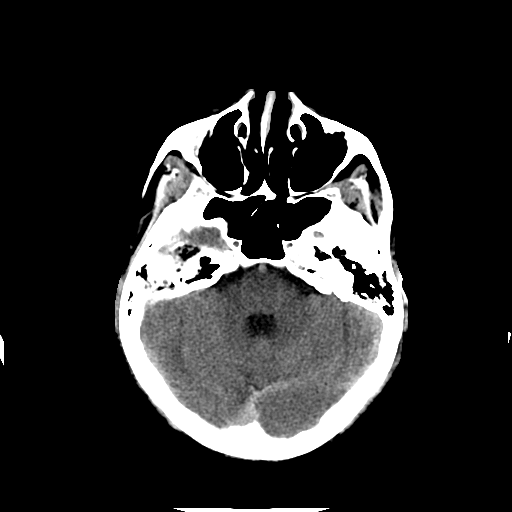
[im 12/32  brain]
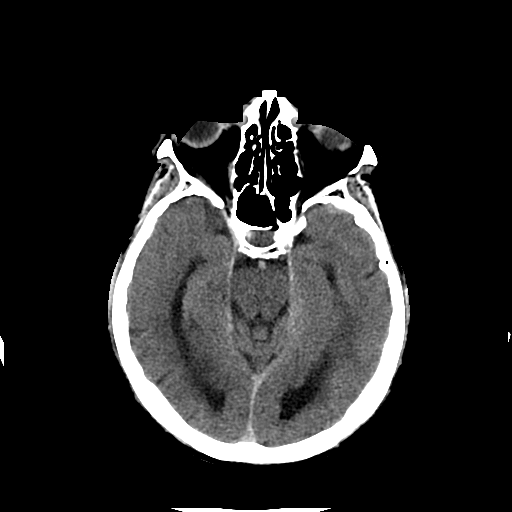
[im 12/32  bone]
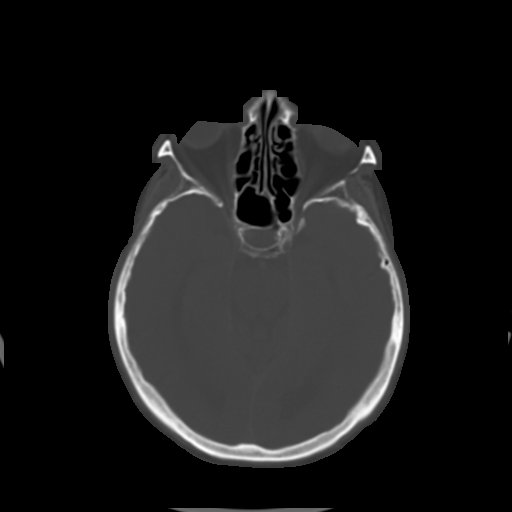
[im 14/32  brain]
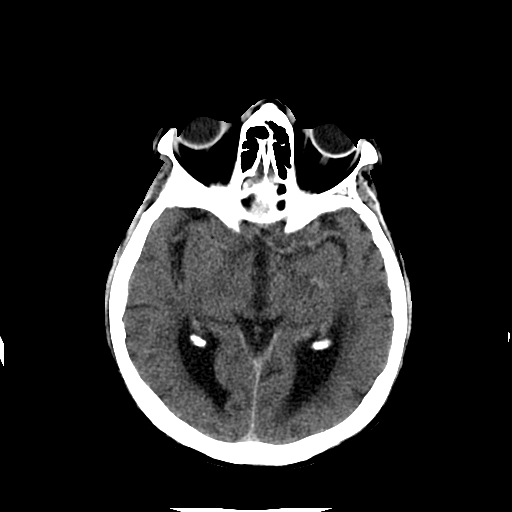
[im 16/32  brain]
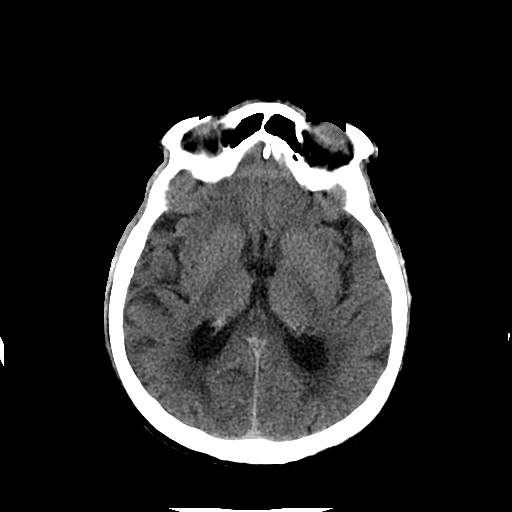
[im 18/32  brain]
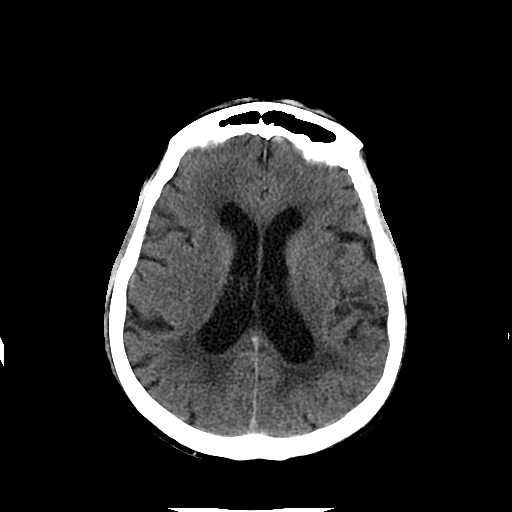
[im 20/32  brain]
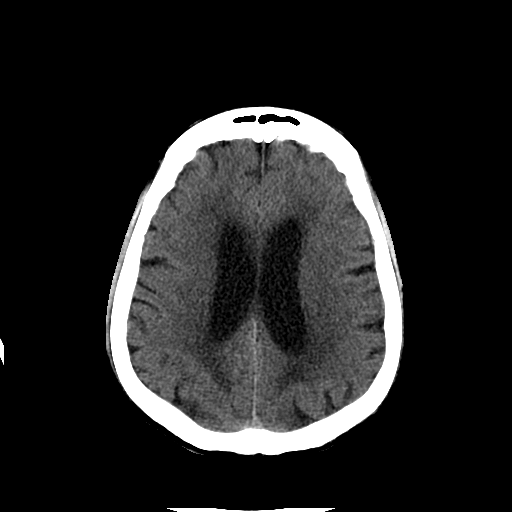
[im 20/32  bone]
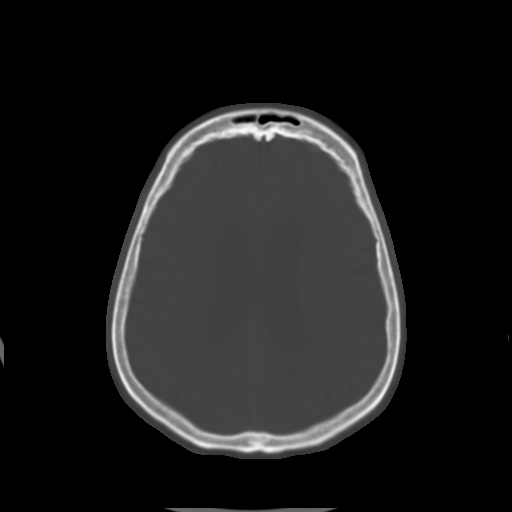
[im 23/32  brain]
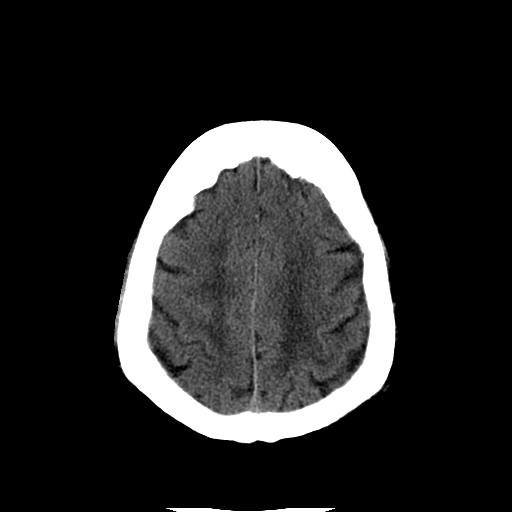
[im 25/32  brain]
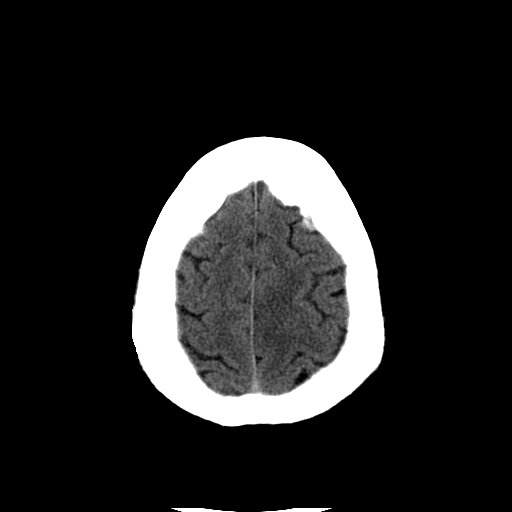
[im 27/32  brain]
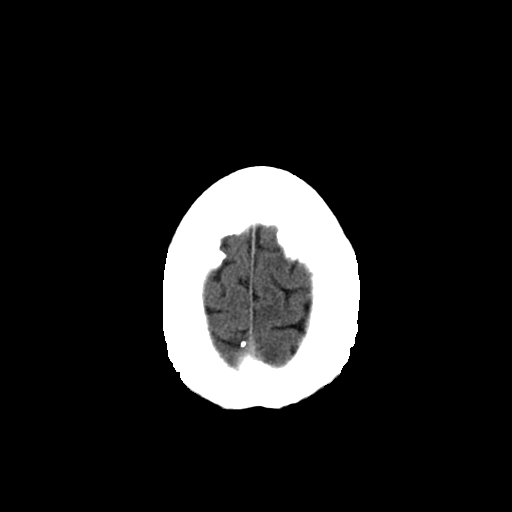
[im 29/32  brain]
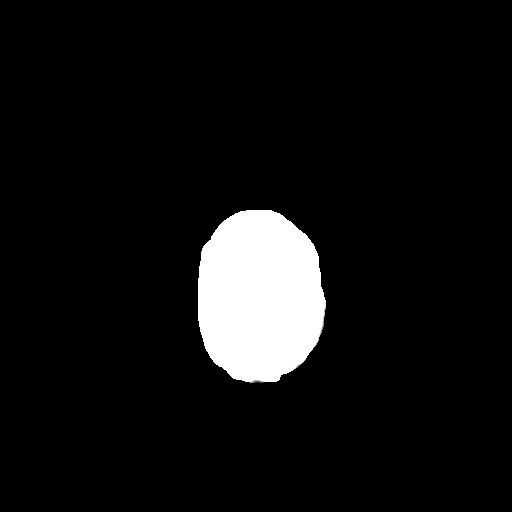
[im 29/32  bone]
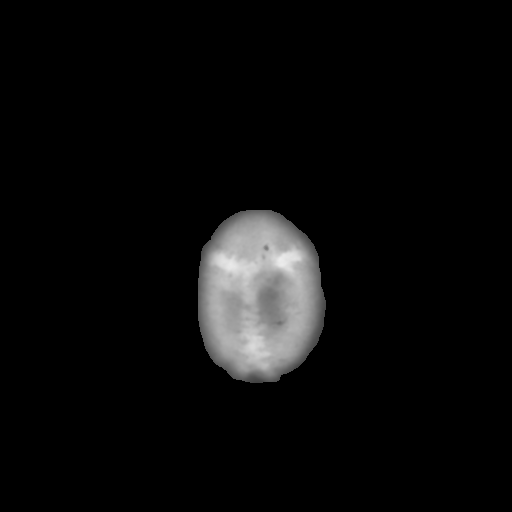

[Series 202: head w/o bone, idose (1) · axial · non-contrast · 0.49mm/px · z∈[+105,+125]mm · 2 of 32 slices shown]
[im 3/32  bone]
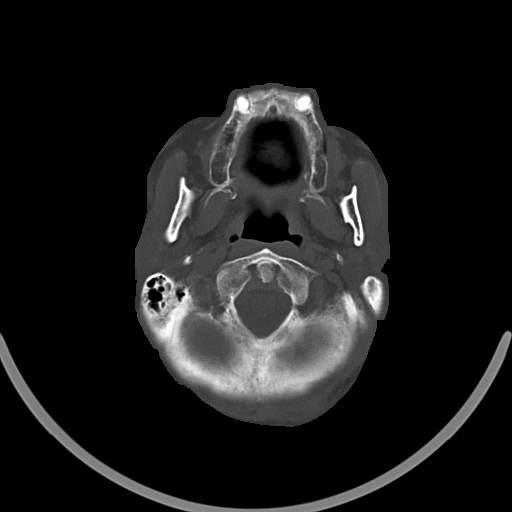
[im 7/32  bone]
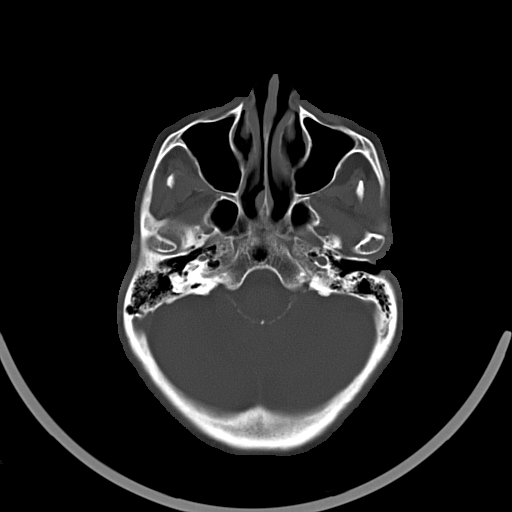

[15 of 30 positions shown; findings below may reference images not displayed]

FINDINGS: Mild diffuse atrophy is stable. There is a focal left
temporal-occipital junction subdural hematoma with maximum thickness
of 5 mm, unchanged from earlier in the day. There is subdural
hemorrhage in the left tentorium which is not causing appreciable
mass effect, stable. There is equivocal minimal subdural hematoma in
the right tentorium, stable. There is no new focus of hemorrhage.
There is no mass or midline shift. There is patchy small vessel
disease throughout the centra semiovale bilaterally, stable. No new
gray-white compartment lesion is identified. The bony calvarium
appears intact. The mastoid air cells are clear. Orbits appear
symmetric bilaterally.
IMPRESSION: Areas of subdural hematoma, stable from earlier in the day. No new
foci of hemorrhage. No significant mass effect and no midline shift
apparent. Atrophy with small vessel disease is stable. No acute
infarct evident.
